# Patient Record
Sex: Male | Born: 1955 | Race: White | Hispanic: No | Smoking: Current every day smoker
Health system: Southern US, Community
[De-identification: ages and names within clinical notes are randomized; demographics above are authoritative.]

## PROBLEM LIST (undated history)

## (undated) DIAGNOSIS — B1920 Unspecified viral hepatitis C without hepatic coma: Secondary | ICD-10-CM

## (undated) DIAGNOSIS — F1020 Alcohol dependence, uncomplicated: Secondary | ICD-10-CM

## (undated) DIAGNOSIS — F32A Depression, unspecified: Secondary | ICD-10-CM

## (undated) DIAGNOSIS — B182 Chronic viral hepatitis C: Secondary | ICD-10-CM

## (undated) DIAGNOSIS — J449 Chronic obstructive pulmonary disease, unspecified: Secondary | ICD-10-CM

## (undated) DIAGNOSIS — F329 Major depressive disorder, single episode, unspecified: Secondary | ICD-10-CM

---

## 2003-03-09 ENCOUNTER — Emergency Department (HOSPITAL_COMMUNITY): Admission: EM | Admit: 2003-03-09 | Discharge: 2003-03-09 | Payer: Self-pay | Admitting: Emergency Medicine

## 2008-06-09 ENCOUNTER — Emergency Department (HOSPITAL_COMMUNITY): Admission: EM | Admit: 2008-06-09 | Discharge: 2008-06-09 | Payer: Self-pay | Admitting: Emergency Medicine

## 2011-06-09 LAB — RAPID URINE DRUG SCREEN, HOSP PERFORMED
Amphetamines: NOT DETECTED
Barbiturates: NOT DETECTED
Benzodiazepines: NOT DETECTED
Cocaine: POSITIVE — AB

## 2011-06-09 LAB — ETHANOL: Alcohol, Ethyl (B): <5

## 2011-06-09 LAB — CBC
MCHC: 33.8
MCV: 94.6
Platelets: 195

## 2011-06-09 LAB — BASIC METABOLIC PANEL
BUN: 6
CO2: 27
Calcium: 9.5
Chloride: 100
Creatinine, Ser: 0.79

## 2012-10-07 ENCOUNTER — Emergency Department (HOSPITAL_COMMUNITY)
Admission: EM | Admit: 2012-10-07 | Discharge: 2012-10-07 | Disposition: A | Discharge status: Home / Self Care | Payer: Self-pay | Attending: Emergency Medicine | Admitting: Emergency Medicine

## 2012-10-07 ENCOUNTER — Encounter (HOSPITAL_COMMUNITY): Payer: Self-pay | Admitting: Emergency Medicine

## 2012-10-07 DIAGNOSIS — Z5900 Homelessness unspecified: Secondary | ICD-10-CM | POA: Insufficient documentation

## 2012-10-07 DIAGNOSIS — M549 Dorsalgia, unspecified: Secondary | ICD-10-CM

## 2012-10-07 DIAGNOSIS — M62838 Other muscle spasm: Secondary | ICD-10-CM

## 2012-10-07 DIAGNOSIS — F17200 Nicotine dependence, unspecified, uncomplicated: Secondary | ICD-10-CM | POA: Insufficient documentation

## 2012-10-07 DIAGNOSIS — M5380 Other specified dorsopathies, site unspecified: Secondary | ICD-10-CM | POA: Insufficient documentation

## 2012-10-07 MED ORDER — OXYCODONE-ACETAMINOPHEN 5-325 MG PO TABS
2.0000 | ORAL_TABLET | Freq: Once | ORAL | Status: AC
  Administered 2012-10-07: 2 via ORAL
  Filled 2012-10-07: qty 2

## 2012-10-07 MED ORDER — METHOCARBAMOL 500 MG PO TABS: 500.0000 mg | ORAL_TABLET | Freq: Two times a day (BID) | ORAL | Status: DC

## 2012-10-07 MED ORDER — HYDROCODONE-ACETAMINOPHEN 5-325 MG PO TABS
2.0000 | ORAL_TABLET | Freq: Four times a day (QID) | ORAL | Status: DC | PRN
Start: 2012-10-07 — End: 2012-10-15

## 2012-10-07 NOTE — ED Notes (Signed)
Per EMS, was moving logs last night and now has lower back pain, radiating to right leg

## 2012-10-07 NOTE — ED Provider Notes (Signed)
History     CSN: [id number]  Arrival date & time 10/07/12  1032   First MD Initiated Contact with Patient 10/07/12 1057      Chief Complaint  Patient presents with  . Back Pain    (Consider location/radiation/quality/duration/timing/severity/associated sxs/prior treatment) HPI Comments: Is a 57 year old male, no known past medical history, who presents to the emergency department with chief complaint of back pain. Patient is homeless, and states that he was lifting logs last night to maintain a camp fire. States that he was intoxicated at that time and that he hurt his back while lifting the logs. States that he awoke this morning with tight lumbar muscles, which are not relieved with anything. States that his pain is moderate to severe. His symptoms are aggravated with movement.  The history is provided by the patient. No language interpreter was used.    History reviewed. No pertinent past medical history.  History reviewed. No pertinent past surgical history.  No family history on file.  History  Substance Use Topics  . Smoking status: Current Every Day Smoker  . Smokeless tobacco: Not on file  . Alcohol Use: Yes      Review of Systems  All other systems reviewed and are negative.    Allergies  Review of patient's allergies indicates no known allergies.  Home Medications   Current Outpatient Rx  Name  Route  Sig  Dispense  Refill  . HYDROCODONE-ACETAMINOPHEN 5-325 MG PO TABS   Oral   Take 2 tablets by mouth every 6 (six) hours as needed for pain.   10 tablet   0   . METHOCARBAMOL 500 MG PO TABS   Oral   Take 1 tablet (500 mg total) by mouth 2 (two) times daily.   20 tablet   0     BP 130/78  Pulse 95  Temp 98.3 F (36.8 C) (Oral)  Resp 18  SpO2 100%  Physical Exam  Nursing note and vitals reviewed. Constitutional: He is oriented to person, place, and time. He appears well-developed and well-nourished.  HENT:  Head: Normocephalic and  atraumatic.  Eyes: Conjunctivae normal and EOM are normal.  Neck: Normal range of motion.  Cardiovascular: Normal rate.   Pulmonary/Chest: Effort normal.  Abdominal: He exhibits no distension.  Musculoskeletal: Normal range of motion.       No spine tenderness, lumbar paraspinal muscles are tender to palpation, no acute deformity, rash, or signs of infection, range of motion is full, gait is appropriate  Neurological: He is alert and oriented to person, place, and time.  Skin: Skin is dry.  Psychiatric: He has a normal mood and affect. His behavior is normal. Judgment and thought content normal.    ED Course  Procedures (including critical care time)  Labs Reviewed - No data to display No results found.   1. Back pain   2. Muscle spasm       MDM  57 year old male with lumbar back strain/muscle spasm. Will treat the patient with some pain pills and Robaxin. Encouraged the patient to ice the affected area. Have given the patient exercises that he can stretch his back with. He does not have any signs of cauda equina. He is ambulatory. I'm going to discharge the patient back to his camp site. Patient understands and agrees with the plan. He is stable and ready for discharge.       Roxy Horseman, PA-C 10/07/12 1226

## 2012-10-07 NOTE — ED Provider Notes (Signed)
Medical screening examination/treatment/procedure(s) were performed by non-physician practitioner and as supervising physician I was immediately available for consultation/collaboration.   Flint Melter, MD 10/07/12 [phone]

## 2012-10-15 ENCOUNTER — Emergency Department (HOSPITAL_COMMUNITY): Payer: Self-pay

## 2012-10-15 ENCOUNTER — Encounter (HOSPITAL_COMMUNITY): Payer: Self-pay | Admitting: Emergency Medicine

## 2012-10-15 ENCOUNTER — Inpatient Hospital Stay (HOSPITAL_COMMUNITY)
Admission: EM | Admit: 2012-10-15 | Discharge: 2012-10-17 | DRG: 195 | Disposition: A | Discharge status: Home / Self Care | Payer: MEDICAID | Attending: Family Medicine | Admitting: Family Medicine

## 2012-10-15 DIAGNOSIS — J189 Pneumonia, unspecified organism: Secondary | ICD-10-CM

## 2012-10-15 DIAGNOSIS — R52 Pain, unspecified: Secondary | ICD-10-CM

## 2012-10-15 DIAGNOSIS — F1020 Alcohol dependence, uncomplicated: Secondary | ICD-10-CM

## 2012-10-15 DIAGNOSIS — Z791 Long term (current) use of non-steroidal anti-inflammatories (NSAID): Secondary | ICD-10-CM

## 2012-10-15 DIAGNOSIS — R0902 Hypoxemia: Secondary | ICD-10-CM

## 2012-10-15 DIAGNOSIS — F17200 Nicotine dependence, unspecified, uncomplicated: Secondary | ICD-10-CM | POA: Diagnosis present

## 2012-10-15 DIAGNOSIS — Z79899 Other long term (current) drug therapy: Secondary | ICD-10-CM

## 2012-10-15 DIAGNOSIS — R222 Localized swelling, mass and lump, trunk: Secondary | ICD-10-CM | POA: Diagnosis present

## 2012-10-15 DIAGNOSIS — Z5900 Homelessness unspecified: Secondary | ICD-10-CM

## 2012-10-15 DIAGNOSIS — F1210 Cannabis abuse, uncomplicated: Secondary | ICD-10-CM | POA: Diagnosis present

## 2012-10-15 DIAGNOSIS — R0789 Other chest pain: Secondary | ICD-10-CM | POA: Diagnosis present

## 2012-10-15 DIAGNOSIS — K5900 Constipation, unspecified: Secondary | ICD-10-CM

## 2012-10-15 DIAGNOSIS — Z720 Tobacco use: Secondary | ICD-10-CM

## 2012-10-15 DIAGNOSIS — F1010 Alcohol abuse, uncomplicated: Secondary | ICD-10-CM

## 2012-10-15 DIAGNOSIS — F1410 Cocaine abuse, uncomplicated: Secondary | ICD-10-CM | POA: Diagnosis present

## 2012-10-15 HISTORY — DX: Alcohol dependence, uncomplicated: F10.20

## 2012-10-15 HISTORY — DX: Pneumonia, unspecified organism: J18.9

## 2012-10-15 LAB — POCT I-STAT, CHEM 8
Calcium, Ion: 1.21 mmol/L (ref 1.12–1.23)
Creatinine, Ser: 0.7 mg/dL (ref 0.50–1.35)
Glucose, Bld: 109 mg/dL — ABNORMAL HIGH (ref 70–99)
Hemoglobin: 17 g/dL (ref 13.0–17.0)
Potassium: 4.6 mEq/L (ref 3.5–5.1)
TCO2: 26 mmol/L (ref 0–100)

## 2012-10-15 LAB — COMPREHENSIVE METABOLIC PANEL
ALT: 101 U/L — ABNORMAL HIGH (ref 0–53)
AST: 73 U/L — ABNORMAL HIGH (ref 0–37)
Albumin: 3.7 g/dL (ref 3.5–5.2)
Alkaline Phosphatase: 84 U/L (ref 39–117)
Chloride: 100 mEq/L (ref 96–112)
Potassium: 4.6 mEq/L (ref 3.5–5.1)
Sodium: 137 mEq/L (ref 135–145)
Total Bilirubin: 0.4 mg/dL (ref 0.3–1.2)

## 2012-10-15 LAB — URINALYSIS, MICROSCOPIC ONLY
Bilirubin Urine: NEGATIVE
Hgb urine dipstick: NEGATIVE
Ketones, ur: NEGATIVE mg/dL
Protein, ur: NEGATIVE mg/dL
Specific Gravity, Urine: 1.034 — ABNORMAL HIGH (ref 1.005–1.030)
Urobilinogen, UA: 1 mg/dL (ref 0.0–1.0)

## 2012-10-15 LAB — CBC
Platelets: 229 10*3/uL (ref 150–400)
RBC: 4.86 MIL/uL (ref 4.22–5.81)
RDW: 13.3 % (ref 11.5–15.5)
WBC: 8.1 10*3/uL (ref 4.0–10.5)

## 2012-10-15 LAB — RAPID URINE DRUG SCREEN, HOSP PERFORMED
Amphetamines: NOT DETECTED
Benzodiazepines: NOT DETECTED
Cocaine: NOT DETECTED
Opiates: NOT DETECTED
Tetrahydrocannabinol: NOT DETECTED

## 2012-10-15 MED ORDER — DOCUSATE SODIUM 100 MG PO CAPS
100.0000 mg | ORAL_CAPSULE | Freq: Two times a day (BID) | ORAL | Status: DC
  Administered 2012-10-15 – 2012-10-17 (×4): 100 mg via ORAL
  Filled 2012-10-15 (×6): qty 1

## 2012-10-15 MED ORDER — IPRATROPIUM BROMIDE 0.02 % IN SOLN: 0.5000 mg | RESPIRATORY_TRACT | Status: DC | PRN

## 2012-10-15 MED ORDER — LEVOFLOXACIN IN D5W 750 MG/150ML IV SOLN
750.0000 mg | Freq: Once | INTRAVENOUS | Status: AC
Start: 2012-10-15 — End: 2012-10-15
  Administered 2012-10-15: 750 mg via INTRAVENOUS
  Filled 2012-10-15: qty 150

## 2012-10-15 MED ORDER — SODIUM CHLORIDE 0.9 % IV SOLN
INTRAVENOUS | Status: DC
  Administered 2012-10-15 – 2012-10-16 (×2): via INTRAVENOUS

## 2012-10-15 MED ORDER — ACETAMINOPHEN 650 MG RE SUPP: 650.0000 mg | Freq: Four times a day (QID) | RECTAL | Status: DC | PRN

## 2012-10-15 MED ORDER — OXYCODONE HCL 5 MG PO TABS
5.0000 mg | ORAL_TABLET | ORAL | Status: DC | PRN
  Administered 2012-10-16 – 2012-10-17 (×2): 5 mg via ORAL
  Filled 2012-10-15 (×2): qty 1

## 2012-10-15 MED ORDER — HYDROMORPHONE HCL PF 1 MG/ML IJ SOLN
0.5000 mg | INTRAMUSCULAR | Status: DC | PRN
  Administered 2012-10-16: 0.5 mg via INTRAVENOUS
  Filled 2012-10-15: qty 1

## 2012-10-15 MED ORDER — LEVOFLOXACIN 750 MG PO TABS
750.0000 mg | ORAL_TABLET | Freq: Every day | ORAL | Status: DC
  Filled 2012-10-15: qty 1

## 2012-10-15 MED ORDER — ALBUTEROL SULFATE (5 MG/ML) 0.5% IN NEBU
5.0000 mg | INHALATION_SOLUTION | Freq: Once | RESPIRATORY_TRACT | Status: AC
  Administered 2012-10-15: 5 mg via RESPIRATORY_TRACT
  Filled 2012-10-15: qty 1

## 2012-10-15 MED ORDER — LORAZEPAM 2 MG/ML IJ SOLN: 1.0000 mg | Freq: Four times a day (QID) | INTRAMUSCULAR | Status: DC | PRN

## 2012-10-15 MED ORDER — THIAMINE HCL 100 MG/ML IJ SOLN
100.0000 mg | Freq: Every day | INTRAMUSCULAR | Status: DC
  Filled 2012-10-15 (×3): qty 1

## 2012-10-15 MED ORDER — ACETAMINOPHEN 325 MG PO TABS
650.0000 mg | ORAL_TABLET | Freq: Four times a day (QID) | ORAL | Status: DC | PRN
  Administered 2012-10-16 – 2012-10-17 (×2): 650 mg via ORAL
  Filled 2012-10-15 (×2): qty 2

## 2012-10-15 MED ORDER — FOLIC ACID 1 MG PO TABS
1.0000 mg | ORAL_TABLET | Freq: Every day | ORAL | Status: DC
  Administered 2012-10-15 – 2012-10-17 (×3): 1 mg via ORAL
  Filled 2012-10-15 (×3): qty 1

## 2012-10-15 MED ORDER — VITAMIN B-1 100 MG PO TABS
100.0000 mg | ORAL_TABLET | Freq: Every day | ORAL | Status: DC
  Administered 2012-10-15 – 2012-10-17 (×3): 100 mg via ORAL
  Filled 2012-10-15 (×3): qty 1

## 2012-10-15 MED ORDER — ADULT MULTIVITAMIN W/MINERALS CH
1.0000 | ORAL_TABLET | Freq: Every day | ORAL | Status: DC
  Administered 2012-10-15 – 2012-10-17 (×3): 1 via ORAL
  Filled 2012-10-15 (×3): qty 1

## 2012-10-15 MED ORDER — LORAZEPAM 1 MG PO TABS
1.0000 mg | ORAL_TABLET | Freq: Four times a day (QID) | ORAL | Status: DC | PRN
  Administered 2012-10-15 – 2012-10-16 (×2): 1 mg via ORAL
  Filled 2012-10-15 (×2): qty 1

## 2012-10-15 MED ORDER — SENNA 8.6 MG PO TABS
1.0000 | ORAL_TABLET | Freq: Two times a day (BID) | ORAL | Status: DC
  Administered 2012-10-15 – 2012-10-16 (×2): 8.6 mg via ORAL
  Filled 2012-10-15 (×3): qty 1

## 2012-10-15 MED ORDER — IOHEXOL 350 MG/ML SOLN
100.0000 mL | Freq: Once | INTRAVENOUS | Status: AC | PRN
  Administered 2012-10-15: 100 mL via INTRAVENOUS

## 2012-10-15 MED ORDER — ENOXAPARIN SODIUM 40 MG/0.4ML ~~LOC~~ SOLN
40.0000 mg | SUBCUTANEOUS | Status: DC
  Administered 2012-10-15 – 2012-10-16 (×2): 40 mg via SUBCUTANEOUS
  Filled 2012-10-15 (×3): qty 0.4

## 2012-10-15 MED ORDER — METHYLPREDNISOLONE SODIUM SUCC 125 MG IJ SOLR
125.0000 mg | Freq: Once | INTRAMUSCULAR | Status: AC
  Administered 2012-10-15: 125 mg via INTRAVENOUS
  Filled 2012-10-15: qty 2

## 2012-10-15 MED ORDER — HYDROMORPHONE HCL PF 1 MG/ML IJ SOLN
1.0000 mg | INTRAMUSCULAR | Status: DC | PRN
  Administered 2012-10-15: 1 mg via INTRAVENOUS
  Filled 2012-10-15: qty 1

## 2012-10-15 MED ORDER — HYDROMORPHONE HCL PF 1 MG/ML IJ SOLN: 1.0000 mg | INTRAMUSCULAR | Status: DC | PRN

## 2012-10-15 MED ORDER — ALBUTEROL SULFATE (5 MG/ML) 0.5% IN NEBU: 2.5000 mg | INHALATION_SOLUTION | RESPIRATORY_TRACT | Status: DC | PRN

## 2012-10-15 MED ORDER — CYCLOBENZAPRINE HCL 10 MG PO TABS
10.0000 mg | ORAL_TABLET | Freq: Three times a day (TID) | ORAL | Status: DC | PRN
  Filled 2012-10-15: qty 1

## 2012-10-15 MED ORDER — LEVOFLOXACIN 750 MG PO TABS
750.0000 mg | ORAL_TABLET | ORAL | Status: DC
  Administered 2012-10-16 – 2012-10-17 (×2): 750 mg via ORAL
  Filled 2012-10-15 (×3): qty 1

## 2012-10-15 NOTE — ED Notes (Signed)
Bed:WA04<BR> Expected date:<BR> Expected time:<BR> Means of arrival:<BR> Comments:<BR>

## 2012-10-15 NOTE — ED Notes (Signed)
Attempted to call report to floor.  RN unable to take report.  

## 2012-10-15 NOTE — ED Notes (Signed)
Pt moved to room 4.

## 2012-10-15 NOTE — ED Provider Notes (Signed)
History     CSN: [id number]  Arrival date & time 10/15/12  0156   First MD Initiated Contact with Patient 10/15/12 0559      No chief complaint on file.   (Consider location/radiation/quality/duration/timing/severity/associated sxs/prior treatment) HPI HX per PT, R sided flank/ back pain with SOB, cough and wheezing. Onset a week ago and persistent despite flexeril.  No CP. No F/C, no rash, mod to severe non radiating pain. Somewhat better with rest.  No past medical history on file.  No past surgical history on file.  No family history on file.  History  Substance Use Topics  . Smoking status: Current Every Day Smoker  . Smokeless tobacco: Not on file  . Alcohol Use: Yes      Review of Systems  Constitutional: Negative for fever and chills.  HENT: Negative for neck pain and neck stiffness.   Eyes: Negative for pain.  Respiratory: Positive for shortness of breath.   Cardiovascular: Negative for chest pain and leg swelling.  Gastrointestinal: Negative for vomiting and abdominal pain.  Genitourinary: Negative for dysuria.  Musculoskeletal: Positive for back pain.  Skin: Negative for rash.  Neurological: Negative for headaches.  All other systems reviewed and are negative.    Allergies  Review of patient's allergies indicates no known allergies.  Home Medications   Current Outpatient Rx  Name  Route  Sig  Dispense  Refill  . HYDROcodone-acetaminophen (NORCO/VICODIN) 5-325 MG per tablet   Oral   Take 2 tablets by mouth every 6 (six) hours as needed for pain.   10 tablet   0   . methocarbamol (ROBAXIN) 500 MG tablet   Oral   Take 1 tablet (500 mg total) by mouth 2 (two) times daily.   20 tablet   0     There were no vitals taken for this visit.  Physical Exam  Constitutional: He is oriented to person, place, and time. He appears well-developed and well-nourished.  HENT:  Head: Normocephalic and atraumatic.  Eyes: EOM are normal. Pupils are equal,  round, and reactive to light.  Neck: Neck supple.  Cardiovascular: Regular rhythm and intact distal pulses.   tachycardic  Pulmonary/Chest: Effort normal. No respiratory distress.  bilat exp wheezes and mild tachypnea  Abdominal: Soft. Bowel sounds are normal. He exhibits no distension. There is no tenderness.  Musculoskeletal: Normal range of motion. He exhibits no edema.  Neurological: He is alert and oriented to person, place, and time.  Skin: Skin is warm and dry.    ED Course  Procedures (including critical care time)  Results for orders placed during the hospital encounter of 10/15/12  CBC      Result Value Range   WBC 8.1  4.0 - 10.5 K/uL   RBC 4.86  4.22 - 5.81 MIL/uL   Hemoglobin 16.2  13.0 - 17.0 g/dL   HCT 78.2  95.6 - 21.3 %   MCV 94.2  78.0 - 100.0 fL   MCH 33.3  26.0 - 34.0 pg   MCHC 35.4  30.0 - 36.0 g/dL   RDW 08.6  57.8 - 46.9 %   Platelets 229  150 - 400 K/uL  POCT I-STAT, CHEM 8      Result Value Range   Sodium 141  135 - 145 mEq/L   Potassium 4.6  3.5 - 5.1 mEq/L   Chloride 109  96 - 112 mEq/L   BUN 13  6 - 23 mg/dL   Creatinine, Ser 6.29  0.50 -  1.35 mg/dL   Glucose, Bld 161 (*) 70 - 99 mg/dL   Calcium, Ion 0.96  0.45 - 1.23 mmol/L   TCO2 26  0 - 100 mmol/L   Hemoglobin 17.0  13.0 - 17.0 g/dL   HCT 40.9  81.1 - 91.4 %   RLL infilt versus mass on Ct A/P - sent for CT chest with contrast to further evaluate.     1. CAP (community acquired pneumonia)   2. Hypoxia    Pulse improved with supp O2  IV ABx, steroids and albuterol. Iv Dilaudid pain control  8:15 AM MED c/s DR Tat to evaluate for admit  MDM  Hypoxia, pain and wheezing with infiltrate on imaging, treated for CAP, plan MED admit        Sunnie Nielsen, MD 10/15/12 [phone]

## 2012-10-15 NOTE — H&P (Addendum)
[facility] History and Physical  JOAQUIM TOLEN VHQ:[id number] DOB: 1956-07-31 DOA: 10/15/2012   PCP: No primary provider on file.   Chief Complaint: right flank pain, cough  HPI:  57 year old male with a history of homelessness, polysubstance abuse including tobacco, alcohol, cocaine, and cannabis presents to the hospital with a two-day history of worsening right flank, posterior rib pain. The patient came to the emergency department on 10/07/2012 with right lower back pain in the sacroiliac region. He was discharged with Flexeril and some type of opioid. He was only able to get the Flexeril. Since that time, the patient stated that his right sacroiliac pain has improved , but has developed a new right posterior rib/flank pain. He denies any recent injuries or trauma. Patient also complains of a one-week history of coughing with occasional white sputum. He denies any hemoptysis. He has had some intermittent shortness of breath, but denies any chest discomfort, dizziness, syncope. He denies any fevers, chills, headache, visual loss, nausea, vomiting, abdominal pain, diarrhea, dysuria, hematuria. In the emergency department, CT of the abdomen and pelvis was obtained and showed right lower lobe consolidation with air bronchograms with consideration of possible mass. It also showed prominent stool and gas. A followup CT angiogram of the chest was obtained which was negative for pulmonary embolus, but showed right lower lobe opacity/consolidation with occlusion of the segmental bronchus, favored by secretions. There was no mediastinal lymphadenopathy. The patient was given levofloxacin, Solu-Medrol, and albuterol. Assessment/Plan: Community acquired pneumonia -CURB65 score is zero -continue levofloxacin -Urine Legionella antigen, urine Streptococcus pneumoniae antigen -IV fluids -Blood cultures were not performed prior to antibiotics being given -Supplemental oxygen--oxygen saturation 91-92% on  room air Uncontrolled pain -Question if pain is related to patient's pneumonitis versus musculoskeletal -No rib injuries or lytic lesions noted on imaging -IV hydromorphone when necessary severe pain -Oxycodone when necessary moderate pain -Check UA and urine culture -Check EKG Polysubstance abuse -Placed the patient on alcohol withdrawal protocol -Drinks 3-8 beers per day x40 years -Urine drug screen Tobacco use -One pack per day x40 years -Cessation discussed -Albuterol and Atrovent when necessary shortness of breath Constipation -Colace and Senokot for now Disposition -Anticipate discharge in 24 hours if medically stable -Will likely need assistance with medications   Past medical history: Denies history of hypertension, diabetes, cancer. No other major chronic medical problems Past surgical history: Left knee arthroscopy   Social History:  reports that he has been smoking.  He does not have any smokeless tobacco history on file. He reports that  drinks alcohol. His drug history is not on file. History of cocaine and cannabis use  Family history: Mother is alive and healthy; father died at age 34 from carbon monoxide poisoning  No Known Allergies    Prior to Admission medications   Medication Sig Start Date End Date Taking? Authorizing Provider  cyclobenzaprine (FLEXERIL) 10 MG tablet Take 10 mg by mouth 3 (three) times daily as needed for muscle spasms.   Yes Historical Provider, MD  ibuprofen (ADVIL,MOTRIN) 200 MG tablet Take 600-800 mg by mouth every 6 (six) hours as needed for pain.   Yes Historical Provider, MD    Review of Systems:  Constitutional:  No weight loss, night sweats, Fevers, chills, fatigue.  Head&Eyes: No headache.  No vision loss.  No eye pain or scotoma ENT:  No Difficulty swallowing,Tooth/dental problems,Sore throat,  No ear ache, post nasal drip,  Cardio-vascular:  No chest pain, Orthopnea, PND, swelling in lower extremities,  dizziness,  palpitations  GI:  No  abdominal pain, nausea, vomiting, diarrhea, loss of appetite, hematochezia, melena, heartburn, indigestion, Resp:  No shortness of breath with exertion or at rest. No coughing up of blood . Skin:  no rash or lesions.  GU:  no dysuria, change in color of urine, no urgency or frequency. No flank pain.  Musculoskeletal:  Complains of low back pain and flank pain on the right Psych:  No change in mood or affect. No depression or anxiety. Neurologic: No headache, no dysesthesia, no focal weakness, no vision loss. No syncope  Physical Exam: Filed Vitals:   10/15/12 0632 10/15/12 0652  BP:  124/75  Pulse:  86  Resp:  21  SpO2: 92% 92%   General:  A&O x 3, NAD, nontoxic, pleasant/cooperative Head/Eye: No conjunctival hemorrhage, no icterus, [place]/AT, No nystagmus ENT:  No icterus,  No thrush, poor dentition, no pharyngeal exudate Neck:  No masses, no lymphadenpathy, no bruits CV:  RRR, no rub, no gallop, no S3 Lung:  Diminished breath sounds in the right base. No wheezes or rhonchi. Good air movement. Abdomen: soft/NT, +BS, nondistended, no peritoneal signs Ext: No cyanosis, No rashes, No petechiae, No lymphangitis, No edema   Labs on Admission:  Basic Metabolic Panel:  Recent Labs Lab 10/15/12 0655 10/15/12 0707  NA 137 141  K 4.6 4.6  CL 100 109  CO2 24  --   GLUCOSE 105* 109*  BUN 12 13  CREATININE 0.57 0.70  CALCIUM 9.3  --    Liver Function Tests:  Recent Labs Lab 10/15/12 0655  AST 73*  ALT 101*  ALKPHOS 84  BILITOT 0.4  PROT 8.3  ALBUMIN 3.7   No results found for this basename: LIPASE, AMYLASE,  in the last 168 hours No results found for this basename: AMMONIA,  in the last 168 hours CBC:  Recent Labs Lab 10/15/12 0655 10/15/12 0707  WBC 8.1  --   HGB 16.2 17.0  HCT 45.8 50.0  MCV 94.2  --   PLT 229  --    Cardiac Enzymes: No results found for this basename: CKTOTAL, CKMB, CKMBINDEX, TROPONINI,  in the last 168  hours BNP: No components found with this basename: POCBNP,  CBG: No results found for this basename: GLUCAP,  in the last 168 hours  Radiological Exams on Admission: Ct Abdomen Pelvis Wo Contrast  10/15/2012  *RADIOLOGY REPORT*  Clinical Data:  Back pain.  CT ABDOMEN AND PELVIS WITHOUT CONTRAST  Technique:  Multidetector CT imaging of the abdomen and pelvis was performed following the standard protocol without intravenous contrast.  Comparison:   None.  Findings:  Focal consolidation or atelectasis in the right lung base with air bronchograms. This measures 3.5 x 1.8 cm. Mild associated pleural thickening.  This may represent focal acute pneumonia or mass lesion and follow up after resolution of acute symptoms is recommended.  The kidneys appear symmetrical in size and shape.  No pyelocaliectasis or ureterectasis.  No renal, ureteral, or bladder stones.  The bladder wall is not thickened.  The unenhanced appearance of the liver, spleen, gallbladder, pancreas, adrenal glands, abdominal aorta, and retroperitoneal lymph nodes are unremarkable except for vascular calcifications. The stomach and small bowel are decompressed.  Mild prominence of gas and stool filled colon suggesting constipation or ileus.  No free air or free fluid in the abdomen.  Pelvis:  Prostate gland is not enlarged but contains calcifications.  No free or loculated pelvic fluid collections.  No evidence of diverticulitis.  The appendix is normal.  Degenerative changes in the lumbar spine.  IMPRESSION: Focal consolidation or mass in the right lung base posteriorly. Follow up after resolution of acute symptoms is recommended.  No renal or ureteral stone or obstruction.  Mild prominence of gas and stool filled colon suggesting constipation or ileus.   Original Report Authenticated By: Burman Nieves, M.D.    Ct Angio Chest Pe W/cm &/or Wo Cm  10/15/2012  *RADIOLOGY REPORT*  Clinical Data: 57 year old male shortness of breath and chest pain.   CT ANGIOGRAPHY CHEST  Technique:  Multidetector CT imaging of the chest using the standard protocol during bolus administration of intravenous contrast. Multiplanar reconstructed images including MIPs were obtained and reviewed to evaluate the vascular anatomy.  Contrast:  100 ml Omnipaque 350.  Comparison: CT abdomen and pelvis without contrast earlier the same day.  Findings: Adequate contrast bolus timing in the pulmonary arterial tree.  Mild respiratory motion artifact.  No central pulmonary artery filling defect. No focal filling defect identified in the pulmonary arterial tree to suggest the presence of acute pulmonary embolism.  Small volume retained secretions in the trachea.  Central airway is otherwise are patent.  Right lower lobe bronchi are occluded and there is a stable right lower lobe medial basal segment peribronchovascular opacity / consolidation.  Superimposed mild dependent atelectasis.  No pericardial or pleural effusion.  No mediastinal lymphadenopathy.  Negative thoracic inlet.  Calcified atherosclerosis of the aorta and its branches.  Stable visible upper abdominal viscera.  No acute osseous abnormality identified.  IMPRESSION: 1. No evidence of acute pulmonary embolus. 2.  Right lower lobe medial basal segment bronchopneumonia. Segmental bronchus occluded, favor by secretions.  Small volume retained secretions also in the trachea.  No mediastinal lymphadenopathy or pleural effusion.   Original Report Authenticated By: Erskine Speed, M.D.     EKG: Independently reviewed. pending    Time spent:70 minutes Code Status:   full Family Communication:   Family at bedside   Zubin Pontillo, DO  [facility] Pager [phone]  If 7PM-7AM, please contact night-coverage [email or web address] Password [facility] 10/15/2012, 8:54 AM

## 2012-10-16 DIAGNOSIS — R52 Pain, unspecified: Secondary | ICD-10-CM

## 2012-10-16 DIAGNOSIS — K5900 Constipation, unspecified: Secondary | ICD-10-CM

## 2012-10-16 LAB — CBC
Hemoglobin: 14.6 g/dL (ref 13.0–17.0)
MCHC: 35.2 g/dL (ref 30.0–36.0)
RDW: 13.3 % (ref 11.5–15.5)

## 2012-10-16 LAB — BASIC METABOLIC PANEL
BUN: 14 mg/dL (ref 6–23)
GFR calc Af Amer: >90 mL/min (ref >90)
GFR calc non Af Amer: >90 mL/min (ref >90)
Potassium: 4.3 mEq/L (ref 3.5–5.1)

## 2012-10-16 NOTE — Progress Notes (Signed)
[facility] PROGRESS NOTE  Michael Patton WJX:[id number] DOB: 02-17-1956 DOA: 10/15/2012 PCP: No primary provider on file.  Assessment/Plan: Community acquired pneumonia  -CURB65 score is zero  -continue levofloxacin  -Urine Legionella antigen pending, urine Streptococcus pneumoniae antigen negative -Blood cultures were not performed prior to antibiotics being given  -Supplemental oxygen--oxygen saturation 91-92% on room air.  Will try to wean to room air as tolerated.  Uncontrolled pain  -Question if pain is related to patient's pneumonitis versus musculoskeletal  -No rib injuries or lytic lesions noted on imaging  -IV hydromorphone when necessary severe pain  -Oxycodone when necessary moderate pain  -Check UA and urine culture  -Check EKG   Polysubstance abuse  -Placed the patient on alcohol withdrawal protocol  -Drinks 3-8 beers per day x40 years  -Urine drug screen   Tobacco use  -One pack per day x40 years  -readdressed cessation -Albuterol and Atrovent when necessary shortness of breath   Constipation  -continue with Colace and Senokot   Disposition  -Once WBC trending down and patient weaned back to room air. Likely in 1-2 days.   Code Status: full Family Communication: No family at bedside.  Disposition Plan:  Please see above   Consultants:  none  Procedures:  none  Antibiotics:  Levaquin  HPI/Subjective: Patient  Objective: Filed Vitals:   10/15/12 1100 10/15/12 1312 10/15/12 2200 10/16/12 0600  BP: 129/84 125/80 151/87 93/58  Pulse: 77 84 99 85  Temp:  98.4 F (36.9 C) 98.6 F (37 C) 98.1 F (36.7 C)  TempSrc:  Oral Oral Oral  Resp: 20 20 20 20   Height:  5\' 8"  (1.727 m)    Weight:  64.411 kg (142 lb)    SpO2: 97% 96% 97% 94%    Intake/Output Summary (Last 24 hours) at 10/16/12 1358 Last data filed at 10/16/12 0900  Gross per 24 hour  Intake   1765 ml  Output    400 ml  Net   1365 ml   Filed Weights   10/15/12 1312   Weight: 64.411 kg (142 lb)    Exam:   General:  Pt in NAD, Alert and Awake  Cardiovascular: RRR, No MRG  Respiratory: course breath sounds diffusely, no wheezes, no increased work of breathing, [place] in place  Abdomen: soft, NT, ND  Data Reviewed: Basic Metabolic Panel:  Recent Labs Lab 10/15/12 0655 10/15/12 0707 10/16/12 0457  NA 137 141 128*  K 4.6 4.6 4.3  CL 100 109 96  CO2 24  --  25  GLUCOSE 105* 109* 113*  BUN 12 13 14   CREATININE 0.57 0.70 0.67  CALCIUM 9.3  --  8.5   Liver Function Tests:  Recent Labs Lab 10/15/12 0655  AST 73*  ALT 101*  ALKPHOS 84  BILITOT 0.4  PROT 8.3  ALBUMIN 3.7   No results found for this basename: LIPASE, AMYLASE,  in the last 168 hours No results found for this basename: AMMONIA,  in the last 168 hours CBC:  Recent Labs Lab 10/15/12 0655 10/15/12 0707 10/16/12 0457  WBC 8.1  --  21.2*  HGB 16.2 17.0 14.6  HCT 45.8 50.0 41.5  MCV 94.2  --  94.1  PLT 229  --  197   Cardiac Enzymes: No results found for this basename: CKTOTAL, CKMB, CKMBINDEX, TROPONINI,  in the last 168 hours BNP (last 3 results) No results found for this basename: PROBNP,  in the last 8760 hours CBG: No results found for this  basename: GLUCAP,  in the last 168 hours  No results found for this or any previous visit (from the past 240 hour(s)).   Studies: Ct Abdomen Pelvis Wo Contrast  10/15/2012  *RADIOLOGY REPORT*  Clinical Data:  Back pain.  CT ABDOMEN AND PELVIS WITHOUT CONTRAST  Technique:  Multidetector CT imaging of the abdomen and pelvis was performed following the standard protocol without intravenous contrast.  Comparison:   None.  Findings:  Focal consolidation or atelectasis in the right lung base with air bronchograms. This measures 3.5 x 1.8 cm. Mild associated pleural thickening.  This may represent focal acute pneumonia or mass lesion and follow up after resolution of acute symptoms is recommended.  The kidneys appear symmetrical in  size and shape.  No pyelocaliectasis or ureterectasis.  No renal, ureteral, or bladder stones.  The bladder wall is not thickened.  The unenhanced appearance of the liver, spleen, gallbladder, pancreas, adrenal glands, abdominal aorta, and retroperitoneal lymph nodes are unremarkable except for vascular calcifications. The stomach and small bowel are decompressed.  Mild prominence of gas and stool filled colon suggesting constipation or ileus.  No free air or free fluid in the abdomen.  Pelvis:  Prostate gland is not enlarged but contains calcifications.  No free or loculated pelvic fluid collections.  No evidence of diverticulitis.  The appendix is normal.  Degenerative changes in the lumbar spine.  IMPRESSION: Focal consolidation or mass in the right lung base posteriorly. Follow up after resolution of acute symptoms is recommended.  No renal or ureteral stone or obstruction.  Mild prominence of gas and stool filled colon suggesting constipation or ileus.   Original Report Authenticated By: Burman Nieves, M.D.    Ct Angio Chest Pe W/cm &/or Wo Cm  10/15/2012  *RADIOLOGY REPORT*  Clinical Data: 57 year old male shortness of breath and chest pain.  CT ANGIOGRAPHY CHEST  Technique:  Multidetector CT imaging of the chest using the standard protocol during bolus administration of intravenous contrast. Multiplanar reconstructed images including MIPs were obtained and reviewed to evaluate the vascular anatomy.  Contrast:  100 ml Omnipaque 350.  Comparison: CT abdomen and pelvis without contrast earlier the same day.  Findings: Adequate contrast bolus timing in the pulmonary arterial tree.  Mild respiratory motion artifact.  No central pulmonary artery filling defect. No focal filling defect identified in the pulmonary arterial tree to suggest the presence of acute pulmonary embolism.  Small volume retained secretions in the trachea.  Central airway is otherwise are patent.  Right lower lobe bronchi are occluded and  there is a stable right lower lobe medial basal segment peribronchovascular opacity / consolidation.  Superimposed mild dependent atelectasis.  No pericardial or pleural effusion.  No mediastinal lymphadenopathy.  Negative thoracic inlet.  Calcified atherosclerosis of the aorta and its branches.  Stable visible upper abdominal viscera.  No acute osseous abnormality identified.  IMPRESSION: 1. No evidence of acute pulmonary embolus. 2.  Right lower lobe medial basal segment bronchopneumonia. Segmental bronchus occluded, favor by secretions.  Small volume retained secretions also in the trachea.  No mediastinal lymphadenopathy or pleural effusion.   Original Report Authenticated By: Erskine Speed, M.D.     Scheduled Meds: . docusate sodium  100 mg Oral BID  . enoxaparin (LOVENOX) injection  40 mg Subcutaneous Q24H  . folic acid  1 mg Oral Daily  . levofloxacin  750 mg Oral Q24H  . multivitamin with minerals  1 tablet Oral Daily  . senna  1 tablet Oral BID  .  thiamine  100 mg Oral Daily   Or  . thiamine  100 mg Intravenous Daily   Continuous Infusions:   Active Problems:   Uncontrolled pain   Tobacco abuse   Constipation   Alcohol abuse   CAP (community acquired pneumonia)    Time spent: > 35 minutes    Penny Pia  [facility] Pager [phone] If 8PM-8AM, please contact night-coverage at [email or web address], password [facility] 10/16/2012, 1:58 PM  LOS: 1 day

## 2012-10-17 LAB — CBC
MCV: 94.9 fL (ref 78.0–100.0)
Platelets: 195 10*3/uL (ref 150–400)
RBC: 4.7 MIL/uL (ref 4.22–5.81)
WBC: 11.3 10*3/uL — ABNORMAL HIGH (ref 4.0–10.5)

## 2012-10-17 LAB — URINE CULTURE
Colony Count: NO GROWTH
Culture: NO GROWTH

## 2012-10-17 MED ORDER — TRAMADOL HCL 50 MG PO TABS: 50.0000 mg | ORAL_TABLET | Freq: Four times a day (QID) | ORAL | Status: DC | PRN

## 2012-10-17 MED ORDER — LEVOFLOXACIN 750 MG PO TABS: 750.0000 mg | ORAL_TABLET | ORAL | Status: DC

## 2012-10-17 NOTE — Discharge Summary (Signed)
Physician Discharge Summary  Michael Patton JXB:[id number] DOB: 07-22-1956 DOA: 10/15/2012  PCP: No primary provider on file.  Admit date: 10/15/2012 Discharge date: 10/17/2012  Time spent: > 35 minutes  Recommendations for Outpatient Follow-up:  Please be sure to follow up with respiratory status and right back thoracic discomfort  Discharge Diagnoses:  Active Problems:   Uncontrolled pain   Tobacco abuse   Constipation   Alcohol abuse   CAP (community acquired pneumonia)   Discharge Condition: stable  Diet recommendation: regular diet  Filed Weights   10/15/12 1312  Weight: 64.411 kg (142 lb)    History of present illness:  From original HPI:  57 year old male with a history of homelessness, polysubstance abuse including tobacco, alcohol, cocaine, and cannabis presents to the hospital with a two-day history of worsening right flank, posterior rib pain. The patient came to the emergency department on 10/07/2012 with right lower back pain in the sacroiliac region. He was discharged with Flexeril and some type of opioid. He was only able to get the Flexeril. Since that time, the patient stated that his right sacroiliac pain has improved , but has developed a new right posterior rib/flank pain. He denies any recent injuries or trauma. Patient also complains of a one-week history of coughing with occasional white sputum. He denies any hemoptysis. He has had some intermittent shortness of breath, but denies any chest discomfort, dizziness, syncope. He denies any fevers, chills, headache, visual loss, nausea, vomiting, abdominal pain, diarrhea, dysuria, hematuria.  In the emergency department, CT of the abdomen and pelvis was obtained and showed right lower lobe consolidation with air bronchograms with consideration of possible mass. It also showed prominent stool and gas. A followup CT angiogram of the chest was obtained which was negative for pulmonary embolus, but showed right lower lobe  opacity/consolidation with occlusion of the segmental bronchus, favored by secretions. There was no mediastinal lymphadenopathy. The patient was given levofloxacin, Solu-Medrol, and albuterol.   Hospital Course:  Community acquired pneumonia  -CURB65 score is zero  -continue levofloxacin for the next 3 days to complete a 6 day antibiotic course -Urine Legionella antigen negative, urine Streptococcus pneumoniae antigen negative  -Blood cultures were not performed prior to antibiotics being given  - Patient weaned to room air and breathing comfortably on day of discharge.  Uncontrolled pain  -Question if pain is related to patient's pneumonitis versus musculoskeletal  -No rib injuries or lytic lesions noted on imaging  -Will provide prescription for tramadol as outpatient  Polysubstance abuse  -Placed the patient on alcohol withdrawal protocol  -Drinks 3-8 beers per day x40 years  -Urine drug screen  - no anxiety on exam or day of discharge.  Tobacco use  -One pack per day x40 years  -readdressed cessation   Constipation  - no complaints on day of discharge was place on stool softeners while in house.  Procedures:  none  Consultations:  none  Discharge Exam: Filed Vitals:   10/16/12 1512 10/16/12 1633 10/16/12 2216 10/17/12 0510  BP: 109/67  106/70 124/80  Pulse: 76 74 76 73  Temp: 99.5 F (37.5 C)  97.9 F (36.6 C) 97.6 F (36.4 C)  TempSrc: Oral  Oral Oral  Resp: 20  20 20   Height:      Weight:      SpO2: 94% 95% 93% 93%    General: Pt in NAD, Alert and Awake Cardiovascular: RRR, No MRG Respiratory: CTA BL, no wheezes  Discharge Instructions  Discharge Orders  Future Orders Complete By Expires     Call MD for:  redness, tenderness, or signs of infection (pain, swelling, redness, odor or green/yellow discharge around incision site)  As directed     Call MD for:  severe uncontrolled pain  As directed     Call MD for:  temperature >100.4  As directed      Diet - low sodium heart healthy  As directed     Discharge instructions  As directed     Comments:      Please be sure to take your antibiotic medication as indicated.  F/u with your primary care physician in 1-2 weeks or sooner.    Increase activity slowly  As directed         Medication List    TAKE these medications       cyclobenzaprine 10 MG tablet  Commonly known as:  FLEXERIL  Take 10 mg by mouth 3 (three) times daily as needed for muscle spasms.     ibuprofen 200 MG tablet  Commonly known as:  ADVIL,MOTRIN  Take 600-800 mg by mouth every 6 (six) hours as needed for pain.     levofloxacin 750 MG tablet  Commonly known as:  LEVAQUIN  Take 1 tablet (750 mg total) by mouth daily.          The results of significant diagnostics from this hospitalization (including imaging, microbiology, ancillary and laboratory) are listed below for reference.    Significant Diagnostic Studies: Ct Abdomen Pelvis Wo Contrast  10/15/2012  *RADIOLOGY REPORT*  Clinical Data:  Back pain.  CT ABDOMEN AND PELVIS WITHOUT CONTRAST  Technique:  Multidetector CT imaging of the abdomen and pelvis was performed following the standard protocol without intravenous contrast.  Comparison:   None.  Findings:  Focal consolidation or atelectasis in the right lung base with air bronchograms. This measures 3.5 x 1.8 cm. Mild associated pleural thickening.  This may represent focal acute pneumonia or mass lesion and follow up after resolution of acute symptoms is recommended.  The kidneys appear symmetrical in size and shape.  No pyelocaliectasis or ureterectasis.  No renal, ureteral, or bladder stones.  The bladder wall is not thickened.  The unenhanced appearance of the liver, spleen, gallbladder, pancreas, adrenal glands, abdominal aorta, and retroperitoneal lymph nodes are unremarkable except for vascular calcifications. The stomach and small bowel are decompressed.  Mild prominence of gas and stool filled colon  suggesting constipation or ileus.  No free air or free fluid in the abdomen.  Pelvis:  Prostate gland is not enlarged but contains calcifications.  No free or loculated pelvic fluid collections.  No evidence of diverticulitis.  The appendix is normal.  Degenerative changes in the lumbar spine.  IMPRESSION: Focal consolidation or mass in the right lung base posteriorly. Follow up after resolution of acute symptoms is recommended.  No renal or ureteral stone or obstruction.  Mild prominence of gas and stool filled colon suggesting constipation or ileus.   Original Report Authenticated By: Burman Nieves, M.D.    Ct Angio Chest Pe W/cm &/or Wo Cm  10/15/2012  *RADIOLOGY REPORT*  Clinical Data: 57 year old male shortness of breath and chest pain.  CT ANGIOGRAPHY CHEST  Technique:  Multidetector CT imaging of the chest using the standard protocol during bolus administration of intravenous contrast. Multiplanar reconstructed images including MIPs were obtained and reviewed to evaluate the vascular anatomy.  Contrast:  100 ml Omnipaque 350.  Comparison: CT abdomen and pelvis without contrast earlier the  same day.  Findings: Adequate contrast bolus timing in the pulmonary arterial tree.  Mild respiratory motion artifact.  No central pulmonary artery filling defect. No focal filling defect identified in the pulmonary arterial tree to suggest the presence of acute pulmonary embolism.  Small volume retained secretions in the trachea.  Central airway is otherwise are patent.  Right lower lobe bronchi are occluded and there is a stable right lower lobe medial basal segment peribronchovascular opacity / consolidation.  Superimposed mild dependent atelectasis.  No pericardial or pleural effusion.  No mediastinal lymphadenopathy.  Negative thoracic inlet.  Calcified atherosclerosis of the aorta and its branches.  Stable visible upper abdominal viscera.  No acute osseous abnormality identified.  IMPRESSION: 1. No evidence of  acute pulmonary embolus. 2.  Right lower lobe medial basal segment bronchopneumonia. Segmental bronchus occluded, favor by secretions.  Small volume retained secretions also in the trachea.  No mediastinal lymphadenopathy or pleural effusion.   Original Report Authenticated By: Erskine Speed, M.D.     Microbiology: Recent Results (from the past 240 hour(s))  URINE CULTURE     Status: None   Collection Time    10/15/12  4:10 PM      Result Value Range Status   Specimen Description URINE, CLEAN CATCH   Final   Special Requests NONE   Final   Culture  Setup Time 10/15/2012 23:33   Final   Colony Count NO GROWTH   Final   Culture NO GROWTH   Final   Report Status 10/17/2012 FINAL   Final     Labs: Basic Metabolic Panel:  Recent Labs Lab 10/15/12 0655 10/15/12 0707 10/16/12 0457  NA 137 141 128*  K 4.6 4.6 4.3  CL 100 109 96  CO2 24  --  25  GLUCOSE 105* 109* 113*  BUN 12 13 14   CREATININE 0.57 0.70 0.67  CALCIUM 9.3  --  8.5   Liver Function Tests:  Recent Labs Lab 10/15/12 0655  AST 73*  ALT 101*  ALKPHOS 84  BILITOT 0.4  PROT 8.3  ALBUMIN 3.7   No results found for this basename: LIPASE, AMYLASE,  in the last 168 hours No results found for this basename: AMMONIA,  in the last 168 hours CBC:  Recent Labs Lab 10/15/12 0655 10/15/12 0707 10/16/12 0457 10/17/12 0505  WBC 8.1  --  21.2* 11.3*  HGB 16.2 17.0 14.6 15.6  HCT 45.8 50.0 41.5 44.6  MCV 94.2  --  94.1 94.9  PLT 229  --  197 195   Cardiac Enzymes: No results found for this basename: CKTOTAL, CKMB, CKMBINDEX, TROPONINI,  in the last 168 hours BNP: BNP (last 3 results) No results found for this basename: PROBNP,  in the last 8760 hours CBG: No results found for this basename: GLUCAP,  in the last 168 hours     Signed:  Penny Pia  [facility] 10/17/2012, 11:45 AM

## 2012-10-17 NOTE — Progress Notes (Signed)
Patient discharged to home.  Reviewed discharge instructions and precsriptions with patient.  No further questions at this time.  Patient seen by case mangement to help with obtaining medications.  Patient given bus pass.  IV removed from left hand.  Patient escorted to lobby by nurse tech via wheelchair.  Patient discharged.

## 2012-10-17 NOTE — Progress Notes (Signed)
Notified Becky Sax, CSW that patient needs a bus pass

## 2013-03-15 ENCOUNTER — Encounter (HOSPITAL_COMMUNITY): Payer: Self-pay | Admitting: *Deleted

## 2013-03-15 ENCOUNTER — Emergency Department (HOSPITAL_COMMUNITY)
Admission: EM | Admit: 2013-03-15 | Discharge: 2013-03-16 | Disposition: A | Discharge status: Rehabilitation Facility | Payer: Self-pay | Attending: Emergency Medicine | Admitting: Emergency Medicine

## 2013-03-15 DIAGNOSIS — F1010 Alcohol abuse, uncomplicated: Secondary | ICD-10-CM

## 2013-03-15 DIAGNOSIS — F1210 Cannabis abuse, uncomplicated: Secondary | ICD-10-CM | POA: Insufficient documentation

## 2013-03-15 DIAGNOSIS — F1310 Sedative, hypnotic or anxiolytic abuse, uncomplicated: Secondary | ICD-10-CM | POA: Insufficient documentation

## 2013-03-15 DIAGNOSIS — F1110 Opioid abuse, uncomplicated: Secondary | ICD-10-CM | POA: Insufficient documentation

## 2013-03-15 DIAGNOSIS — F1020 Alcohol dependence, uncomplicated: Secondary | ICD-10-CM | POA: Insufficient documentation

## 2013-03-15 DIAGNOSIS — F17200 Nicotine dependence, unspecified, uncomplicated: Secondary | ICD-10-CM | POA: Insufficient documentation

## 2013-03-15 DIAGNOSIS — R000 Tachycardia, unspecified: Secondary | ICD-10-CM | POA: Insufficient documentation

## 2013-03-15 HISTORY — DX: Alcohol dependence, uncomplicated: F10.20

## 2013-03-15 HISTORY — DX: Depression, unspecified: F32.A

## 2013-03-15 HISTORY — DX: Major depressive disorder, single episode, unspecified: F32.9

## 2013-03-15 LAB — RAPID URINE DRUG SCREEN, HOSP PERFORMED
Benzodiazepines: NOT DETECTED
Cocaine: NOT DETECTED
Opiates: NOT DETECTED

## 2013-03-15 LAB — COMPREHENSIVE METABOLIC PANEL
ALT: 82 U/L — ABNORMAL HIGH (ref 0–53)
Calcium: 9.3 mg/dL (ref 8.4–10.5)
Creatinine, Ser: 0.69 mg/dL (ref 0.50–1.35)
GFR calc Af Amer: >90 mL/min (ref >90)
Glucose, Bld: 81 mg/dL (ref 70–99)
Sodium: 139 mEq/L (ref 135–145)
Total Protein: 8.7 g/dL — ABNORMAL HIGH (ref 6.0–8.3)

## 2013-03-15 LAB — CBC
Hemoglobin: 15.9 g/dL (ref 13.0–17.0)
MCH: 33.2 pg (ref 26.0–34.0)
MCHC: 35.3 g/dL (ref 30.0–36.0)
MCV: 94.2 fL (ref 78.0–100.0)

## 2013-03-15 LAB — ETHANOL: Alcohol, Ethyl (B): 222 mg/dL — ABNORMAL HIGH (ref 0–11)

## 2013-03-15 MED ORDER — ONDANSETRON HCL 4 MG PO TABS: 4.0000 mg | ORAL_TABLET | Freq: Three times a day (TID) | ORAL | Status: DC | PRN

## 2013-03-15 MED ORDER — LORAZEPAM 2 MG/ML IJ SOLN: 1.0000 mg | Freq: Four times a day (QID) | INTRAMUSCULAR | Status: DC | PRN

## 2013-03-15 MED ORDER — LORAZEPAM 1 MG PO TABS
0.0000 mg | ORAL_TABLET | Freq: Four times a day (QID) | ORAL | Status: DC
  Administered 2013-03-15: 1 mg via ORAL
  Filled 2013-03-15: qty 1

## 2013-03-15 MED ORDER — NICOTINE 21 MG/24HR TD PT24
21.0000 mg | MEDICATED_PATCH | Freq: Every day | TRANSDERMAL | Status: DC
  Administered 2013-03-15: 21 mg via TRANSDERMAL
  Filled 2013-03-15: qty 1

## 2013-03-15 MED ORDER — LORAZEPAM 1 MG PO TABS: 1.0000 mg | ORAL_TABLET | Freq: Four times a day (QID) | ORAL | Status: DC | PRN

## 2013-03-15 MED ORDER — LORAZEPAM 1 MG PO TABS: 0.0000 mg | ORAL_TABLET | Freq: Two times a day (BID) | ORAL | Status: DC

## 2013-03-15 MED ORDER — ALUM & MAG HYDROXIDE-SIMETH 200-200-20 MG/5ML PO SUSP: 30.0000 mL | ORAL | Status: DC | PRN

## 2013-03-15 MED ORDER — ADULT MULTIVITAMIN W/MINERALS CH
1.0000 | ORAL_TABLET | Freq: Every day | ORAL | Status: DC
  Administered 2013-03-15: 1 via ORAL
  Filled 2013-03-15: qty 1

## 2013-03-15 MED ORDER — IBUPROFEN 200 MG PO TABS: 600.0000 mg | ORAL_TABLET | Freq: Three times a day (TID) | ORAL | Status: DC | PRN

## 2013-03-15 MED ORDER — VITAMIN B-1 100 MG PO TABS
100.0000 mg | ORAL_TABLET | Freq: Every day | ORAL | Status: DC
  Administered 2013-03-15: 100 mg via ORAL
  Filled 2013-03-15: qty 1

## 2013-03-15 MED ORDER — FOLIC ACID 1 MG PO TABS
1.0000 mg | ORAL_TABLET | Freq: Every day | ORAL | Status: DC
  Administered 2013-03-15: 1 mg via ORAL
  Filled 2013-03-15: qty 1

## 2013-03-15 MED ORDER — THIAMINE HCL 100 MG/ML IJ SOLN: 100.0000 mg | Freq: Every day | INTRAMUSCULAR | Status: DC

## 2013-03-15 NOTE — ED Notes (Signed)
Patient resting quietly with eyes closed. Respirations even and unlabored. No distress noted . Q 15 minute check continues as ordered to maintain safety. 

## 2013-03-15 NOTE — ED Notes (Signed)
Patient sat in bed during this assessment. He reported feeling better and waiting to be transfered somewhere from here. He appeared calm although looked flat and depressed. Writer encouraged and supported patient. Q 15 minute check continues as ordered to maintain safety.

## 2013-03-15 NOTE — ED Provider Notes (Signed)
History    This chart was scribed for non-physician practitioner, Renne Crigler, PA-C, working with Sunnie Nielsen, MD by Donne Anon, ED Scribe. This patient was seen in room [facility]2/[facility] and the patient's care was started at 1633.  CSN: [id number] Arrival date & time 03/15/13  1532  First MD Initiated Contact with Patient 03/15/13 1633     Chief Complaint  Patient presents with  . Medical Clearance    The history is provided by the patient. No language interpreter was used.   HPI Comments: Michael Patton is a 57 y.o. male who presents to the Emergency Department needing medical clearance. He has a plan to enter [facility] in [place] for alcohol abuse but needs medical clearance in order to be accepted. His last drink was today. He normally drinks 5-6 40's of beer every day. He drinks liquor and uses marijuana, Valium, and Percocet "whenever he can get it." He took 2 5 mg Valium's today. He recently had a close friend pass away and he was the one who found her body. He denies fever, vomiting, abdominal pain or any other pain. He denies any other health issues. He reports tremors, but denies seizures from previous alcohol detox. He denies SI or hallucinations.   Past Medical History  Diagnosis Date  . Alcoholism    History reviewed. No pertinent past surgical history. History reviewed. No pertinent family history. History  Substance Use Topics  . Smoking status: Current Every Day Smoker -- 1.00 packs/day    Types: Cigarettes  . Smokeless tobacco: Not on file  . Alcohol Use: 30.0 oz/week    50 Cans of beer per week    Review of Systems  Constitutional: Negative for fever.  HENT: Negative for sore throat and rhinorrhea.   Eyes: Negative for redness.  Respiratory: Negative for cough.   Cardiovascular: Negative for chest pain.  Gastrointestinal: Negative for nausea, vomiting, abdominal pain and diarrhea.  Genitourinary: Negative for dysuria.  Musculoskeletal: Negative for  myalgias.  Skin: Negative for rash.  Neurological: Negative for headaches.  Psychiatric/Behavioral: Negative for suicidal ideas and hallucinations.    Allergies  Review of patient's allergies indicates no known allergies.  Home Medications  No current outpatient prescriptions on file.  BP 104/62  Pulse 89  Temp(Src) 98.4 F (36.9 C) (Oral)  Resp 18  SpO2 97%  Physical Exam  Nursing note and vitals reviewed. Constitutional: He appears well-developed and well-nourished. No distress.  HENT:  Head: Normocephalic and atraumatic.  Eyes: Conjunctivae are normal. Right eye exhibits no discharge. Left eye exhibits no discharge.  Neck: Normal range of motion. Neck supple. No tracheal deviation present.  Cardiovascular: Regular rhythm and normal heart sounds.  Tachycardia present.   Pulmonary/Chest: Effort normal and breath sounds normal. No respiratory distress. He has no wheezes. He has no rales.  Abdominal: Soft. Bowel sounds are normal. He exhibits no distension. There is no tenderness.  Musculoskeletal: Normal range of motion.  Neurological: He is alert.  Skin: Skin is warm and dry.  Psychiatric: He has a normal mood and affect. His behavior is normal.    ED Course  Procedures (including critical care time) DIAGNOSTIC STUDIES: Oxygen Saturation is 97% on RA, adequate by my interpretation.    COORDINATION OF CARE: 4:41 PM Discussed treatment plan which includes labs with pt at bedside and pt agreed to plan.    Labs Reviewed  COMPREHENSIVE METABOLIC PANEL - Abnormal; Notable for the following:    Total Protein 8.7 (*)  AST 107 (*)    ALT 82 (*)    All other components within normal limits  ETHANOL - Abnormal; Notable for the following:    Alcohol, Ethyl (B) 222 (*)    All other components within normal limits  CBC  URINE RAPID DRUG SCREEN (HOSP PERFORMED)  ETHANOL   No results found. 1. Alcohol abuse    Patient seen and examined. Work-up initiated. Medications  ordered. Holding orders complete. CIWA initiated.   Vital signs reviewed and are as follows: BP 117/81  Pulse 61  Temp(Src) 97.8 F (36.6 C) (Oral)  Resp 15  SpO2 96%    Spoke with ACT who will see and evaluate.   MDM  Patient pending eval for [facility] detox. No SI. Recent traumatic event. Medically cleared.   I personally performed the services described in this documentation, which was scribed in my presence. The recorded information has been reviewed and is accurate.   Renne Crigler, PA-C 03/16/13 [phone]

## 2013-03-15 NOTE — ED Notes (Signed)
Patient continues resting quietly with eyes closed. Respirations even and unlabored. No distress noted. Q 15 minute check continues as order to maintain safety.

## 2013-03-15 NOTE — BH Assessment (Signed)
Assessment Note   Michael Patton is a 57 y.o. male presenting to [facility] emerg dept for detox from alcohol, crack/cocaine, opiates and THC .  Pt denies SI/HI/AVH.  Pt reports the following: pt consumes 5-6 40's and 1 pint of alcohol, daily, last use was 03/15/13, pt states he drank 4 1/2 40's.  Pt uses approx $20 worth of crack/cocaine, 2-3x's wkly, last use was approx 1 wk ago; uses opiates, 1 pill, 2-3x's.  Pt uses "1 hit" of marijuana wkly, last use was 1 wk ago.  Pt is currently homeless and lives [place] city" in [place].  Pt denies any current legal charges, no issues with seizures/blackouts.  Pt told this Clinical research associate he wants to stop drinking because--"I had a traumatic experience recently, my friend died from drug/alcohol abuse".     Axis I: Depressive D/O; Polysub Dep  Axis II: Deferred Axis III:  Past Medical History  Diagnosis Date  . Alcoholism   . Depression    Axis IV: economic problems, housing problems, occupational problems, other psychosocial or environmental problems, problems related to social environment and problems with primary support group Axis V: 51-60 moderate symptoms  Past Medical History:  Past Medical History  Diagnosis Date  . Alcoholism   . Depression     History reviewed. No pertinent past surgical history.  Family History: History reviewed. No pertinent family history.  Social History:  reports that he has been smoking Cigarettes.  He has been smoking about 1.00 pack per day. He does not have any smokeless tobacco history on file. He reports that he drinks about 30.0 ounces of alcohol per week. He reports that he uses illicit drugs ("Crack" cocaine, Opium, and Marijuana).  Additional Social History:  Alcohol / Drug Use Pain Medications: None  Prescriptions: None  Over the Counter: None  History of alcohol / drug use?: Yes Longest period of sobriety (when/how long): None  Negative Consequences of Use: Work / School;Personal  relationships;Financial Withdrawal Symptoms: Other (Comment) (No current w/d sxs ) Substance #1 Name of Substance 1: Alcohol  1 - Age of First Use: 15 YOM 1 - Amount (size/oz): 5-6 40's; 1 Pint  1 - Frequency: Daily  1 - Duration: On-going  1 - Last Use / Amount: 03/15/13 Substance #2 Name of Substance 2: Crack/Cocaine  2 - Age of First Use: 30's  2 - Amount (size/oz): $20 2 - Frequency: 2-3x's Wkly  2 - Duration: On-going  2 - Last Use / Amount: Last week  Substance #3 Name of Substance 3: Opiates 3 - Age of First Use: 30'S  3 - Amount (size/oz): 1 Pill  3 - Frequency: 2-3x's Wkly  3 - Duration: On-going  3 - Last Use / Amount: Unk  Substance #4 Name of Substance 4: THC  4 - Age of First Use: 15 YOM  4 - Amount (size/oz): "1hit"  4 - Frequency: Wkly  4 - Duration: On-going  4 - Last Use / Amount: Unk   CIWA: CIWA-Ar BP: 117/81 mmHg Pulse Rate: 61 Nausea and Vomiting: no nausea and no vomiting Tactile Disturbances: none Tremor: no tremor Auditory Disturbances: not present Paroxysmal Sweats: no sweat visible Visual Disturbances: not present Anxiety: no anxiety, at ease Headache, Fullness in Head: none present Agitation: normal activity Orientation and Clouding of Sensorium: oriented and can do serial additions CIWA-Ar Total: 0 COWS: Clinical Opiate Withdrawal Scale (COWS) Sweating: No report of chills or flushing Restlessness: Able to sit still Pupil Size: Pupils pinned or normal size  for room light Bone or Joint Aches: Not present Runny Nose or Tearing: Not present GI Upset: No GI symptoms Tremor: No tremor Yawning: No yawning Anxiety or Irritability: None Gooseflesh Skin: Skin is smooth  Allergies: No Known Allergies  Home Medications:  (Not in a hospital admission)  OB/GYN Status:  No LMP for male patient.  General Assessment Data Location of Assessment: [facility] ED Living Arrangements: Other (Comment) (Honmeless--[place] ) Can pt return to  current living arrangement?: Yes Admission Status: Voluntary Is patient capable of signing voluntary admission?: Yes Transfer from: Acute Hospital Referral Source: MD  Education Status Is patient currently in school?: No Current Grade: None  Highest grade of school patient has completed: None  Name of school: eNone  Contact person: None   Risk to self Suicidal Ideation: No Suicidal Intent: No Is patient at risk for suicide?: No Suicidal Plan?: No Access to Means: No What has been your use of drugs/alcohol within the last 12 months?: Abusing: Crack, Alcohol, Opiates, THC  Previous Attempts/Gestures: No How many times?: 0 Other Self Harm Risks: None  Triggers for Past Attempts: None known Intentional Self Injurious Behavior: None Family Suicide History: No Recent stressful life event(s): Other (Comment);Job Loss;Financial Problems (Homeless, Chronic SA ) Persecutory voices/beliefs?: No Depression: Yes Depression Symptoms: Loss of interest in usual pleasures;Feeling worthless/self pity Substance abuse history and/or treatment for substance abuse?: Yes Suicide prevention information given to non-admitted patients: Not applicable  Risk to Others Homicidal Ideation: No Thoughts of Harm to Others: No Current Homicidal Intent: No Current Homicidal Plan: No Access to Homicidal Means: No Identified Victim: None  History of harm to others?: No Assessment of Violence: None Noted Does patient have access to weapons?: No Criminal Charges Pending?: No Does patient have a court date: No  Psychosis Hallucinations: None noted Delusions: None noted  Mental Status Report Appear/Hygiene: Disheveled;Poor hygiene Eye Contact: Good Motor Activity: Unremarkable Speech: Logical/coherent Level of Consciousness: Alert Mood: Sad Affect: Sad Anxiety Level: None Thought Processes: Coherent;Relevant Judgement: Unimpaired Orientation: Person;Place;Time;Situation Obsessive Compulsive  Thoughts/Behaviors: None  Cognitive Functioning Concentration: Normal Memory: Recent Intact;Remote Intact IQ: Average Insight: Fair Impulse Control: Fair Appetite: Good Weight Loss: 0 Weight Gain: 0 Sleep: Decreased Total Hours of Sleep: 4 Vegetative Symptoms: None  ADLScreening [facility] Assessment Services) Patient's cognitive ability adequate to safely complete daily activities?: Yes Patient able to express need for assistance with ADLs?: Yes Independently performs ADLs?: Yes (appropriate for developmental age)  Abuse/Neglect [facility]) Physical Abuse: Denies Verbal Abuse: Denies Sexual Abuse: Denies  Prior Inpatient Therapy Prior Inpatient Therapy: Yes Prior Therapy Dates: "20 yrs ago" Prior Therapy Facilty/Provider(s): [facility]  Reason for Treatment: Detox/Rehab  Prior Outpatient Therapy Prior Outpatient Therapy: No Prior Therapy Dates: None  Prior Therapy Facilty/Provider(s): None  Reason for Treatment: None   ADL Screening (condition at time of admission) Patient's cognitive ability adequate to safely complete daily activities?: Yes Is the patient deaf or have difficulty hearing?: No Does the patient have difficulty seeing, even when wearing glasses/contacts?: No Does the patient have difficulty concentrating, remembering, or making decisions?: No Patient able to express need for assistance with ADLs?: Yes Does the patient have difficulty dressing or bathing?: No Independently performs ADLs?: Yes (appropriate for developmental age) Does the patient have difficulty walking or climbing stairs?: No Weakness of Legs: None Weakness of Arms/Hands: None  Home Assistive Devices/Equipment Home Assistive Devices/Equipment: Eyeglasses  Therapy Consults (therapy consults require a physician order) PT Evaluation Needed: No OT Evalulation Needed: No  SLP Evaluation Needed: No Abuse/Neglect Assessment (Assessment to be complete while patient is alone) Physical Abuse:  Denies Verbal Abuse: Denies Sexual Abuse: Denies Exploitation of patient/patient's resources: Denies Self-Neglect: Denies Values / Beliefs Cultural Requests During Hospitalization: None Spiritual Requests During Hospitalization: None Consults Spiritual Care Consult Needed: No Social Work Consult Needed: No Merchant navy officer (For Healthcare) Advance Directive: Patient does not have advance directive;Patient would not like information Pre-existing out of facility DNR order (yellow form or pink MOST form): No Nutrition Screen- [facility] Adult/[facility]/AP Patient's home diet: Regular  Additional Information 1:1 In Past 12 Months?: No CIRT Risk: No Elopement Risk: No Does patient have medical clearance?: Yes     Disposition:  Disposition Initial Assessment Completed for this Encounter: Yes Disposition of Patient: Inpatient treatment program;Referred to ([facility]) Type of inpatient treatment program: Adult Patient referred to: [facility]  On Site Evaluation by:   Reviewed with Physician:     Murrell Redden 03/15/2013 11:28 PM

## 2013-03-15 NOTE — ED Notes (Signed)
Patient received his HS medications. He had mild tremors. Seemed somewhat anxious about where to go from here. Patient tolerated his medication. Q 15 minute check continues as ordered to maintain safety.

## 2013-03-15 NOTE — ED Notes (Signed)
Pt reports he is here for medical clearance. Pt is trying to get into detox center in [place] and needs medical clearance.

## 2013-03-15 NOTE — ED Notes (Signed)
Patient resting quietly with eyes closed. Respirations even and unlabored, no distress noted. Q 15 minuet check continues as labored to maintain safety.

## 2013-03-16 NOTE — ED Provider Notes (Signed)
Medical screening examination/treatment/procedure(s) were performed by non-physician practitioner and as supervising physician I was immediately available for consultation/collaboration.  Correll Denbow, MD 03/16/13 1716 

## 2013-03-16 NOTE — ED Notes (Addendum)
Pt discharged to [facility].  He was transported by Alinda Money, B., staff from the facility.  Pt denied SI/HI/AVH.

## 2013-03-16 NOTE — ED Notes (Signed)
Patient resting quietly with eyes closed. Respirations even and unlabored. No distress noted . Q 15 minute check continues as ordered to maintain safety. 

## 2013-03-16 NOTE — ED Notes (Signed)
Resting quietly with eyes closed. Respirations even and unlabored. No distress noted.Q 15 minute check continues as ordered to maintain safety. 

## 2017-07-25 ENCOUNTER — Emergency Department (HOSPITAL_COMMUNITY)
Admission: EM | Admit: 2017-07-25 | Discharge: 2017-07-25 | Disposition: A | Discharge status: Home / Self Care | Payer: Medicare Other | Attending: Emergency Medicine | Admitting: Emergency Medicine

## 2017-07-25 ENCOUNTER — Encounter (HOSPITAL_COMMUNITY): Payer: Self-pay

## 2017-07-25 ENCOUNTER — Emergency Department (HOSPITAL_COMMUNITY): Payer: Medicare Other

## 2017-07-25 DIAGNOSIS — J449 Chronic obstructive pulmonary disease, unspecified: Secondary | ICD-10-CM | POA: Diagnosis not present

## 2017-07-25 DIAGNOSIS — Y929 Unspecified place or not applicable: Secondary | ICD-10-CM | POA: Insufficient documentation

## 2017-07-25 DIAGNOSIS — Y9301 Activity, walking, marching and hiking: Secondary | ICD-10-CM | POA: Diagnosis not present

## 2017-07-25 DIAGNOSIS — F17210 Nicotine dependence, cigarettes, uncomplicated: Secondary | ICD-10-CM | POA: Diagnosis not present

## 2017-07-25 DIAGNOSIS — Z23 Encounter for immunization: Secondary | ICD-10-CM | POA: Insufficient documentation

## 2017-07-25 DIAGNOSIS — Y999 Unspecified external cause status: Secondary | ICD-10-CM | POA: Insufficient documentation

## 2017-07-25 DIAGNOSIS — W101XXA Fall (on)(from) sidewalk curb, initial encounter: Secondary | ICD-10-CM | POA: Diagnosis not present

## 2017-07-25 DIAGNOSIS — S01111A Laceration without foreign body of right eyelid and periocular area, initial encounter: Secondary | ICD-10-CM | POA: Insufficient documentation

## 2017-07-25 DIAGNOSIS — F10929 Alcohol use, unspecified with intoxication, unspecified: Secondary | ICD-10-CM

## 2017-07-25 DIAGNOSIS — S0181XA Laceration without foreign body of other part of head, initial encounter: Secondary | ICD-10-CM

## 2017-07-25 DIAGNOSIS — S0990XA Unspecified injury of head, initial encounter: Secondary | ICD-10-CM

## 2017-07-25 HISTORY — DX: Chronic obstructive pulmonary disease, unspecified: J44.9

## 2017-07-25 HISTORY — DX: Unspecified viral hepatitis C without hepatic coma: B19.20

## 2017-07-25 MED ORDER — LIDOCAINE HCL (PF) 1 % IJ SOLN
INTRAMUSCULAR | Status: AC
  Filled 2017-07-25: qty 5

## 2017-07-25 MED ORDER — LIDOCAINE-EPINEPHRINE (PF) 2 %-1:200000 IJ SOLN
20.0000 mL | Freq: Once | INTRAMUSCULAR | Status: AC
  Administered 2017-07-25: 20 mL
  Filled 2017-07-25: qty 20

## 2017-07-25 MED ORDER — TETANUS-DIPHTH-ACELL PERTUSSIS 5-2.5-18.5 LF-MCG/0.5 IM SUSP
0.5000 mL | Freq: Once | INTRAMUSCULAR | Status: AC
  Administered 2017-07-25: 0.5 mL via INTRAMUSCULAR
  Filled 2017-07-25: qty 0.5
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
## 2017-07-25 NOTE — ED Notes (Signed)
Pt understood dc material and follow up info. NAD noted 

## 2017-07-25 NOTE — ED Notes (Signed)
Pt ambulatory with steady gait 

## 2017-08-03 ENCOUNTER — Other Ambulatory Visit: Payer: Self-pay

## 2017-08-03 ENCOUNTER — Emergency Department (HOSPITAL_COMMUNITY)
Admission: EM | Admit: 2017-08-03 | Discharge: 2017-08-03 | Disposition: A | Discharge status: Home / Self Care | Payer: Medicare Other | Attending: Emergency Medicine | Admitting: Emergency Medicine

## 2017-08-03 ENCOUNTER — Encounter (HOSPITAL_COMMUNITY): Payer: Self-pay | Admitting: Emergency Medicine

## 2017-08-03 DIAGNOSIS — R000 Tachycardia, unspecified: Secondary | ICD-10-CM | POA: Diagnosis not present

## 2017-08-03 DIAGNOSIS — Z4802 Encounter for removal of sutures: Secondary | ICD-10-CM | POA: Diagnosis not present

## 2017-08-03 DIAGNOSIS — F17210 Nicotine dependence, cigarettes, uncomplicated: Secondary | ICD-10-CM | POA: Diagnosis not present

## 2017-08-03 DIAGNOSIS — J449 Chronic obstructive pulmonary disease, unspecified: Secondary | ICD-10-CM | POA: Diagnosis not present

## 2017-08-03 LAB — I-STAT CHEM 8, ED
BUN: 12 mg/dL (ref 6–20)
CREATININE: 0.7 mg/dL (ref 0.61–1.24)
Calcium, Ion: 1.1 mmol/L — ABNORMAL LOW (ref 1.15–1.40)
Chloride: 102 mmol/L (ref 101–111)
GLUCOSE: 159 mg/dL — AB (ref 65–99)
HCT: 49 % (ref 39.0–52.0)
HEMOGLOBIN: 16.7 g/dL (ref 13.0–17.0)
Potassium: 3.8 mmol/L (ref 3.5–5.1)
Sodium: 138 mmol/L (ref 135–145)
TCO2: 24 mmol/L (ref 22–32)

## 2017-08-03 NOTE — ED Provider Notes (Signed)
[facility]   CSN: [id number] Arrival date & time: 08/03/17  1128     History   Chief Complaint Chief Complaint  Patient presents with  . Suture / Staple Removal    HPI Olegario ShearerGeorge E Newhall is a 61 y.o. male.  HPI Olegario ShearerGeorge E Escoe is a 61 y.o. male with hx of COPD, alcohol abuse, presents today for suture removal.  Patient states he fell 10 days ago, sustained a laceration to the right face.  It was repaired with sutures.  Patient is here for suture removal.  He states it is healing well, with no drainage, swelling, redness, dehiscence of the wound.  He denies any headaches.  He continues to drink alcohol, denies any alcohol related complaints however.  Past Medical History:  Diagnosis Date  . Alcoholism (HCC)   . COPD (chronic obstructive pulmonary disease) (HCC)   . Depression   . Hepatitis C     Patient Active Problem List   Diagnosis Date Noted  . Uncontrolled pain 10/15/2012  . Tobacco abuse 10/15/2012  . Constipation 10/15/2012  . Alcohol abuse 10/15/2012  . CAP (community acquired pneumonia) 10/15/2012    History reviewed. No pertinent surgical history.     Home Medications    Prior to Admission medications   Not on File    Family History History reviewed. No pertinent family history.  Social History Social History   Tobacco Use  . Smoking status: Current Every Day Smoker    Packs/day: 2.00    Types: Cigarettes  . Smokeless tobacco: Never Used  Substance Use Topics  . Alcohol use: Yes    Alcohol/week: 30.0 oz    Types: 50 Cans of beer per week    Comment: Wkly   . Drug use: No    Comment: Daily      Allergies   Patient has no known allergies.   Review of Systems Review of Systems  Constitutional: Negative for chills and fever.  Respiratory: Negative for chest tightness.   Cardiovascular: Negative for chest pain.  Skin: Positive for wound.  Neurological: Negative for headaches.      Physical Exam Updated Vital Signs BP (!) 140/105   Pulse 78   Temp 97.9 F (36.6 C) (Oral)   Resp 18   SpO2 100%   Physical Exam  Constitutional: He appears well-developed and well-nourished. No distress.  HENT:  Head: Normocephalic and atraumatic.  Healing laceration to the right temple, with extensive scabbing.  Sutures intact.  No wound dehiscence.  No erythema or drainage.  Nontender.  Eyes: Conjunctivae are normal.  Neck: Neck supple.  Cardiovascular: Normal rate, regular rhythm and normal heart sounds.  Pulmonary/Chest: Effort normal. No respiratory distress. He has no wheezes. He has no rales.  Musculoskeletal: He exhibits no edema.  Neurological: He is alert.  Skin: Skin is warm and dry.  Nursing note and vitals reviewed.    ED Treatments / Results  Labs (all labs ordered are listed, but only abnormal results are displayed) Labs Reviewed  I-STAT CHEM 8, ED - Abnormal; Notable for the following components:      Result Value   Glucose, Bld 159 (*)    Calcium, Ion 1.10 (*)    All other components within normal limits    EKG  EKG Interpretation None       Radiology No results found.  Procedures Procedures (including critical care time)  SUTURE REMOVAL Performed by: Jaynie CrumbleKIRICHENKO, Timberly Yott A Consent: Verbal consent obtained.  Patient identity confirmed: provided demographic data Time out: Immediately prior to procedure a "time out" was called to verify the correct patient, procedure, equipment, support staff and site/side marked as required. Location: right temple Wound Appearance: clean Sutures/Staples Removed: 14 Patient tolerance: Patient tolerated the procedure well with no immediate complications.    Medications Ordered in ED Medications - No data to display   Initial Impression / Assessment and Plan / ED Course  I have reviewed the triage vital signs and the nursing notes.  Pertinent labs & imaging results that were available during my  care of the patient were reviewed by me and considered in my medical decision making (see chart for details).     Patient here for suture removal.  He was found to be tachycardic in triage.  EKG and Chem-8 was obtained, unremarkable.  His heart rate is down to 70 just sitting in a stretcher.  He admits to drinking alcohol.  He is in no acute distress however, clinically sober.  Sutures removed.  He is stable for discharge home with wound care and follow-up as needed.  Vitals:   08/03/17 1134 08/03/17 1335 08/03/17 1336  BP: 129/90 (!) 140/105   Pulse: (!) 103  78  Resp: 18    Temp: 97.9 F (36.6 C)    TempSrc: Oral    SpO2: 100%  100%     Final Clinical Impressions(s) / ED Diagnoses   Final diagnoses:  Visit for suture removal    ED Discharge Orders    None       Jaynie CrumbleKirichenko, Brookes Craine, PA-C 08/03/17 1420    Mabe, Latanya MaudlinMartha L, MD 08/03/17 1446

## 2017-08-03 NOTE — ED Triage Notes (Signed)
Pt here for suture removal. Noted to have HR in 130s. Denies chest pain or SOB. EKG done in triage.

## 2017-08-03 NOTE — Discharge Instructions (Signed)
Bacitracin twice a day.  Follow-up as needed.

## 2019-09-08 DIAGNOSIS — R569 Unspecified convulsions: Secondary | ICD-10-CM

## 2019-09-08 HISTORY — DX: Unspecified convulsions: R56.9

## 2020-11-12 ENCOUNTER — Inpatient Hospital Stay (HOSPITAL_COMMUNITY)
Admission: EM | Admit: 2020-11-12 | Discharge: 2020-11-14 | DRG: 641 | Disposition: A | Discharge status: Home / Self Care | Payer: Medicare Other | Attending: Internal Medicine | Admitting: Internal Medicine

## 2020-11-12 ENCOUNTER — Emergency Department (HOSPITAL_COMMUNITY): Payer: Medicare Other

## 2020-11-12 ENCOUNTER — Other Ambulatory Visit: Payer: Self-pay

## 2020-11-12 ENCOUNTER — Inpatient Hospital Stay (HOSPITAL_COMMUNITY): Payer: Medicare Other

## 2020-11-12 ENCOUNTER — Encounter (HOSPITAL_COMMUNITY): Payer: Self-pay

## 2020-11-12 DIAGNOSIS — Z20822 Contact with and (suspected) exposure to covid-19: Secondary | ICD-10-CM | POA: Diagnosis present

## 2020-11-12 DIAGNOSIS — K7031 Alcoholic cirrhosis of liver with ascites: Secondary | ICD-10-CM | POA: Diagnosis present

## 2020-11-12 DIAGNOSIS — E8770 Fluid overload, unspecified: Secondary | ICD-10-CM | POA: Diagnosis present

## 2020-11-12 DIAGNOSIS — Z9114 Patient's other noncompliance with medication regimen: Secondary | ICD-10-CM | POA: Diagnosis not present

## 2020-11-12 DIAGNOSIS — Y903 Blood alcohol level of 60-79 mg/100 ml: Secondary | ICD-10-CM | POA: Diagnosis present

## 2020-11-12 DIAGNOSIS — K7040 Alcoholic hepatic failure without coma: Secondary | ICD-10-CM | POA: Diagnosis present

## 2020-11-12 DIAGNOSIS — D696 Thrombocytopenia, unspecified: Secondary | ICD-10-CM | POA: Diagnosis present

## 2020-11-12 DIAGNOSIS — E871 Hypo-osmolality and hyponatremia: Principal | ICD-10-CM | POA: Diagnosis present

## 2020-11-12 DIAGNOSIS — Z888 Allergy status to other drugs, medicaments and biological substances status: Secondary | ICD-10-CM | POA: Diagnosis not present

## 2020-11-12 DIAGNOSIS — E7220 Disorder of urea cycle metabolism, unspecified: Secondary | ICD-10-CM | POA: Diagnosis not present

## 2020-11-12 DIAGNOSIS — D684 Acquired coagulation factor deficiency: Secondary | ICD-10-CM | POA: Diagnosis present

## 2020-11-12 DIAGNOSIS — J441 Chronic obstructive pulmonary disease with (acute) exacerbation: Secondary | ICD-10-CM | POA: Diagnosis present

## 2020-11-12 DIAGNOSIS — B182 Chronic viral hepatitis C: Secondary | ICD-10-CM | POA: Diagnosis present

## 2020-11-12 DIAGNOSIS — E8809 Other disorders of plasma-protein metabolism, not elsewhere classified: Secondary | ICD-10-CM | POA: Diagnosis present

## 2020-11-12 DIAGNOSIS — F1020 Alcohol dependence, uncomplicated: Secondary | ICD-10-CM | POA: Diagnosis present

## 2020-11-12 DIAGNOSIS — F17210 Nicotine dependence, cigarettes, uncomplicated: Secondary | ICD-10-CM | POA: Diagnosis present

## 2020-11-12 DIAGNOSIS — F1010 Alcohol abuse, uncomplicated: Secondary | ICD-10-CM | POA: Diagnosis present

## 2020-11-12 DIAGNOSIS — F32A Depression, unspecified: Secondary | ICD-10-CM | POA: Diagnosis present

## 2020-11-12 DIAGNOSIS — Z9119 Patient's noncompliance with other medical treatment and regimen: Secondary | ICD-10-CM

## 2020-11-12 DIAGNOSIS — D689 Coagulation defect, unspecified: Secondary | ICD-10-CM | POA: Diagnosis present

## 2020-11-12 HISTORY — PX: IR PARACENTESIS: IMG2679

## 2020-11-12 LAB — BASIC METABOLIC PANEL
Anion gap: 8 (ref 5–15)
BUN: <5 mg/dL — ABNORMAL LOW (ref 8–23)
CO2: 22 mmol/L (ref 22–32)
Calcium: 7.7 mg/dL — ABNORMAL LOW (ref 8.9–10.3)
Chloride: 94 mmol/L — ABNORMAL LOW (ref 98–111)
Creatinine, Ser: 0.57 mg/dL — ABNORMAL LOW (ref 0.61–1.24)
GFR, Estimated: >60 mL/min (ref >60)
Glucose, Bld: 93 mg/dL (ref 70–99)
Potassium: 3.6 mmol/L (ref 3.5–5.1)
Sodium: 124 mmol/L — ABNORMAL LOW (ref 135–145)

## 2020-11-12 LAB — COMPREHENSIVE METABOLIC PANEL
ALT: 30 U/L (ref 0–44)
AST: 94 U/L — ABNORMAL HIGH (ref 15–41)
Albumin: 2.1 g/dL — ABNORMAL LOW (ref 3.5–5.0)
Alkaline Phosphatase: 128 U/L — ABNORMAL HIGH (ref 38–126)
Anion gap: 11 (ref 5–15)
BUN: <5 mg/dL — ABNORMAL LOW (ref 8–23)
CO2: 20 mmol/L — ABNORMAL LOW (ref 22–32)
Calcium: 7.6 mg/dL — ABNORMAL LOW (ref 8.9–10.3)
Chloride: 91 mmol/L — ABNORMAL LOW (ref 98–111)
Creatinine, Ser: 0.63 mg/dL (ref 0.61–1.24)
GFR, Estimated: >60 mL/min (ref >60)
Glucose, Bld: 90 mg/dL (ref 70–99)
Potassium: 3.4 mmol/L — ABNORMAL LOW (ref 3.5–5.1)
Sodium: 122 mmol/L — ABNORMAL LOW (ref 135–145)
Total Bilirubin: 1.9 mg/dL — ABNORMAL HIGH (ref 0.3–1.2)
Total Protein: 9.2 g/dL — ABNORMAL HIGH (ref 6.5–8.1)

## 2020-11-12 LAB — PROTIME-INR
INR: 1.7 — ABNORMAL HIGH (ref 0.8–1.2)
Prothrombin Time: 19.1 seconds — ABNORMAL HIGH (ref 11.4–15.2)

## 2020-11-12 LAB — LACTATE DEHYDROGENASE, PLEURAL OR PERITONEAL FLUID: LD, Fluid: 44 U/L — ABNORMAL HIGH (ref 3–23)

## 2020-11-12 LAB — CBC
HCT: 34.7 % — ABNORMAL LOW (ref 39.0–52.0)
Hemoglobin: 12.3 g/dL — ABNORMAL LOW (ref 13.0–17.0)
MCH: 33 pg (ref 26.0–34.0)
MCHC: 35.4 g/dL (ref 30.0–36.0)
MCV: 93 fL (ref 80.0–100.0)
Platelets: 72 10*3/uL — ABNORMAL LOW (ref 150–400)
RBC: 3.73 MIL/uL — ABNORMAL LOW (ref 4.22–5.81)
RDW: 13.7 % (ref 11.5–15.5)
WBC: 6.9 10*3/uL (ref 4.0–10.5)
nRBC: 0 % (ref 0.0–0.2)

## 2020-11-12 LAB — RESP PANEL BY RT-PCR (FLU A&B, COVID) ARPGX2
Influenza A by PCR: NEGATIVE
Influenza B by PCR: NEGATIVE
SARS Coronavirus 2 by RT PCR: NEGATIVE

## 2020-11-12 LAB — HIV ANTIBODY (ROUTINE TESTING W REFLEX): HIV Screen 4th Generation wRfx: NONREACTIVE

## 2020-11-12 LAB — MAGNESIUM: Magnesium: 1.7 mg/dL (ref 1.7–2.4)

## 2020-11-12 LAB — ETHANOL: Alcohol, Ethyl (B): 70 mg/dL — ABNORMAL HIGH (ref <10)

## 2020-11-12 LAB — ALBUMIN, PLEURAL OR PERITONEAL FLUID: Albumin, Fluid: <1 g/dL

## 2020-11-12 LAB — ACETAMINOPHEN LEVEL: Acetaminophen (Tylenol), Serum: <10 ug/mL — ABNORMAL LOW (ref 10–30)

## 2020-11-12 LAB — PROTEIN, PLEURAL OR PERITONEAL FLUID: Total protein, fluid: <3 g/dL

## 2020-11-12 LAB — TSH: TSH: 1.77 u[IU]/mL (ref 0.350–4.500)

## 2020-11-12 LAB — AMMONIA: Ammonia: 96 umol/L — ABNORMAL HIGH (ref 9–35)

## 2020-11-12 LAB — PHOSPHORUS: Phosphorus: 2.7 mg/dL (ref 2.5–4.6)

## 2020-11-12 MED ORDER — LACTULOSE 10 GM/15ML PO SOLN
30.0000 g | Freq: Two times a day (BID) | ORAL | Status: DC | PRN
  Filled 2020-11-12: qty 45

## 2020-11-12 MED ORDER — SPIRONOLACTONE 25 MG PO TABS
50.0000 mg | ORAL_TABLET | Freq: Every day | ORAL | Status: DC
  Administered 2020-11-12: 50 mg via ORAL
  Filled 2020-11-12 (×2): qty 2

## 2020-11-12 MED ORDER — IPRATROPIUM BROMIDE 0.02 % IN SOLN
0.5000 mg | Freq: Four times a day (QID) | RESPIRATORY_TRACT | Status: DC
  Administered 2020-11-12 – 2020-11-13 (×2): 0.5 mg via RESPIRATORY_TRACT
  Filled 2020-11-12 (×3): qty 2.5

## 2020-11-12 MED ORDER — ADULT MULTIVITAMIN W/MINERALS CH
1.0000 | ORAL_TABLET | Freq: Every day | ORAL | Status: DC
  Administered 2020-11-12 – 2020-11-14 (×3): 1 via ORAL
  Filled 2020-11-12 (×3): qty 1

## 2020-11-12 MED ORDER — LORAZEPAM 1 MG PO TABS
1.0000 mg | ORAL_TABLET | ORAL | Status: DC | PRN
Start: 2020-11-12 — End: 2020-11-14

## 2020-11-12 MED ORDER — THIAMINE HCL 100 MG PO TABS
100.0000 mg | ORAL_TABLET | Freq: Every day | ORAL | Status: DC
  Administered 2020-11-12 – 2020-11-14 (×3): 100 mg via ORAL
  Filled 2020-11-12 (×3): qty 1

## 2020-11-12 MED ORDER — PHYTONADIONE 5 MG PO TABS
5.0000 mg | ORAL_TABLET | Freq: Once | ORAL | Status: AC
  Administered 2020-11-12: 5 mg via ORAL
  Filled 2020-11-12: qty 1

## 2020-11-12 MED ORDER — FUROSEMIDE 20 MG PO TABS: 20.0000 mg | ORAL_TABLET | Freq: Every day | ORAL | Status: DC

## 2020-11-12 MED ORDER — FUROSEMIDE 10 MG/ML IJ SOLN
20.0000 mg | Freq: Every day | INTRAMUSCULAR | Status: DC
  Administered 2020-11-12 – 2020-11-14 (×3): 20 mg via INTRAVENOUS
  Filled 2020-11-12 (×3): qty 2

## 2020-11-12 MED ORDER — THIAMINE HCL 100 MG/ML IJ SOLN
100.0000 mg | Freq: Every day | INTRAMUSCULAR | Status: DC
  Filled 2020-11-12: qty 2

## 2020-11-12 MED ORDER — FOLIC ACID 1 MG PO TABS
1.0000 mg | ORAL_TABLET | Freq: Every day | ORAL | Status: DC
  Administered 2020-11-12 – 2020-11-14 (×3): 1 mg via ORAL
  Filled 2020-11-12 (×3): qty 1

## 2020-11-12 MED ORDER — POTASSIUM CHLORIDE CRYS ER 20 MEQ PO TBCR
40.0000 meq | EXTENDED_RELEASE_TABLET | Freq: Once | ORAL | Status: AC
  Administered 2020-11-12: 40 meq via ORAL
  Filled 2020-11-12: qty 2

## 2020-11-12 MED ORDER — NICOTINE 21 MG/24HR TD PT24
21.0000 mg | MEDICATED_PATCH | Freq: Every day | TRANSDERMAL | Status: DC
  Filled 2020-11-12 (×2): qty 1

## 2020-11-12 MED ORDER — IOHEXOL 300 MG/ML  SOLN
100.0000 mL | Freq: Once | INTRAMUSCULAR | Status: AC | PRN
  Administered 2020-11-12: 100 mL via INTRAVENOUS

## 2020-11-12 MED ORDER — ALBUMIN HUMAN 25 % IV SOLN
25.0000 g | Freq: Four times a day (QID) | INTRAVENOUS | Status: AC
Start: 2020-11-12 — End: 2020-11-13
  Administered 2020-11-12 – 2020-11-13 (×4): 25 g via INTRAVENOUS
  Filled 2020-11-12 (×5): qty 100

## 2020-11-12 MED ORDER — LIDOCAINE HCL 1 % IJ SOLN
INTRAMUSCULAR | Status: AC
  Filled 2020-11-12: qty 20

## 2020-11-12 MED ORDER — SPIRONOLACTONE 25 MG PO TABS
100.0000 mg | ORAL_TABLET | Freq: Every day | ORAL | Status: DC
  Administered 2020-11-13 – 2020-11-14 (×2): 100 mg via ORAL
  Filled 2020-11-12 (×2): qty 4

## 2020-11-12 MED ORDER — LORAZEPAM 2 MG/ML IJ SOLN: 1.0000 mg | INTRAMUSCULAR | Status: DC | PRN

## 2020-11-12 MED ORDER — FLUTICASONE FUROATE-VILANTEROL 100-25 MCG/INH IN AEPB
1.0000 | INHALATION_SPRAY | Freq: Every day | RESPIRATORY_TRACT | Status: DC
  Administered 2020-11-14: 09:00:00 1 via RESPIRATORY_TRACT
  Filled 2020-11-12 (×2): qty 28

## 2020-11-12 NOTE — ED Notes (Signed)
Patient transported to X-ray 

## 2020-11-12 NOTE — ED Triage Notes (Addendum)
Pt reports he is here today due to leg swelling and abd swelling. Pt reports h/o swelling w/ paracentesis as the intervention. Pt reports mild sob.Pt denies any cp.H/o hep c, alcohol abuse.

## 2020-11-12 NOTE — ED Notes (Signed)
Pt returned from IR.

## 2020-11-12 NOTE — ED Notes (Signed)
Patient denies pain and is resting comfortably.  

## 2020-11-12 NOTE — ED Notes (Signed)
Patient transported to IR 

## 2020-11-12 NOTE — Procedures (Signed)
PROCEDURE SUMMARY:  Successful US guided paracentesis from LLQ.  Yielded 3 L of clear yellow fluid.  No immediate complications.  Pt tolerated well.   Specimen was sent for labs.  EBL < 70mL  Brayton El PA-C 11/12/2020 3:25 PM

## 2020-11-12 NOTE — Consult Note (Signed)
Reason for Consult: Decompensated cirrhosis Referring Physician: [facility]  Olegario Shearer HPI: This is a 65 year old male with a PMH of ETOH abuse, untreated HCV, COPD, and depression admitted for decompensated cirrhosis.  The patient reports having increased abdominal swelling and LE edema for the past two months.  The swelling started in his abdomen and then it progressed to his lower extermities.  As a result of the swelling and feeling poorly overall, he presented to the ER.  He was noted to have a large amount of ascites and he was hyponatremic and thrombocytopenic.  The patient typically drinks 6-8 cans of 12 oz of beer on a daily basis.  Three liters of ascitic fluid were removed with the paracentesis and this allowed him to breath easier.  Past Medical History:  Diagnosis Date  . Alcoholism (HCC)   . COPD (chronic obstructive pulmonary disease) (HCC)   . Depression   . Hepatitis C     Past Surgical History:  Procedure Laterality Date  . IR PARACENTESIS  11/12/2020    History reviewed. No pertinent family history.  Social History:  reports that he has been smoking cigarettes. He has been smoking about 2.00 packs per day. He has never used smokeless tobacco. He reports current alcohol use of about 50.0 standard drinks of alcohol per week. He reports that he does not use drugs.  Allergies:  Allergies  Allergen Reactions  . Sertraline Rash    Medications:  Scheduled: . fluticasone furoate-vilanterol  1 puff Inhalation Daily  . folic acid  1 mg Oral Daily  . furosemide  20 mg Intravenous Daily  . ipratropium  0.5 mg Nebulization Q6H  . lidocaine      . multivitamin with minerals  1 tablet Oral Daily  . nicotine  21 mg Transdermal Daily  . [START ON 11/13/2020] spironolactone  100 mg Oral Daily  . thiamine  100 mg Oral Daily   Or  . thiamine  100 mg Intravenous Daily   Continuous: . albumin human 25 g (11/12/20 1547)    Results for orders placed or performed  during the hospital encounter of 11/12/20 (from the past 24 hour(s))  CBC     Status: Abnormal   Collection Time: 11/12/20  9:57 AM  Result Value Ref Range   WBC 6.9 4.0 - 10.5 K/uL   RBC 3.73 (L) 4.22 - 5.81 MIL/uL   Hemoglobin 12.3 (L) 13.0 - 17.0 g/dL   HCT 42.7 (L) 06.2 - 37.6 %   MCV 93.0 80.0 - 100.0 fL   MCH 33.0 26.0 - 34.0 pg   MCHC 35.4 30.0 - 36.0 g/dL   RDW 28.3 15.1 - 76.1 %   Platelets 72 (L) 150 - 400 K/uL   nRBC 0.0 0.0 - 0.2 %  Comprehensive metabolic panel     Status: Abnormal   Collection Time: 11/12/20  9:57 AM  Result Value Ref Range   Sodium 122 (L) 135 - 145 mmol/L   Potassium 3.4 (L) 3.5 - 5.1 mmol/L   Chloride 91 (L) 98 - 111 mmol/L   CO2 20 (L) 22 - 32 mmol/L   Glucose, Bld 90 70 - 99 mg/dL   BUN <5 (L) 8 - 23 mg/dL   Creatinine, Ser 6.07 0.61 - 1.24 mg/dL   Calcium 7.6 (L) 8.9 - 10.3 mg/dL   Total Protein 9.2 (H) 6.5 - 8.1 g/dL   Albumin 2.1 (L) 3.5 - 5.0 g/dL   AST 94 (H) 15 - 41  U/L   ALT 30 0 - 44 U/L   Alkaline Phosphatase 128 (H) 38 - 126 U/L   Total Bilirubin 1.9 (H) 0.3 - 1.2 mg/dL   GFR, Estimated >16>60 >10>60 mL/min   Anion gap 11 5 - 15  Protime-INR     Status: Abnormal   Collection Time: 11/12/20 10:09 AM  Result Value Ref Range   Prothrombin Time 19.1 (H) 11.4 - 15.2 seconds   INR 1.7 (H) 0.8 - 1.2  Ethanol     Status: Abnormal   Collection Time: 11/12/20 10:09 AM  Result Value Ref Range   Alcohol, Ethyl (B) 70 (H) <10 mg/dL  Acetaminophen level     Status: Abnormal   Collection Time: 11/12/20 10:09 AM  Result Value Ref Range   Acetaminophen (Tylenol), Serum <10 (L) 10 - 30 ug/mL  Resp Panel by RT-PCR (Flu A&B, Covid) Nasopharyngeal Swab     Status: None   Collection Time: 11/12/20 10:14 AM   Specimen: Nasopharyngeal Swab; Nasopharyngeal(NP) swabs in vial transport medium  Result Value Ref Range   SARS Coronavirus 2 by RT PCR NEGATIVE NEGATIVE   Influenza A by PCR NEGATIVE NEGATIVE   Influenza B by PCR NEGATIVE NEGATIVE  Magnesium      Status: None   Collection Time: 11/12/20  1:21 PM  Result Value Ref Range   Magnesium 1.7 1.7 - 2.4 mg/dL  Phosphorus     Status: None   Collection Time: 11/12/20  1:21 PM  Result Value Ref Range   Phosphorus 2.7 2.5 - 4.6 mg/dL  TSH     Status: None   Collection Time: 11/12/20  1:26 PM  Result Value Ref Range   TSH 1.770 0.350 - 4.500 uIU/mL  Ammonia     Status: Abnormal   Collection Time: 11/12/20  1:26 PM  Result Value Ref Range   Ammonia 96 (H) 9 - 35 umol/L  Lactate dehydrogenase (pleural or peritoneal fluid)     Status: Abnormal   Collection Time: 11/12/20  3:15 PM  Result Value Ref Range   LD, Fluid 44 (H) 3 - 23 U/L   Fluid Type-FLDH ABDOMEN   Albumin, pleural or peritoneal fluid     Status: None   Collection Time: 11/12/20  3:15 PM  Result Value Ref Range   Albumin, Fluid <1.0 g/dL   Fluid Type-FALB ABDOMEN   Protein, pleural or peritoneal fluid     Status: None   Collection Time: 11/12/20  3:15 PM  Result Value Ref Range   Total protein, fluid <3.0 g/dL   Fluid Type-FTP ABDOMEN      CT ABDOMEN PELVIS W CONTRAST  Result Date: 11/12/2020 CLINICAL DATA:  Ascites, assess for esophageal varices EXAM: CT ABDOMEN AND PELVIS WITH CONTRAST TECHNIQUE: Multidetector CT imaging of the abdomen and pelvis was performed using the standard protocol following bolus administration of intravenous contrast. CONTRAST:  100mL OMNIPAQUE IOHEXOL 300 MG/ML  SOLN COMPARISON:  10/15/2012 FINDINGS: Lower chest: Lung bases are clear. Hepatobiliary: Heterogeneous enhancement with suspected micronodular cirrhosis. No dominant lesion. Gallbladder is unremarkable. No intrahepatic or extrahepatic ductal dilatation. Pancreas: Within normal limits. Spleen: Normal in size. Adrenals/Urinary Tract: Adrenal glands are within normal limits. Kidneys are within normal limits.  No hydronephrosis. Bladder is within normal limits. Stomach/Bowel: Stomach is within normal limits. No evidence of bowel obstruction.  Appendix is not discretely visualized. Wall thickening involving the cecum/ascending colon, favoring passive venous congestion. Vascular/Lymphatic: No evidence of abdominal aortic aneurysm. Atherosclerotic calcifications of the abdominal aorta and  branch vessels. Portal vein is patent.  No dominant gastroesophageal varices. Reproductive: Prostate is notable for dystrophic calcifications and a 16 mm left anterior cyst. Other: Moderate abdominopelvic ascites. Musculoskeletal: Mild degenerative changes at L5-S1. IMPRESSION: Suspected micronodular cirrhosis.  No dominant hepatic lesion. Moderate abdominopelvic ascites. Portal vein is patent. No dominant gastroesophageal varices. Spleen is normal in size. Wall thickening involving the cecum/ascending colon, favoring passive venous congestion. Electronically Signed   By: Charline Bills M.D.   On: 11/12/2020 14:56   DG Abdomen Acute W/Chest  Result Date: 11/12/2020 CLINICAL DATA:  Ascites, tachycardia EXAM: DG ABDOMEN ACUTE WITH 1 VIEW CHEST COMPARISON:  None. FINDINGS: Cardiac and mediastinal contours normal. Atherosclerotic calcification aortic arch. Pulmonary vascularity normal. Mild linear densities in the lung bases bilaterally most likely atelectasis. Normal bowel gas pattern. No obstruction or ileus. No free air. Negative for urinary tract calculi. Diffuse ground-glass density the abdomen may represent ascites. No acute skeletal abnormality. IMPRESSION: Mild bibasilar atelectasis or scarring Normal bowel gas pattern.  Possible ascites. Electronically Signed   By: Marlan Palau M.D.   On: 11/12/2020 10:43   IR Paracentesis  Result Date: 11/12/2020 INDICATION: Abdominal distention, suspected cirrhosis. Ascites. Request for diagnostic and therapeutic paracentesis. EXAM: ULTRASOUND GUIDED LEFT LOWER QUADRANT PARACENTESIS MEDICATIONS: None. COMPLICATIONS: None immediate. PROCEDURE: Informed written consent was obtained from the patient after a discussion of the  risks, benefits and alternatives to treatment. A timeout was performed prior to the initiation of the procedure. Initial ultrasound scanning demonstrates a large amount of ascites within the left lower abdominal quadrant. The left lower abdomen was prepped and draped in the usual sterile fashion. 1% lidocaine was used for local anesthesia. Following this, a 19 gauge, 7-cm, Yueh catheter was introduced. An ultrasound image was saved for documentation purposes. The paracentesis was performed. The catheter was removed and a dressing was applied. The patient tolerated the procedure well without immediate post procedural complication. FINDINGS: A total of approximately 3 L of clear yellow fluid was removed. Samples were sent to the laboratory as requested by the clinical team. IMPRESSION: Successful ultrasound-guided paracentesis yielding 3 liters of peritoneal fluid. Read by: Brayton El PA-C Electronically Signed   By: Simonne Come M.D.   On: 11/12/2020 15:22    ROS:  As stated above in the HPI otherwise negative.  Blood pressure 126/87, pulse (!) 114, temperature 98.4 F (36.9 C), temperature source Oral, resp. rate 20, height 5\' 8"  (1.727 m), weight 77.1 kg, SpO2 100 %.    PE: Gen: NAD, Alert and Oriented HEENT:  [place]/AT, EOMI, temporal wasting Neck: Supple, no LAD Lungs: CTA Bilaterally CV: RRR without M/G/R ABD: Soft, distended with ascites, nontender, +BS Ext: 2+ edema, palmar erythema  Assessment/Plan: 1) Decompensated cirrhosis. 2) Ascites. 3) ETOH abuse. 4) HCV. 5) Thrombocytopenia. 6) Hyponatremia.   The patient admits the errors of his ways and he is willing to make a change.  He was counseled about stopping ETOH completely and there can be no half-measures.  His HCV does require treatment and it appears that he was offered treatment 5 years ago.  The patient states that he was "stubborn headed" and he refused treatment.  This issue can be managed through the system as this will  improve his overall clinical status.  Plan: 1) Step 1 diuretics:  Furosemide 40 mg QD and spironolactone 100 mg QD. 2) 2 gram (2000 mg) sodium diet.  This was stressed as a high salt intake will negate the diuretic effects. 3) Treatment  his HCV through the [place]. 4) No further ETOH. 5) There is no need for fluid restriction.  Again, it is the salt intake that is critical. 6) ETOH counseling. 7) Follow up at the [facility] with an EGD to screen for varices. 8) He will benefit from another paracentesis to help with the distension.  Devyn Sheerin D 11/12/2020, 5:37 PM

## 2020-11-12 NOTE — H&P (Signed)
History and Physical    Michael Patton:[id number] DOB: Feb 28, 1956 DOA: 11/12/2020  PCP: Patient, No Pcp Per (Confirm with patient/family/NH records and if not entered, this has to be entered at [facility] point of entry) Patient coming from: Home  I have personally briefly reviewed patient's old medical records in [facility]  Chief Complaint: I am all swelling up  HPI: Michael Patton is a 65 y.o. male with medical history significant of chronic hepatitis C untreated, COPD cigarette smoker, chronic alcohol abuse, presented with increased swelling of legs and abdomen for 2+ months.  Patient was diagnosed with hepatitis C about 5 years ago at the [place], but has never been treated.  Drinks 6-8 cans of 12 oz beer every day and last drink was last night. But he denied any Hx of EtOH withdrawal. He started to notice his ankles were started to swell about 2-3 months ago and gradually getting worse and slowly swelling went up and now his abdomen were swelling too "feels so stretchy". He denies any nausea vomiting, diarrhea, no dark-colored stool.  But he did not notice easy bruising.  He has been feeling very weak and heavy with distended belly and [facility] instead.  ED Course: Large amount of ascites on physical exam, blood work sodium 122, alcohol 70, platelets 72.  Albumin 2.9, hemoglobin 12.3.  Review of Systems: As per HPI otherwise 14 point review of systems negative.    Past Medical History:  Diagnosis Date  . Alcoholism (HCC)   . COPD (chronic obstructive pulmonary disease) (HCC)   . Depression   . Hepatitis C     History reviewed. No pertinent surgical history.   reports that he has been smoking cigarettes. He has been smoking about 2.00 packs per day. He has never used smokeless tobacco. He reports current alcohol use of about 50.0 standard drinks of alcohol per week. He reports that he does not use drugs.  No Known Allergies  History reviewed.  No pertinent family history.   Prior to Admission medications   Not on File    Physical Exam: Vitals:   11/12/20 1230 11/12/20 1245 11/12/20 1300 11/12/20 1315  BP: 126/89 (!) 130/91 133/87 134/89  Pulse: (!) 105 (!) 105 (!) 102 (!) 109  Resp: 20 20 (!) 21 20  Temp:      TempSrc:      SpO2: 98% 98% 96% 97%  Weight:      Height:        Constitutional: NAD, calm, comfortable Vitals:   11/12/20 1230 11/12/20 1245 11/12/20 1300 11/12/20 1315  BP: 126/89 (!) 130/91 133/87 134/89  Pulse: (!) 105 (!) 105 (!) 102 (!) 109  Resp: 20 20 (!) 21 20  Temp:      TempSrc:      SpO2: 98% 98% 96% 97%  Weight:      Height:       Eyes: PERRL, lids and conjunctivae normal ENMT: Mucous membranes are moist. Posterior pharynx clear of any exudate or lesions.Normal dentition.  Neck: normal, supple, no masses, no thyromegaly Respiratory: clear to auscultation bilaterally, scattered wheezing, no crackles. Normal respiratory effort. No accessory muscle use.  Cardiovascular: Regular rate and rhythm, no murmurs / rubs / gallops. 2+ extremity edema. 2+ pedal pulses. No carotid bruits.  Abdomen: no tenderness, distended, positive ascites sign, no masses palpated. No hepatosplenomegaly. Bowel sounds positive.  Musculoskeletal: no clubbing / cyanosis. No joint deformity upper and lower extremities.  Good ROM, no contractures. Normal muscle tone.  Skin: no rashes, lesions, ulcers. No induration Neurologic: CN 2-12 grossly intact. Sensation intact, DTR normal. Strength 5/5 in all 4.  Psychiatric: Normal judgment and insight. Alert and oriented x 3. Normal mood.    Labs on Admission: I have personally reviewed following labs and imaging studies  CBC: Recent Labs  Lab 11/12/20 0957  WBC 6.9  HGB 12.3*  HCT 34.7*  MCV 93.0  PLT 72*   Basic Metabolic Panel: Recent Labs  Lab 11/12/20 0957  NA 122*  K 3.4*  CL 91*  CO2 20*  GLUCOSE 90  BUN <5*  CREATININE 0.63  CALCIUM 7.6*    GFR: Estimated Creatinine Clearance: 90.3 mL/min (by C-G formula based on SCr of 0.63 mg/dL). Liver Function Tests: Recent Labs  Lab 11/12/20 0957  AST 94*  ALT 30  ALKPHOS 128*  BILITOT 1.9*  PROT 9.2*  ALBUMIN 2.1*   No results for input(s): LIPASE, AMYLASE in the last 168 hours. No results for input(s): AMMONIA in the last 168 hours. Coagulation Profile: Recent Labs  Lab 11/12/20 1009  INR 1.7*   Cardiac Enzymes: No results for input(s): CKTOTAL, CKMB, CKMBINDEX, TROPONINI in the last 168 hours. BNP (last 3 results) No results for input(s): PROBNP in the last 8760 hours. HbA1C: No results for input(s): HGBA1C in the last 72 hours. CBG: No results for input(s): GLUCAP in the last 168 hours. Lipid Profile: No results for input(s): CHOL, HDL, LDLCALC, TRIG, CHOLHDL, LDLDIRECT in the last 72 hours. Thyroid Function Tests: No results for input(s): TSH, T4TOTAL, FREET4, T3FREE, THYROIDAB in the last 72 hours. Anemia Panel: No results for input(s): VITAMINB12, FOLATE, FERRITIN, TIBC, IRON, RETICCTPCT in the last 72 hours. Urine analysis:    Component Value Date/Time   COLORURINE YELLOW 10/15/2012 1610   APPEARANCEUR CLEAR 10/15/2012 1610   LABSPEC 1.034 (H) 10/15/2012 1610   PHURINE 5.5 10/15/2012 1610   GLUCOSEU 100 (A) 10/15/2012 1610   HGBUR NEGATIVE 10/15/2012 1610   BILIRUBINUR NEGATIVE 10/15/2012 1610   KETONESUR NEGATIVE 10/15/2012 1610   PROTEINUR NEGATIVE 10/15/2012 1610   UROBILINOGEN 1.0 10/15/2012 1610   NITRITE NEGATIVE 10/15/2012 1610   LEUKOCYTESUR NEGATIVE 10/15/2012 1610    Radiological Exams on Admission: DG Abdomen Acute W/Chest  Result Date: 11/12/2020 CLINICAL DATA:  Ascites, tachycardia EXAM: DG ABDOMEN ACUTE WITH 1 VIEW CHEST COMPARISON:  None. FINDINGS: Cardiac and mediastinal contours normal. Atherosclerotic calcification aortic arch. Pulmonary vascularity normal. Mild linear densities in the lung bases bilaterally most likely  atelectasis. Normal bowel gas pattern. No obstruction or ileus. No free air. Negative for urinary tract calculi. Diffuse ground-glass density the abdomen may represent ascites. No acute skeletal abnormality. IMPRESSION: Mild bibasilar atelectasis or scarring Normal bowel gas pattern.  Possible ascites. Electronically Signed   By: Marlan Palau M.D.   On: 11/12/2020 10:43    EKG: Independently reviewed. Sinus tachy  Assessment/Plan Active Problems:   Hyponatremia  (please populate well all problems here in Problem List. (For example, if patient is on BP meds at home and you resume or decide to hold them, it is a problem that needs to be her. Same for CAD, COPD, HLD and so on)  Hyponatremia -Likely acute on chronic -Secondary to alcohol abuse and cirrhosis -Fluid restriction, start Lasix tomorrow after paracentesis. -TSH -Recheck Na level tonight.  Generalized weakness -Probably related to hyponatremia and worsening of cirrhosis  Decompensated liver failure -Evidenced by decreased synthetic function of coagulopathy/elevated INR, hypoalbuminemia, and  worsening of ascites. -Vitamin K x1 and daily INR -Albumin infusion every 6 hours x2 days, additional doses after paracentesis. -Check ammonia level, start lactulose as needed -CT abd with contrast to look for evidence of esophageal varices, and then will consider beta-blocker. -Consult GI for close follow-up.  Ascites -Consult IR for paracentesis -Start Lasix and Aldactone tomorrow after paracentesis.  COPD with mild exacerbation -Patient has not been compliant with COPD medications, start DuoNeb and Breo.  Chronic hep C -We will discuss with GI regarding treatment outpatient  Cigarette smoker -Nicotine patch  Alcohol abuse -No symptoms signs of acute withdrawal -CIWA with as needed Benzo  DVT prophylaxis: INR=1.7 Code Status: Full Code Family Communication: None at bedside Disposition Plan: Expect 1-2 days hospital stay to  stabilize Na level Consults called: GI Dr. Elnoria Howard Admission status: Tele admit   Emeline General MD [facility] Pager [phone]  11/12/2020, 1:25 PM

## 2020-11-12 NOTE — ED Provider Notes (Signed)
[facility] EMERGENCY DEPARTMENT Provider Note   CSN: [id number] Arrival date & time: 11/12/20  0947     History Chief Complaint  Patient presents with  . Fluid Overload    Michael Patton is a 65 y.o. male.  HPI Patient presents with some swelling in his abdomen and legs.  States has history of hepatitis C and is a heavy drinker.  States drinks 6-8 beers a day.  Really does not go without drinking.  States over the last month or 2 his abdomen is swollen more.  Has been seen at the [facility] for his medical care but states has not been told he has cirrhosis.  Nursing notes states that he has had a paracentesis although patient denies previous drainage of the fluid or collection of the fluid.  States mild swelling the legs.  No blood in stool or black stools.  No fevers or chills.  States he has some mild shortness of breath.  Does have a history of COPD.    Past Medical History:  Diagnosis Date  . Alcoholism (HCC)   . COPD (chronic obstructive pulmonary disease) (HCC)   . Depression   . Hepatitis C     Patient Active Problem List   Diagnosis Date Noted  . Uncontrolled pain 10/15/2012  . Tobacco abuse 10/15/2012  . Constipation 10/15/2012  . Alcohol abuse 10/15/2012  . CAP (community acquired pneumonia) 10/15/2012    History reviewed. No pertinent surgical history.     History reviewed. No pertinent family history.  Social History   Tobacco Use  . Smoking status: Current Every Day Smoker    Packs/day: 2.00    Types: Cigarettes  . Smokeless tobacco: Never Used  Substance Use Topics  . Alcohol use: Yes    Alcohol/week: 50.0 standard drinks    Types: 50 Cans of beer per week    Comment: Wkly   . Drug use: No    Types: "Crack" cocaine, Opium, Marijuana    Comment: Daily     Home Medications Prior to Admission medications   Not on File    Allergies    Patient has no known allergies.  Review of Systems   Review of Systems  Constitutional:  Positive for appetite change and fatigue. Negative for fever.  HENT: Negative for congestion.   Respiratory: Positive for shortness of breath.   Gastrointestinal: Positive for abdominal distention. Negative for diarrhea, nausea and vomiting.  Genitourinary: Negative for flank pain.  Musculoskeletal: Negative for back pain.  Neurological: Negative for weakness.  Psychiatric/Behavioral: Negative for confusion.    Physical Exam Updated Vital Signs BP 126/89   Pulse (!) 105   Temp 97.9 F (36.6 C) (Oral)   Resp 20   Ht 5\' 8"  (1.727 m)   Wt 77.1 kg   SpO2 98%   BMI 25.85 kg/m   Physical Exam Vitals and nursing note reviewed.  Constitutional:      Appearance: Normal appearance.  HENT:     Head: Normocephalic.     Mouth/Throat:     Mouth: Mucous membranes are moist.  Eyes:     General: No scleral icterus.    Pupils: Pupils are equal, round, and reactive to light.  Cardiovascular:     Rate and Rhythm: Regular rhythm. Tachycardia present.  Pulmonary:     Breath sounds: No wheezing, rhonchi or rales.  Abdominal:     General: There is distension.     Tenderness: There is no abdominal tenderness.  Musculoskeletal:  General: No tenderness.     Cervical back: Neck supple.     Comments: Mild edema bilateral lower extremities.  Skin:    General: Skin is warm.     Capillary Refill: Capillary refill takes less than 2 seconds.     Coloration: Skin is not jaundiced.  Neurological:     Mental Status: He is alert and oriented to person, place, and time.  Psychiatric:        Mood and Affect: Mood normal.     ED Results / Procedures / Treatments   Labs (all labs ordered are listed, but only abnormal results are displayed) Labs Reviewed  CBC - Abnormal; Notable for the following components:      Result Value   RBC 3.73 (*)    Hemoglobin 12.3 (*)    HCT 34.7 (*)    Platelets 72 (*)    All other components within normal limits  COMPREHENSIVE METABOLIC PANEL - Abnormal;  Notable for the following components:   Sodium 122 (*)    Potassium 3.4 (*)    Chloride 91 (*)    CO2 20 (*)    BUN <5 (*)    Calcium 7.6 (*)    Total Protein 9.2 (*)    Albumin 2.1 (*)    AST 94 (*)    Alkaline Phosphatase 128 (*)    Total Bilirubin 1.9 (*)    All other components within normal limits  PROTIME-INR - Abnormal; Notable for the following components:   Prothrombin Time 19.1 (*)    INR 1.7 (*)    All other components within normal limits  ETHANOL - Abnormal; Notable for the following components:   Alcohol, Ethyl (B) 70 (*)    All other components within normal limits  ACETAMINOPHEN LEVEL - Abnormal; Notable for the following components:   Acetaminophen (Tylenol), Serum <10 (*)    All other components within normal limits  RESP PANEL BY RT-PCR (FLU A&B, COVID) [id number]    EKG EKG Interpretation  Date/Time:  Tuesday November 12 2020 09:53:14 EST Ventricular Rate:  132 PR Interval:  138 QRS Duration: 90 QT Interval:  304 QTC Calculation: 450 R Axis:     Text Interpretation: Sinus tachycardia with Premature atrial complexes Inferior infarct , age undetermined Cannot rule out Anterior infarct , age undetermined Abnormal ECG Confirmed by Benjiman Core [phone]) on 11/12/2020 10:49:31 AM   Radiology DG Abdomen Acute W/Chest  Result Date: 11/12/2020 CLINICAL DATA:  Ascites, tachycardia EXAM: DG ABDOMEN ACUTE WITH 1 VIEW CHEST COMPARISON:  None. FINDINGS: Cardiac and mediastinal contours normal. Atherosclerotic calcification aortic arch. Pulmonary vascularity normal. Mild linear densities in the lung bases bilaterally most likely atelectasis. Normal bowel gas pattern. No obstruction or ileus. No free air. Negative for urinary tract calculi. Diffuse ground-glass density the abdomen may represent ascites. No acute skeletal abnormality. IMPRESSION: Mild bibasilar atelectasis or scarring Normal bowel gas pattern.  Possible ascites. Electronically Signed   By: Marlan Palau M.D.    On: 11/12/2020 10:43    Procedures Procedures   Medications Ordered in ED Medications - No data to display  ED Course  I have reviewed the triage vital signs and the nursing notes.  Pertinent labs & imaging results that were available during my care of the patient were reviewed by me and considered in my medical decision making (see chart for details).    MDM Rules/Calculators/A&P  Patient with abdominal pain and swelling.  Reported history of hepatitis C but also was a heavy drinker.  Over the last couple months increased swelling.  Increased dyspnea with it.  Has hyponatremia elevated bilirubin and mildly elevated INR.  Meld score of 24.  Has negative Covid testing.  I think patient is at risk for acute alcohol withdrawal also.  Drinks at least 6-8 beers a day.  Likely will also require paracentesis and further imaging.  Initially had tachycardia with the 130s with exertion.  I feels the patient would benefit from inpatient admission for this as opposed to more of an outpatient work-up. Final Clinical Impression(s) / ED Diagnoses Final diagnoses:  Ascites due to alcoholic cirrhosis (HCC)  Hyponatremia    Rx / DC Orders ED Discharge Orders    None       Benjiman Core, MD 11/12/20 1250

## 2020-11-13 DIAGNOSIS — E871 Hypo-osmolality and hyponatremia: Secondary | ICD-10-CM | POA: Diagnosis not present

## 2020-11-13 DIAGNOSIS — K7031 Alcoholic cirrhosis of liver with ascites: Secondary | ICD-10-CM

## 2020-11-13 LAB — COMPREHENSIVE METABOLIC PANEL
ALT: 21 U/L (ref 0–44)
AST: 68 U/L — ABNORMAL HIGH (ref 15–41)
Albumin: 2 g/dL — ABNORMAL LOW (ref 3.5–5.0)
Alkaline Phosphatase: 76 U/L (ref 38–126)
Anion gap: 7 (ref 5–15)
BUN: <5 mg/dL — ABNORMAL LOW (ref 8–23)
CO2: 22 mmol/L (ref 22–32)
Calcium: 7.4 mg/dL — ABNORMAL LOW (ref 8.9–10.3)
Chloride: 95 mmol/L — ABNORMAL LOW (ref 98–111)
Creatinine, Ser: 0.51 mg/dL — ABNORMAL LOW (ref 0.61–1.24)
GFR, Estimated: >60 mL/min (ref >60)
Glucose, Bld: 78 mg/dL (ref 70–99)
Potassium: 3.3 mmol/L — ABNORMAL LOW (ref 3.5–5.1)
Sodium: 124 mmol/L — ABNORMAL LOW (ref 135–145)
Total Bilirubin: 2.8 mg/dL — ABNORMAL HIGH (ref 0.3–1.2)
Total Protein: 7.9 g/dL (ref 6.5–8.1)

## 2020-11-13 MED ORDER — POTASSIUM CHLORIDE CRYS ER 20 MEQ PO TBCR
40.0000 meq | EXTENDED_RELEASE_TABLET | Freq: Once | ORAL | Status: AC
  Administered 2020-11-13: 40 meq via ORAL
  Filled 2020-11-13: qty 2

## 2020-11-13 MED ORDER — IPRATROPIUM-ALBUTEROL 0.5-2.5 (3) MG/3ML IN SOLN: 3.0000 mL | Freq: Four times a day (QID) | RESPIRATORY_TRACT | Status: DC | PRN

## 2020-11-13 NOTE — Progress Notes (Signed)
PROGRESS NOTE                                                                             PROGRESS NOTE                                                                                                                                                                                                             Patient Demographics:    Michael Patton, is a 65 y.o. male, DOB - Aug 15, 1956, MVH:[id number]  Outpatient Primary MD for the patient is Patient, No Pcp Per    LOS - 1  Admit date - 11/12/2020    Chief Complaint  Patient presents with  . Fluid Overload       Brief Narrative    Michael Patton is a 65 y.o. male with medical history significant of chronic hepatitis C untreated, COPD cigarette smoker, chronic alcohol abuse, presented with increased swelling of legs and abdomen for 2+ months.  Patient was diagnosed with hepatitis C about 5 years ago at the [place], but has never been treated.  Drinks 6-8 cans of 12 oz beer every day and last drink was last night. But he denied any Hx of EtOH withdrawal. He started to notice his ankles were started to swell about 2-3 months ago and gradually getting worse and slowly swelling went up and now his abdomen were swelling too "feels so stretchy". He denies any nausea vomiting, diarrhea, no dark-colored stool.  But he did not notice easy bruising.  He has been feeling very weak and heavy with distended belly and [facility], so [facility] instead.  ED Course: Large amount of ascites on physical exam, blood work sodium 122, alcohol 70, platelets 72.  Albumin 2.9, hemoglobin 12.3.   Subjective:    Michael Patton today denies any chest pain, abdominal pain, fever or chills, reports shortness of breath has significantly improved after paracentesis.     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  Generalized weakness -Probably related to hyponatremia and worsening of cirrhosis - PT consulted  Decompensated liver cirrhosis with ascites, in the setting of alcohol abuse and HSV. -With evidence of coagulopathy, received IV vitamin K. -With ascites, status post paracentesis, and IV albumin. -Continue with 2 g sodium diet. -On diuresis, continue with Lasix 40 mg daily and Aldactone.  -[facility].  Ascites -Status post 3 L paracentesis  COPD with mild exacerbation -Patient has not been compliant with COPD medications, start DuoNeb and Breo.  Chronic hep C - Discussed with patient regarding HCV treatment, report he was offered it in the past through his [place] but he declined, report he will follow-up as an outpatient he received treatment.  Cigarette smoker -Nicotine patch  Alcohol abuse -No symptoms signs of acute withdrawal -CIWA with as needed Benzo  SpO2: 95 %  Recent Labs  Lab 11/12/20 0957 11/12/20 1009 11/12/20 1014 11/13/20 0129  WBC 6.9  --   --   --   PLT 72*  --   --   --   AST 94*  --   --  68*  ALT 30  --   --  21  ALKPHOS 128*  --   --  76  BILITOT 1.9*  --   --  2.8*  ALBUMIN 2.1*  --   --  2.0*  INR  --  1.7*  --   --   SARSCOV2NAA  --   --  NEGATIVE  --        ABG     Component Value Date/Time   TCO2 24 08/03/2017 1154         Condition - Extremely Guarded  Family Communication  :  None at bedsdie  Code Status :  Full  Consults  :  GI  Disposition Plan  :    Status is: Inpatient  Remains inpatient appropriate because:IV treatments appropriate due to intensity of illness or inability to take PO   Dispo: The patient is from: Home              Anticipated d/c is to: Home              Patient currently is not medically stable to d/c.   Difficult to place patient No      DVT Prophylaxis  :   SCDs   Lab Results  Component Value Date   PLT 72 (L)  11/12/2020    Diet :  Diet Order            Diet 2 gram sodium Room service appropriate? Yes; Fluid consistency: Thin  Diet effective now                  Inpatient Medications  Scheduled Meds: . fluticasone furoate-vilanterol  1 puff Inhalation Daily  . folic acid  1 mg Oral Daily  . furosemide  20 mg Intravenous Daily  . multivitamin with minerals  1 tablet Oral Daily  . nicotine  21 mg Transdermal Daily  . spironolactone  100 mg Oral Daily  . thiamine  100 mg Oral Daily   Or  . thiamine  100 mg Intravenous Daily   Continuous Infusions: PRN Meds:.ipratropium-albuterol, lactulose, LORazepam **OR** LORazepam  Antibiotics  :    Anti-infectives (From admission, onward)   None  Huey Bienenstockawood Aundra Espin M.D on 11/13/2020 at 2:24 PM  To page go to [email or web address]   [facility] -  Office  [phone]      Objective:   Vitals:   11/13/20 0500 11/13/20 0747 11/13/20 0800 11/13/20 1150  BP:  114/78  107/69  Pulse:  94 94 90  Resp:  20  18  Temp:  97.6 F (36.4 C)  98.7 F (37.1 C)  TempSrc:  Axillary  Axillary  SpO2:  94% 94% 95%  Weight: 78 kg     Height:        Wt Readings from Last 3 Encounters:  11/13/20 78 kg  07/25/17 72.6 kg  10/15/12 64.4 kg    No intake or output data in the 24 hours ending 11/13/20 1424   Physical Exam  Awake Alert, No new F.N deficits, Normal affect Symmetrical Chest wall movement, Good air movement bilaterally, CTAB RRR,No Gallops,Rubs or new Murmurs, No Parasternal Heave +ve B.Sounds, minimal ascites, no tenderness,  No rebound - guarding or rigidity. No Cyanosis, Clubbing or edema, No new Rash or bruise      Data Review:    CBC Recent Labs  Lab 11/12/20 0957  WBC 6.9  HGB 12.3*  HCT 34.7*  PLT 72*  MCV 93.0  MCH 33.0  MCHC 35.4  RDW 13.7    Recent Labs  Lab 11/12/20 0957 11/12/20 1009 11/12/20 1321 11/12/20 1326 11/12/20 2032 11/13/20 0129  NA 122*  --   --   --  124* 124*  K 3.4*  --    --   --  3.6 3.3*  CL 91*  --   --   --  94* 95*  CO2 20*  --   --   --  22 22  GLUCOSE 90  --   --   --  93 78  BUN <5*  --   --   --  <5* <5*  CREATININE 0.63  --   --   --  0.57* 0.51*  CALCIUM 7.6*  --   --   --  7.7* 7.4*  AST 94*  --   --   --   --  68*  ALT 30  --   --   --   --  21  ALKPHOS 128*  --   --   --   --  76  BILITOT 1.9*  --   --   --   --  2.8*  ALBUMIN 2.1*  --   --   --   --  2.0*  MG  --   --  1.7  --   --   --   INR  --  1.7*  --   --   --   --   TSH  --   --   --  1.770  --   --   AMMONIA  --   --   --  96*  --   --     ------------------------------------------------------------------------------------------------------------------ No results for input(s): CHOL, HDL, LDLCALC, TRIG, CHOLHDL, LDLDIRECT in the last 72 hours.  No results found for: HGBA1C ------------------------------------------------------------------------------------------------------------------ Recent Labs    11/12/20 1326  TSH 1.770    Cardiac Enzymes No results for input(s): CKMB, TROPONINI, MYOGLOBIN in the last 168 hours.  Invalid input(s): CK ------------------------------------------------------------------------------------------------------------------ No results found for: BNP  Micro Results Recent Results (from the past 240 hour(s))  Resp Panel by RT-PCR (Flu A&B, Covid) Nasopharyngeal Swab     Status: None  Collection Time: 11/12/20 10:14 AM   Specimen: Nasopharyngeal Swab; Nasopharyngeal(NP) swabs in vial transport medium  Result Value Ref Range Status   SARS Coronavirus 2 by RT PCR NEGATIVE NEGATIVE Final    Comment: (NOTE) SARS-CoV-2 target nucleic acids are NOT DETECTED.  The SARS-CoV-2 RNA is generally detectable in upper respiratory specimens during the acute phase of infection. The lowest concentration of SARS-CoV-2 viral copies this assay can detect is 138 copies/mL. A negative result does not preclude SARS-Cov-2 infection and should not be used as  the sole basis for treatment or other patient management decisions. A negative result may occur with  improper specimen collection/handling, submission of specimen other than nasopharyngeal swab, presence of viral mutation(s) within the areas targeted by this assay, and inadequate number of viral copies(<138 copies/mL). A negative result must be combined with clinical observations, patient history, and epidemiological information. The expected result is Negative.  Fact Sheet for Patients:  BloggerCourse.com  Fact Sheet for Healthcare Providers:  SeriousBroker.it  This test is no t yet approved or cleared by the [place] FDA and  has been authorized for detection and/or diagnosis of SARS-CoV-2 by FDA under an Emergency Use Authorization (EUA). This EUA will remain  in effect (meaning this test can be used) for the duration of the COVID-19 declaration under Section 564(b)(1) of the Act, 21 U.S.C.section 360bbb-3(b)(1), unless the authorization is terminated  or revoked sooner.       Influenza A by PCR NEGATIVE NEGATIVE Final   Influenza B by PCR NEGATIVE NEGATIVE Final    Comment: (NOTE) The Xpert Xpress SARS-CoV-2/FLU/RSV plus assay is intended as an aid in the diagnosis of influenza from Nasopharyngeal swab specimens and should not be used as a sole basis for treatment. Nasal washings and aspirates are unacceptable for Xpert Xpress SARS-CoV-2/FLU/RSV testing.  Fact Sheet for Patients: BloggerCourse.com  Fact Sheet for Healthcare Providers: SeriousBroker.it  This test is not yet approved or cleared by the [place] FDA and has been authorized for detection and/or diagnosis of SARS-CoV-2 by FDA under an Emergency Use Authorization (EUA). This EUA will remain in effect (meaning this test can be used) for the duration of the COVID-19 declaration under Section 564(b)(1) of  the Act, 21 U.S.C. section 360bbb-3(b)(1), unless the authorization is terminated or revoked.  Performed at [facility], [address], [place], [place] [address]     Radiology Reports CT ABDOMEN PELVIS W CONTRAST  Result Date: 11/12/2020 CLINICAL DATA:  Ascites, assess for esophageal varices EXAM: CT ABDOMEN AND PELVIS WITH CONTRAST TECHNIQUE: Multidetector CT imaging of the abdomen and pelvis was performed using the standard protocol following bolus administration of intravenous contrast. CONTRAST:  OMNIPAQUE IOHEXOL 300 MG/ML  SOLN COMPARISON:  10/15/2012 FINDINGS: Lower chest: Lung bases are clear. Hepatobiliary: Heterogeneous enhancement with suspected micronodular cirrhosis. No dominant lesion. Gallbladder is unremarkable. No intrahepatic or extrahepatic ductal dilatation. Pancreas: Within normal limits. Spleen: Normal in size. Adrenals/Urinary Tract: Adrenal glands are within normal limits. Kidneys are within normal limits.  No hydronephrosis. Bladder is within normal limits. Stomach/Bowel: Stomach is within normal limits. No evidence of bowel obstruction. Appendix is not discretely visualized. Wall thickening involving the cecum/ascending colon, favoring passive venous congestion. Vascular/Lymphatic: No evidence of abdominal aortic aneurysm. Atherosclerotic calcifications of the abdominal aorta and branch vessels. Portal vein is patent.  No dominant gastroesophageal varices. Reproductive: Prostate is notable for dystrophic calcifications and a 16 mm left anterior cyst. Other: Moderate abdominopelvic ascites. Musculoskeletal: Mild degenerative changes at L5-S1.  IMPRESSION: Suspected micronodular cirrhosis.  No dominant hepatic lesion. Moderate abdominopelvic ascites. Portal vein is patent. No dominant gastroesophageal varices. Spleen is normal in size. Wall thickening involving the cecum/ascending colon, favoring passive venous congestion. Electronically Signed   By: Charline Bills  M.D.   On: 11/12/2020 14:56   DG Abdomen Acute W/Chest  Result Date: 11/12/2020 CLINICAL DATA:  Ascites, tachycardia EXAM: DG ABDOMEN ACUTE WITH 1 VIEW CHEST COMPARISON:  None. FINDINGS: Cardiac and mediastinal contours normal. Atherosclerotic calcification aortic arch. Pulmonary vascularity normal. Mild linear densities in the lung bases bilaterally most likely atelectasis. Normal bowel gas pattern. No obstruction or ileus. No free air. Negative for urinary tract calculi. Diffuse ground-glass density the abdomen may represent ascites. No acute skeletal abnormality. IMPRESSION: Mild bibasilar atelectasis or scarring Normal bowel gas pattern.  Possible ascites. Electronically Signed   By: Marlan Palau M.D.   On: 11/12/2020 10:43   IR Paracentesis  Result Date: 11/12/2020 INDICATION: Abdominal distention, suspected cirrhosis. Ascites. Request for diagnostic and therapeutic paracentesis. EXAM: ULTRASOUND GUIDED LEFT LOWER QUADRANT PARACENTESIS MEDICATIONS: None. COMPLICATIONS: None immediate. PROCEDURE: Informed written consent was obtained from the patient after a discussion of the risks, benefits and alternatives to treatment. A timeout was performed prior to the initiation of the procedure. Initial ultrasound scanning demonstrates a large amount of ascites within the left lower abdominal quadrant. The left lower abdomen was prepped and draped in the usual sterile fashion. 1% lidocaine was used for local anesthesia. Following this, a 19 gauge, 7-cm, Yueh catheter was introduced. An ultrasound image was saved for documentation purposes. The paracentesis was performed. The catheter was removed and a dressing was applied. The patient tolerated the procedure well without immediate post procedural complication. FINDINGS: A total of approximately 3 L of clear yellow fluid was removed. Samples were sent to the laboratory as requested by the clinical team. IMPRESSION: Successful ultrasound-guided paracentesis  yielding 3 liters of peritoneal fluid. Read by: Brayton El PA-C Electronically Signed   By: Simonne Come M.D.   On: 11/12/2020 15:22

## 2020-11-13 NOTE — Progress Notes (Signed)
Will recheck vital signs within the hour taken, if MEWS stays yellow then will implement interventions. Pt in no distress. Charge RN Duwayne Heck made aware. Will continue to monitor.

## 2020-11-14 DIAGNOSIS — K7031 Alcoholic cirrhosis of liver with ascites: Secondary | ICD-10-CM | POA: Diagnosis not present

## 2020-11-14 DIAGNOSIS — B182 Chronic viral hepatitis C: Secondary | ICD-10-CM | POA: Diagnosis not present

## 2020-11-14 DIAGNOSIS — E7220 Disorder of urea cycle metabolism, unspecified: Secondary | ICD-10-CM | POA: Diagnosis not present

## 2020-11-14 DIAGNOSIS — E871 Hypo-osmolality and hyponatremia: Secondary | ICD-10-CM | POA: Diagnosis not present

## 2020-11-14 LAB — COMPREHENSIVE METABOLIC PANEL
ALT: 20 U/L (ref 0–44)
AST: 55 U/L — ABNORMAL HIGH (ref 15–41)
Albumin: 2.1 g/dL — ABNORMAL LOW (ref 3.5–5.0)
Alkaline Phosphatase: 69 U/L (ref 38–126)
Anion gap: 7 (ref 5–15)
BUN: 6 mg/dL — ABNORMAL LOW (ref 8–23)
CO2: 24 mmol/L (ref 22–32)
Calcium: 7.7 mg/dL — ABNORMAL LOW (ref 8.9–10.3)
Chloride: 96 mmol/L — ABNORMAL LOW (ref 98–111)
Creatinine, Ser: 0.6 mg/dL — ABNORMAL LOW (ref 0.61–1.24)
GFR, Estimated: >60 mL/min (ref >60)
Glucose, Bld: 98 mg/dL (ref 70–99)
Potassium: 3.5 mmol/L (ref 3.5–5.1)
Sodium: 127 mmol/L — ABNORMAL LOW (ref 135–145)
Total Bilirubin: 2.6 mg/dL — ABNORMAL HIGH (ref 0.3–1.2)
Total Protein: 7.8 g/dL (ref 6.5–8.1)

## 2020-11-14 LAB — CBC
HCT: 29.7 % — ABNORMAL LOW (ref 39.0–52.0)
Hemoglobin: 10.8 g/dL — ABNORMAL LOW (ref 13.0–17.0)
MCH: 33.4 pg (ref 26.0–34.0)
MCHC: 36.4 g/dL — ABNORMAL HIGH (ref 30.0–36.0)
MCV: 92 fL (ref 80.0–100.0)
Platelets: 72 10*3/uL — ABNORMAL LOW (ref 150–400)
RBC: 3.23 MIL/uL — ABNORMAL LOW (ref 4.22–5.81)
RDW: 13.7 % (ref 11.5–15.5)
WBC: 7.3 10*3/uL (ref 4.0–10.5)
nRBC: 0 % (ref 0.0–0.2)

## 2020-11-14 LAB — PROTIME-INR
INR: 2.1 — ABNORMAL HIGH (ref 0.8–1.2)
Prothrombin Time: 22.8 seconds — ABNORMAL HIGH (ref 11.4–15.2)

## 2020-11-14 LAB — AMMONIA: Ammonia: 50 umol/L — ABNORMAL HIGH (ref 9–35)

## 2020-11-14 MED ORDER — FUROSEMIDE 40 MG PO TABS: 40.0000 mg | ORAL_TABLET | Freq: Every day | ORAL | 0 refills | Status: DC

## 2020-11-14 MED ORDER — THIAMINE HCL 100 MG PO TABS: 100.0000 mg | ORAL_TABLET | Freq: Every day | ORAL | 0 refills | Status: DC

## 2020-11-14 MED ORDER — SPIRONOLACTONE 100 MG PO TABS: 100.0000 mg | ORAL_TABLET | Freq: Every day | ORAL | 0 refills | Status: DC

## 2020-11-14 MED ORDER — ADULT MULTIVITAMIN W/MINERALS CH: 1.0000 | ORAL_TABLET | Freq: Every day | ORAL | 0 refills | Status: DC

## 2020-11-14 MED ORDER — LACTULOSE 10 GM/15ML PO SOLN: 10.0000 g | Freq: Two times a day (BID) | ORAL | 0 refills | Status: DC

## 2020-11-14 MED ORDER — FOLIC ACID 1 MG PO TABS: 1.0000 mg | ORAL_TABLET | Freq: Every day | ORAL | 0 refills | Status: DC

## 2020-11-14 MED ORDER — NICOTINE 21 MG/24HR TD PT24: 21.0000 mg | MEDICATED_PATCH | Freq: Every day | TRANSDERMAL | 0 refills | Status: DC

## 2020-11-14 MED ORDER — LACTULOSE 10 GM/15ML PO SOLN
10.0000 g | Freq: Two times a day (BID) | ORAL | Status: DC
  Administered 2020-11-14: 10 g via ORAL
  Filled 2020-11-14: qty 15

## 2020-11-14 NOTE — Evaluation (Signed)
Physical Therapy Evaluation Patient Details Name: Michael Patton MRN: [id number] DOB: September 02, 1956 Today's Date: 11/14/2020   History of Present Illness  65 y.o. male presented to ED 11/12/20 with increased swelling of legs and abdomen for 2+ months.  Found to have large amount of ascites. Pt reports he typically drinks 6-8 cans of beer/day. Admitted for treatement of hyponatremia, decompenstated liver failure and ascites. s/p paracentisis and removal of 3L of clear yellow fluid. PMH: chronic hepatitis C untreated, COPD cigarette smoker, chronic alcohol abuse  Clinical Impression  Patient evaluated by Physical Therapy with no further acute PT needs identified. All education has been completed and the patient has no further questions. Pt has no follow-up Physical Therapy or equipment needs. PT is signing off. Thank you for this referral.     Follow Up Recommendations No PT follow up    Equipment Recommendations  None recommended by PT       Precautions / Restrictions        Mobility  Bed Mobility               General bed mobility comments: sitting in recliner on entry    Transfers Overall transfer level: Modified independent               General transfer comment: increased time to power up uses arm rests and is able to self steady  Ambulation/Gait Ambulation/Gait assistance: Modified independent (Device/Increase time) Gait Distance (Feet): 500 Feet Assistive device: None Gait Pattern/deviations: Step-through pattern;Decreased step length - right;Decreased step length - left;Shuffle Gait velocity: variable Gait velocity interpretation: 1.31 - 2.62 ft/sec, indicative of limited community ambulator General Gait Details: pt with slippers on feet requiring decreased foot clearance to keep on, steady gait that decreased in velocity with distance however overall steady      Balance Overall balance assessment: Mild deficits observed, not formally tested                                            Pertinent Vitals/Pain Pain Assessment: No/denies pain    Home Living Family/patient expects to be discharged to:: Other (Comment) (Boarding house)                 Additional Comments: 4 steps to enter    Prior Function Level of Independence: Independent                  Extremity/Trunk Assessment   Upper Extremity Assessment Upper Extremity Assessment: Generalized weakness    Lower Extremity Assessment Lower Extremity Assessment: Generalized weakness       Communication   Communication: No difficulties  Cognition Arousal/Alertness: Awake/alert Behavior During Therapy: WFL for tasks assessed/performed Overall Cognitive Status: Within Functional Limits for tasks assessed                                               Assessment/Plan    PT Assessment Patent does not need any further PT services         PT Goals (Current goals can be found in the Care Plan section)  Acute Rehab PT Goals Patient Stated Goal: get life straight no more beer and better eating PT Goal Formulation: With patient     AM-PAC PT "6 Clicks" Mobility  Outcome Measure Help needed turning from your back to your side while in a flat bed without using bedrails?: None Help needed moving from lying on your back to sitting on the side of a flat bed without using bedrails?: None Help needed moving to and from a bed to a chair (including a wheelchair)?: None Help needed standing up from a chair using your arms (e.g., wheelchair or bedside chair)?: None Help needed to walk in hospital room?: None Help needed climbing 3-5 steps with a railing? : None 6 Click Score: 24    End of Session   Activity Tolerance: Patient tolerated treatment well Patient left: in chair Nurse Communication: Mobility status PT Visit Diagnosis: Other abnormalities of gait and mobility (R26.89)    Time: 3300-7622 PT Time Calculation (min) (ACUTE  ONLY): 19 min   Charges:   PT Evaluation $PT Eval Low Complexity: 1 Low          Elizabeth B. Beverely Risen PT, DPT Acute Rehabilitation Services Pager [phone] Office [phone]   Elon Alas Fleet 11/14/2020, 3:01 PM

## 2020-11-14 NOTE — Discharge Instructions (Signed)
[facility]) Get CBC, CMP,ammonia checked  by Primary MD next visit.    Activity: As tolerated with Full fall precautions use walker/cane & assistance as needed   Disposition Home    Diet: low salt diet   On your next visit with your primary care physician please Get Medicines reviewed and adjusted.   Please request your Prim.MD to go over all Hospital Tests and Procedure/Radiological results at the follow up, please get all Hospital records sent to your Prim MD by signing hospital release before you go home.   If you experience worsening of your admission symptoms, develop shortness of breath, life threatening emergency, suicidal or homicidal thoughts you must seek medical attention immediately by calling 911 or calling your MD immediately  if symptoms less severe.  You Must read complete instructions/literature along with all the possible adverse reactions/side effects for all the Medicines you take and that have been prescribed to you. Take any new Medicines after you have completely understood and accpet all the possible adverse reactions/side effects.   Do not drive, operating heavy machinery, perform activities at heights, swimming or participation in water activities or provide baby sitting services if your were admitted for syncope or siezures until you have seen by Primary MD or a Neurologist and advised to do so again.  Do not drive when taking Pain medications.    Do not take more than prescribed Pain, Sleep and Anxiety Medications  Special Instructions: If you have smoked or chewed Tobacco  in the last 2 yrs please stop smoking, stop any regular Alcohol  and or any Recreational drug use.  Wear Seat belts while driving.   Please note  You were cared for by a hospitalist during your hospital stay. If you have any questions about your discharge medications or the care you received while you were in the hospital after you are discharged, you can call  the unit and asked to speak with the hospitalist on call if the hospitalist that took care of you is not available. Once you are discharged, your primary care physician will handle any further medical issues. Please note that NO REFILLS for any discharge medications will be authorized once you are discharged, as it is imperative that you return to your primary care physician (or establish a relationship with a primary care physician if you do not have one) for your aftercare needs so that they can reassess your need for medications and monitor your lab values.

## 2020-11-14 NOTE — Clinical Social Work Note (Signed)
Clinical Social Work Assessment  Patient Details  Name: Michael Patton MRN: [id number] Date of Birth: [birth year]   Social Worker assessment / plan:   CSW received consult for substance use and potential homelessness. CSW spoke with patient.  The patient reports that he shares a house with six other people.  He reported that his room mate would transport him home upon discharge if he is discharged at 2:30.  CSW spoke with the patient about alcohol use and seeking services that assist with refraining from using alcohol. Patient reported a primary focus after discharge of removing food items in the home that are not sodium free, replacing them with fruits and vegetables, and going to his PCP.  The patient stated that his priority is changing his diet, and that he is willing to look at a list of substance use resources.  CSW gave the patient a list of agencies that assist with substance use.  Patient expressed a desire to make better decisions about his health and stated that he is choosing to "live" after speaking with the doctor and receiving his prognosis.  No further questions reported at this time.    Catalina Pizza Jackob [place], LCSWA 11/14/2020, 10:21 AM

## 2020-11-14 NOTE — Discharge Summary (Signed)
Michael Patton, is a 65 y.o. male  DOB [birth year]  MRN [id number].  Admission date:  11/12/2020  Admitting Physician  Emeline General, MD  Discharge Date:  11/14/2020   Primary MD  Patient, No Pcp Per  Recommendations for primary care physician for things to follow:  -Please continue counseling about alcohol abuse and cessation -Please refer patient for hepatitis C treatment, he is currently agreeable. -Monitor liver enzymes and ammonia level closely.  Needed BMP closely as he started on Lasix and Aldactone. -    Admission Diagnosis  Hyponatremia [E87.1] Ascites due to alcoholic cirrhosis (HCC) [K70.31]   Discharge Diagnosis  Hyponatremia [E87.1] Ascites due to alcoholic cirrhosis (HCC) [K70.31]    Active Problems:   Hyponatremia      Past Medical History:  Diagnosis Date  . Alcoholism (HCC)   . COPD (chronic obstructive pulmonary disease) (HCC)   . Depression   . Hepatitis C     Past Surgical History:  Procedure Laterality Date  . IR PARACENTESIS  11/12/2020       History of present illness and  Hospital Course:     Kindly see H&P for history of present illness and admission details, please review complete Labs, Consult reports and Test reports for all details in brief  HPI  from the history and physical done on the day of admission 11/12/2020   HPI: Michael Patton is a 65 y.o. male with medical history significant of chronic hepatitis C untreated, COPD cigarette smoker, chronic alcohol abuse, presented with increased swelling of legs and abdomen for 2+ months.  Patient was diagnosed with hepatitis C about 5 years ago at the [place], but has never been treated.  Drinks 6-8 cans of 12 oz beer every day and last drink was last night. But he denied any Hx of EtOH withdrawal. He started to notice his ankles were started to swell about 2-3 months ago and gradually getting worse and slowly  swelling went up and now his abdomen were swelling too "feels so stretchy". He denies any nausea vomiting, diarrhea, no dark-colored stool.  But he did not notice easy bruising.  He has been feeling very weak and heavy with distended belly and [facility], so called [facility] instead.  ED Course: Large amount of ascites on physical exam, blood work sodium 122, alcohol 70, platelets 72.  Albumin 2.9, hemoglobin 12.3.  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  This has been gradually improving, was 122 on admissions, it is 127 on discharge today.  Generalized weakness -Probably related to hyponatremia and worsening of cirrhosis -This has improved  Decompensated liver cirrhosis with ascites, hyperammonemia (no encephalopathy ) in the setting of alcohol abuse and HCV. -With evidence of coagulopathy, received IV vitamin K. -With ascites, status post paracentesis, and IV albumin. -Continue with 2 g sodium diet. -He is started on Lasix 40 mg daily, Aldactone 100 mg daily,  ammonia level was elevated on admission, this has been improving, he will be started on low-dose lactulose as well -[facility] for varices.  Ascites -Status post 3 L paracentesis  COPD with mildexacerbation -Patient has not been compliant with COPD medications.  Chronic hep C - Discussed with patient regarding HCV treatment, report he was offered it in the past through his [place] but he declined, report he will [facility].  Cigarette smoker -Nicotine patch  Alcohol abuse -Patient was kept on CIWA protocol during hospital stay, no evidence of withdrawals -He was counseled.   Discharge Condition:  Stable   Discharge Instructions  and  Discharge Medications     Discharge Instructions    Diet - low sodium heart healthy   Complete  by: As directed    Discharge instructions   Complete by: As directed    [facility]) Get CBC, CMP,ammonia checked  by Primary MD next visit.    Activity: As tolerated with Full fall precautions use walker/cane & assistance as needed   Disposition Home    Diet: low salt diet   On your next visit with your primary care physician please Get Medicines reviewed and adjusted.   Please request your Prim.MD to go over all Hospital Tests and Procedure/Radiological results at the follow up, please get all Hospital records sent to your Prim MD by signing hospital release before you go home.   If you experience worsening of your admission symptoms, develop shortness of breath, life threatening emergency, suicidal or homicidal thoughts you must seek medical attention immediately by calling 911 or calling your MD immediately  if symptoms less severe.  You Must read complete instructions/literature along with all the possible adverse reactions/side effects for all the Medicines you take and that have been prescribed to you. Take any new Medicines after you have completely understood and accpet all the possible adverse reactions/side effects.   Do not drive, operating heavy machinery, perform activities at heights, swimming or participation in water activities or provide baby sitting services if your were admitted for syncope or siezures until you have seen by Primary MD or a Neurologist and advised to do so again.  Do not drive when taking Pain medications.    Do not take more than prescribed Pain, Sleep and Anxiety Medications  Special Instructions: If you have smoked or chewed Tobacco  in the last 2 yrs please stop smoking, stop any regular Alcohol  and or any Recreational drug use.  Wear Seat belts while driving.   Please note  You were cared for by a hospitalist during your hospital stay. If you have any questions about your discharge medications or the care you  received while you were in the hospital after you are discharged, you can call the unit and asked to speak with the hospitalist on call if the hospitalist that took care of you is not available. Once you are discharged, your primary care physician will handle any further medical issues. Please note that NO REFILLS for any discharge medications will be authorized once you are discharged, as it is imperative that you return to your primary care physician (or establish a relationship with a primary care physician if you do not have one) for your aftercare needs so that they can reassess your need for medications and monitor your lab values.   Increase activity slowly   Complete by:  As directed      Allergies as of 11/14/2020      Reactions   Sertraline Rash      Medication List    STOP taking these medications   GOODY HEADACHE PO     TAKE these medications   folic acid 1 MG tablet Commonly known as: FOLVITE Take 1 tablet (1 mg total) by mouth daily. Start taking on: November 15, 2020   furosemide 40 MG tablet Commonly known as: Lasix Take 1 tablet (40 mg total) by mouth daily.   lactulose 10 GM/15ML solution Commonly known as: CHRONULAC Take 15 mLs (10 g total) by mouth 2 (two) times daily. Hold for diarrhea, or more than 3 loose bowel movements per day   multivitamin with minerals Tabs tablet Take 1 tablet by mouth daily. Start taking on: November 15, 2020   nicotine 21 mg/24hr patch Commonly known as: NICODERM CQ - dosed in mg/24 hours Place 1 patch (21 mg total) onto the skin daily. Start taking on: November 15, 2020   spironolactone 100 MG tablet Commonly known as: ALDACTONE Take 1 tablet (100 mg total) by mouth daily. Start taking on: November 15, 2020   thiamine 100 MG tablet Take 1 tablet (100 mg total) by mouth daily. Start taking on: November 15, 2020   VISINE-A OP Apply 1 drop to eye daily as needed (for dry eyes).         Diet and Activity recommendation: See Discharge  Instructions above   Consults obtained -  GI   Major procedures and Radiology Reports - PLEASE review detailed and final reports for all details, in brief -      CT ABDOMEN PELVIS W CONTRAST  Result Date: 11/12/2020 CLINICAL DATA:  Ascites, assess for esophageal varices EXAM: CT ABDOMEN AND PELVIS WITH CONTRAST TECHNIQUE: Multidetector CT imaging of the abdomen and pelvis was performed using the standard protocol following bolus administration of intravenous contrast. CONTRAST:  100mL OMNIPAQUE IOHEXOL 300 MG/ML  SOLN COMPARISON:  10/15/2012 FINDINGS: Lower chest: Lung bases are clear. Hepatobiliary: Heterogeneous enhancement with suspected micronodular cirrhosis. No dominant lesion. Gallbladder is unremarkable. No intrahepatic or extrahepatic ductal dilatation. Pancreas: Within normal limits. Spleen: Normal in size. Adrenals/Urinary Tract: Adrenal glands are within normal limits. Kidneys are within normal limits.  No hydronephrosis. Bladder is within normal limits. Stomach/Bowel: Stomach is within normal limits. No evidence of bowel obstruction. Appendix is not discretely visualized. Wall thickening involving the cecum/ascending colon, favoring passive venous congestion. Vascular/Lymphatic: No evidence of abdominal aortic aneurysm. Atherosclerotic calcifications of the abdominal aorta and branch vessels. Portal vein is patent.  No dominant gastroesophageal varices. Reproductive: Prostate is notable for dystrophic calcifications and a 16 mm left anterior cyst. Other: Moderate abdominopelvic ascites. Musculoskeletal: Mild degenerative changes at L5-S1. IMPRESSION: Suspected micronodular cirrhosis.  No dominant hepatic lesion. Moderate abdominopelvic ascites. Portal vein is patent. No dominant gastroesophageal varices. Spleen is normal in size. Wall thickening involving the cecum/ascending colon, favoring passive venous congestion. Electronically Signed   By: Charline BillsSriyesh  Krishnan M.D.   On: 11/12/2020 14:56    DG Abdomen Acute W/Chest  Result Date: 11/12/2020 CLINICAL DATA:  Ascites, tachycardia EXAM: DG ABDOMEN ACUTE WITH 1 VIEW CHEST COMPARISON:  None. FINDINGS: Cardiac and mediastinal contours normal. Atherosclerotic calcification aortic arch. Pulmonary vascularity normal. Mild linear densities in the lung bases bilaterally most likely atelectasis. Normal bowel gas pattern. No obstruction or ileus. No free air. Negative for urinary tract calculi. Diffuse ground-glass density the abdomen may represent ascites. No acute  skeletal abnormality. IMPRESSION: Mild bibasilar atelectasis or scarring Normal bowel gas pattern.  Possible ascites. Electronically Signed   By: Marlan Palau M.D.   On: 11/12/2020 10:43   IR Paracentesis  Result Date: 11/12/2020 INDICATION: Abdominal distention, suspected cirrhosis. Ascites. Request for diagnostic and therapeutic paracentesis. EXAM: ULTRASOUND GUIDED LEFT LOWER QUADRANT PARACENTESIS MEDICATIONS: None. COMPLICATIONS: None immediate. PROCEDURE: Informed written consent was obtained from the patient after a discussion of the risks, benefits and alternatives to treatment. A timeout was performed prior to the initiation of the procedure. Initial ultrasound scanning demonstrates a large amount of ascites within the left lower abdominal quadrant. The left lower abdomen was prepped and draped in the usual sterile fashion. 1% lidocaine was used for local anesthesia. Following this, a 19 gauge, 7-cm, Yueh catheter was introduced. An ultrasound image was saved for documentation purposes. The paracentesis was performed. The catheter was removed and a dressing was applied. The patient tolerated the procedure well without immediate post procedural complication. FINDINGS: A total of approximately 3 L of clear yellow fluid was removed. Samples were sent to the laboratory as requested by the clinical team. IMPRESSION: Successful ultrasound-guided paracentesis yielding 3 liters of peritoneal  fluid. Read by: Brayton El PA-C Electronically Signed   By: Simonne Come M.D.   On: 11/12/2020 15:22    Micro Results    Recent Results (from the past 240 hour(s))  Resp Panel by RT-PCR (Flu A&B, Covid) Nasopharyngeal Swab     Status: None   Collection Time: 11/12/20 10:14 AM   Specimen: Nasopharyngeal Swab; Nasopharyngeal(NP) swabs in vial transport medium  Result Value Ref Range Status   SARS Coronavirus 2 by RT PCR NEGATIVE NEGATIVE Final    Comment: (NOTE) SARS-CoV-2 target nucleic acids are NOT DETECTED.  The SARS-CoV-2 RNA is generally detectable in upper respiratory specimens during the acute phase of infection. The lowest concentration of SARS-CoV-2 viral copies this assay can detect is 138 copies/mL. A negative result does not preclude SARS-Cov-2 infection and should not be used as the sole basis for treatment or other patient management decisions. A negative result may occur with  improper specimen collection/handling, submission of specimen other than nasopharyngeal swab, presence of viral mutation(s) within the areas targeted by this assay, and inadequate number of viral copies(<138 copies/mL). A negative result must be combined with clinical observations, patient history, and epidemiological information. The expected result is Negative.  Fact Sheet for Patients:  BloggerCourse.com  Fact Sheet for Healthcare Providers:  SeriousBroker.it  This test is no t yet approved or cleared by the [place] FDA and  has been authorized for detection and/or diagnosis of SARS-CoV-2 by FDA under an Emergency Use Authorization (EUA). This EUA will remain  in effect (meaning this test can be used) for the duration of the COVID-19 declaration under Section 564(b)(1) of the Act, 21 U.S.C.section 360bbb-3(b)(1), unless the authorization is terminated  or revoked sooner.       Influenza A by PCR NEGATIVE NEGATIVE Final    Influenza B by PCR NEGATIVE NEGATIVE Final    Comment: (NOTE) The Xpert Xpress SARS-CoV-2/FLU/RSV plus assay is intended as an aid in the diagnosis of influenza from Nasopharyngeal swab specimens and should not be used as a sole basis for treatment. Nasal washings and aspirates are unacceptable for Xpert Xpress SARS-CoV-2/FLU/RSV testing.  Fact Sheet for Patients: BloggerCourse.com  Fact Sheet for Healthcare Providers: SeriousBroker.it  This test is not yet approved or cleared by the [place] and has been authorized for  detection and/or diagnosis of SARS-CoV-2 by FDA under an Emergency Use Authorization (EUA). This EUA will remain in effect (meaning this test can be used) for the duration of the COVID-19 declaration under Section 564(b)(1) of the Act, 21 U.S.C. section 360bbb-3(b)(1), unless the authorization is terminated or revoked.  Performed at [facility], [address], [place], [place] [address]        Today   Subjective:   Michael Patton today has no headache,no chest or abdominal pain,no new weakness tingling or numbness, feels much better wants to go home today.  No tremors or signs of withdrawals.  Objective:   Blood pressure 115/81, pulse 95, temperature 97.8 F (36.6 C), temperature source Axillary, resp. rate 17, height  (1.727 m), weight 78.2 kg, SpO2 93 %.   Intake/Output Summary (Last 24 hours) at 11/14/2020 1136 Last data filed at 11/13/2020 1200 Gross per 24 hour  Intake 240 ml  Output --  Net 240 ml    Exam Awake Alert, Oriented x 3, No new F.N deficits, Normal affect, no tremors Symmetrical Chest wall movement, Good air movement bilaterally, CTAB RRR,No Gallops,Rubs or new Murmurs, No Parasternal Heave +ve B.Sounds, Abd Soft, Non tender, No organomegaly appriciated, No rebound -guarding or rigidity. No Cyanosis, Clubbing or edema, No new Rash or bruise  Data Review   CBC  w Diff:  Lab Results  Component Value Date   WBC 7.3 11/14/2020   HGB 10.8 (L) 11/14/2020   HCT 29.7 (L) 11/14/2020   PLT 72 (L) 11/14/2020    CMP:  Lab Results  Component Value Date   NA 127 (L) 11/14/2020   K 3.5 11/14/2020   CL 96 (L) 11/14/2020   CO2 24 11/14/2020   BUN 6 (L) 11/14/2020   CREATININE 0.60 (L) 11/14/2020   PROT 7.8 11/14/2020   ALBUMIN 2.1 (L) 11/14/2020   BILITOT 2.6 (H) 11/14/2020   ALKPHOS 69 11/14/2020   AST 55 (H) 11/14/2020   ALT 20 11/14/2020  .   Total Time in preparing paper work, data evaluation and todays exam - 35 minutes  Huey Bienenstock M.D on 11/14/2020 at 11:36 AM  [facility]   Office  [phone]

## 2020-12-11 ENCOUNTER — Encounter (HOSPITAL_COMMUNITY): Payer: Self-pay

## 2020-12-11 ENCOUNTER — Inpatient Hospital Stay (HOSPITAL_COMMUNITY)
Admission: EM | Admit: 2020-12-11 | Discharge: 2020-12-14 | DRG: 641 | Disposition: A | Discharge status: Home / Self Care | Payer: Medicare Other | Attending: Family Medicine | Admitting: Family Medicine

## 2020-12-11 DIAGNOSIS — E861 Hypovolemia: Secondary | ICD-10-CM | POA: Diagnosis present

## 2020-12-11 DIAGNOSIS — Z79899 Other long term (current) drug therapy: Secondary | ICD-10-CM

## 2020-12-11 DIAGNOSIS — J449 Chronic obstructive pulmonary disease, unspecified: Secondary | ICD-10-CM

## 2020-12-11 DIAGNOSIS — Z20822 Contact with and (suspected) exposure to covid-19: Secondary | ICD-10-CM | POA: Diagnosis present

## 2020-12-11 DIAGNOSIS — Z888 Allergy status to other drugs, medicaments and biological substances status: Secondary | ICD-10-CM | POA: Diagnosis not present

## 2020-12-11 DIAGNOSIS — F1011 Alcohol abuse, in remission: Secondary | ICD-10-CM | POA: Diagnosis present

## 2020-12-11 DIAGNOSIS — K7031 Alcoholic cirrhosis of liver with ascites: Secondary | ICD-10-CM

## 2020-12-11 DIAGNOSIS — R296 Repeated falls: Secondary | ICD-10-CM | POA: Diagnosis present

## 2020-12-11 DIAGNOSIS — B1920 Unspecified viral hepatitis C without hepatic coma: Secondary | ICD-10-CM | POA: Diagnosis present

## 2020-12-11 DIAGNOSIS — F17210 Nicotine dependence, cigarettes, uncomplicated: Secondary | ICD-10-CM | POA: Diagnosis present

## 2020-12-11 DIAGNOSIS — E871 Hypo-osmolality and hyponatremia: Secondary | ICD-10-CM | POA: Diagnosis present

## 2020-12-11 DIAGNOSIS — B182 Chronic viral hepatitis C: Secondary | ICD-10-CM | POA: Diagnosis not present

## 2020-12-11 LAB — CBC WITH DIFFERENTIAL/PLATELET
Abs Immature Granulocytes: 0.04 10*3/uL (ref 0.00–0.07)
Basophils Absolute: 0.1 10*3/uL (ref 0.0–0.1)
Basophils Relative: 1 %
Eosinophils Absolute: 0.1 10*3/uL (ref 0.0–0.5)
Eosinophils Relative: 1 %
HCT: 35.1 % — ABNORMAL LOW (ref 39.0–52.0)
Hemoglobin: 12.9 g/dL — ABNORMAL LOW (ref 13.0–17.0)
Immature Granulocytes: 1 %
Lymphocytes Relative: 30 %
Lymphs Abs: 2.3 10*3/uL (ref 0.7–4.0)
MCH: 33.2 pg (ref 26.0–34.0)
MCHC: 36.8 g/dL — ABNORMAL HIGH (ref 30.0–36.0)
MCV: 90.2 fL (ref 80.0–100.0)
Monocytes Absolute: 0.8 10*3/uL (ref 0.1–1.0)
Monocytes Relative: 10 %
Neutro Abs: 4.4 10*3/uL (ref 1.7–7.7)
Neutrophils Relative %: 57 %
Platelets: 163 10*3/uL (ref 150–400)
RBC: 3.89 MIL/uL — ABNORMAL LOW (ref 4.22–5.81)
RDW: 13.7 % (ref 11.5–15.5)
WBC: 7.7 10*3/uL (ref 4.0–10.5)
nRBC: 0 % (ref 0.0–0.2)

## 2020-12-11 LAB — COMPREHENSIVE METABOLIC PANEL
ALT: 33 U/L (ref 0–44)
AST: 54 U/L — ABNORMAL HIGH (ref 15–41)
Albumin: 2.9 g/dL — ABNORMAL LOW (ref 3.5–5.0)
Alkaline Phosphatase: 64 U/L (ref 38–126)
Anion gap: 8 (ref 5–15)
BUN: 8 mg/dL (ref 8–23)
CO2: 21 mmol/L — ABNORMAL LOW (ref 22–32)
Calcium: 8.2 mg/dL — ABNORMAL LOW (ref 8.9–10.3)
Chloride: 82 mmol/L — ABNORMAL LOW (ref 98–111)
Creatinine, Ser: 0.77 mg/dL (ref 0.61–1.24)
GFR, Estimated: >60 mL/min (ref >60)
Glucose, Bld: 121 mg/dL — ABNORMAL HIGH (ref 70–99)
Potassium: 3.9 mmol/L (ref 3.5–5.1)
Sodium: 111 mmol/L — CL (ref 135–145)
Total Bilirubin: 2.2 mg/dL — ABNORMAL HIGH (ref 0.3–1.2)
Total Protein: 9.3 g/dL — ABNORMAL HIGH (ref 6.5–8.1)

## 2020-12-11 LAB — SODIUM, URINE, RANDOM: Sodium, Ur: 14 mmol/L

## 2020-12-11 LAB — SODIUM: Sodium: 113 mmol/L — CL (ref 135–145)

## 2020-12-11 LAB — OSMOLALITY, URINE: Osmolality, Ur: 236 mOsm/kg — ABNORMAL LOW (ref 300–900)

## 2020-12-11 LAB — PROTIME-INR
INR: 1.5 — ABNORMAL HIGH (ref 0.8–1.2)
Prothrombin Time: 17.3 seconds — ABNORMAL HIGH (ref 11.4–15.2)

## 2020-12-11 MED ORDER — ACETAMINOPHEN 325 MG PO TABS: 325.0000 mg | ORAL_TABLET | Freq: Four times a day (QID) | ORAL | Status: DC | PRN

## 2020-12-11 MED ORDER — FOLIC ACID 1 MG PO TABS
1.0000 mg | ORAL_TABLET | Freq: Every day | ORAL | Status: DC
  Administered 2020-12-12 – 2020-12-14 (×3): 1 mg via ORAL
  Filled 2020-12-11 (×3): qty 1

## 2020-12-11 MED ORDER — SODIUM CHLORIDE 0.9 % IV SOLN: INTRAVENOUS | Status: DC

## 2020-12-11 MED ORDER — ALBUTEROL SULFATE HFA 108 (90 BASE) MCG/ACT IN AERS
2.0000 | INHALATION_SPRAY | RESPIRATORY_TRACT | Status: DC | PRN
  Filled 2020-12-11: qty 6.7

## 2020-12-11 MED ORDER — SENNOSIDES-DOCUSATE SODIUM 8.6-50 MG PO TABS: 1.0000 | ORAL_TABLET | Freq: Every evening | ORAL | Status: DC | PRN

## 2020-12-11 MED ORDER — ONDANSETRON HCL 4 MG/2ML IJ SOLN: 4.0000 mg | Freq: Four times a day (QID) | INTRAMUSCULAR | Status: DC | PRN

## 2020-12-11 MED ORDER — LACTULOSE 10 GM/15ML PO SOLN
10.0000 g | Freq: Two times a day (BID) | ORAL | Status: DC
  Administered 2020-12-11 – 2020-12-14 (×5): 10 g via ORAL
  Filled 2020-12-11 (×5): qty 15
  Filled 2020-12-11: qty 30

## 2020-12-11 MED ORDER — SODIUM CHLORIDE 0.9 % IV BOLUS
1000.0000 mL | Freq: Once | INTRAVENOUS | Status: AC
  Administered 2020-12-11: 1000 mL via INTRAVENOUS

## 2020-12-11 MED ORDER — ENOXAPARIN SODIUM 40 MG/0.4ML ~~LOC~~ SOLN
40.0000 mg | SUBCUTANEOUS | Status: DC
  Administered 2020-12-11 – 2020-12-13 (×3): 40 mg via SUBCUTANEOUS
  Filled 2020-12-11 (×3): qty 0.4

## 2020-12-11 MED ORDER — THIAMINE HCL 100 MG PO TABS
100.0000 mg | ORAL_TABLET | Freq: Every day | ORAL | Status: DC
  Administered 2020-12-12 – 2020-12-14 (×3): 100 mg via ORAL
  Filled 2020-12-11 (×3): qty 1

## 2020-12-11 MED ORDER — SODIUM CHLORIDE 3 % IV SOLN: INTRAVENOUS | Status: DC

## 2020-12-11 MED ORDER — ONDANSETRON HCL 4 MG PO TABS: 4.0000 mg | ORAL_TABLET | Freq: Four times a day (QID) | ORAL | Status: DC | PRN

## 2020-12-11 NOTE — H&P (Addendum)
History and Physical    Michael Patton GBT:[id number] DOB: [birth year] DOA: 12/11/2020  PCP: Patient, No Pcp Per (Inactive)   Patient coming from: Home   Chief Complaint: Dizziness   HPI: Michael Patton is a 65 y.o. 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  Patient was admitted to the hospital with decompensated liver cirrhosis last month and discharged on 11/14/2020.  Since discharge, he has not had any alcohol, has followed strict low-salt diet, and has been taking Lasix and spironolactone.  He began to feel dizzy approximately 1 week after hospital discharge.  Dizzy spells have become more frequent and severe over the past 3 weeks.  He is essentially asymptomatic when seated or lying down but develops dizziness when he tries to ambulate.  He has not had any recurrence in abdominal distention since the paracentesis last month and reports that his pedal edema has resolved since starting diuretics.  Denies any headache, change in vision or hearing, or focal numbness or weakness.  He has not had any loss of consciousness or seizures.  ED Course: Upon arrival to the ED, patient is found to be afebrile, saturating well on room air, and with blood pressure 96/65.  EKG features sinus rhythm.  Chemistry panel notable for sodium 111, albumin 2.9, AST 54, and bilirubin 2.2.  INR is 1.5.  Patient was given a liter of saline in the ED.  Review of Systems:  All other systems reviewed and apart from HPI, are negative.  Past Medical History:  Diagnosis Date  . Alcoholism (HCC)   . COPD (chronic obstructive pulmonary disease) (HCC)   . Depression   . Hepatitis C     Past Surgical History:  Procedure Laterality Date  . IR PARACENTESIS  11/12/2020    Social History:   reports that he has been smoking cigarettes. He has been smoking about 2.00 packs per day. He  has never used smokeless tobacco. He reports current alcohol use of about 50.0 standard drinks of alcohol per week. He reports that he does not use drugs.  Allergies  Allergen Reactions  . Sertraline Rash    History reviewed. No pertinent family history.   Prior to Admission medications   Medication Sig Start Date End Date Taking? Authorizing Provider  folic acid (FOLVITE) 1 MG tablet Take 1 tablet (1 mg total) by mouth daily. 11/15/20   Elgergawy, Leana Roe, MD  furosemide (LASIX) 40 MG tablet Take 1 tablet (40 mg total) by mouth daily. 11/14/20 11/14/21  Elgergawy, Leana Roe, MD  lactulose (CHRONULAC) 10 GM/15ML solution Take 15 mLs (10 g total) by mouth 2 (two) times daily. Hold for diarrhea, or more than 3 loose bowel movements per day 11/14/20   Elgergawy, Leana Roe, MD  Multiple Vitamin (MULTIVITAMIN WITH MINERALS) TABS tablet Take 1 tablet by mouth daily. 11/15/20   Elgergawy, Leana Roe, MD  Naphazoline-Pheniramine (VISINE-A OP) Apply 1 drop to eye daily as needed (for dry eyes).    [provider]  nicotine (NICODERM CQ - DOSED IN MG/24 HOURS) 21 mg/24hr patch Place 1 patch (21 mg total) onto the skin daily. 11/15/20   Elgergawy, Leana Roe, MD  spironolactone (ALDACTONE) 100 MG tablet Take 1 tablet (100 mg total) by mouth daily. 11/15/20   Elgergawy, Leana Roe, MD  thiamine 100 MG tablet Take 1 tablet (100 mg total) by mouth daily. 11/15/20   Elgergawy,  Leana Roe, MD    Physical Exam: Vitals:   12/11/20 1047 12/11/20 1247 12/11/20 1504 12/11/20 1900  BP: 96/65 99/67 105/65 116/75  Pulse: (!) 114 76 79 73  Resp: 16 18 14  (!) 22  Temp: 98 F (36.7 C) 97.7 F (36.5 C) 97.6 F (36.4 C)   TempSrc: Oral Oral Oral   SpO2: 99% 99% (!) 76% 100%    Constitutional: NAD, calm  Eyes: PERTLA, lids and conjunctivae normal ENMT: Mucous membranes are moist. Posterior pharynx clear of any exudate or lesions.   Neck: normal, supple, no masses, no thyromegaly Respiratory:  no wheezing, no  crackles. No accessory muscle use.  Cardiovascular: S1 & S2 heard, regular rate and rhythm. No extremity edema.  Abdomen: soft, no tenderness. Bowel sounds active.  Musculoskeletal: no clubbing / cyanosis. No joint deformity upper and lower extremities.   Skin: ecchymoses and crusted lesions, angiomata. Warm, dry, well-perfused. Neurologic: CN 2-12 grossly intact. Sensation intact. Strength 5/5 in all 4 limbs.  Psychiatric: Alert and oriented to person, place, and situation. Pleasant and cooperative.   Labs and Imaging on Admission: I have personally reviewed following labs and imaging studies  CBC: Recent Labs  Lab 12/11/20 1054  WBC 7.7  NEUTROABS 4.4  HGB 12.9*  HCT 35.1*  MCV 90.2  PLT 163   Basic Metabolic Panel: Recent Labs  Lab 12/11/20 1054  NA 111*  K 3.9  CL 82*  CO2 21*  GLUCOSE 121*  BUN 8  CREATININE 0.77  CALCIUM 8.2*   GFR: CrCl cannot be calculated (Unknown ideal weight.). Liver Function Tests: Recent Labs  Lab 12/11/20 1054  AST 54*  ALT 33  ALKPHOS 64  BILITOT 2.2*  PROT 9.3*  ALBUMIN 2.9*   No results for input(s): LIPASE, AMYLASE in the last 168 hours. No results for input(s): AMMONIA in the last 168 hours. Coagulation Profile: Recent Labs  Lab 12/11/20 1054  INR 1.5*   Cardiac Enzymes: No results for input(s): CKTOTAL, CKMB, CKMBINDEX, TROPONINI in the last 168 hours. BNP (last 3 results) No results for input(s): PROBNP in the last 8760 hours. HbA1C: No results for input(s): HGBA1C in the last 72 hours. CBG: No results for input(s): GLUCAP in the last 168 hours. Lipid Profile: No results for input(s): CHOL, HDL, LDLCALC, TRIG, CHOLHDL, LDLDIRECT in the last 72 hours. Thyroid Function Tests: No results for input(s): TSH, T4TOTAL, FREET4, T3FREE, THYROIDAB in the last 72 hours. Anemia Panel: No results for input(s): VITAMINB12, FOLATE, FERRITIN, TIBC, IRON, RETICCTPCT in the last 72 hours. Urine analysis:    Component Value  Date/Time   COLORURINE YELLOW 10/15/2012 1610   APPEARANCEUR CLEAR 10/15/2012 1610   LABSPEC 1.034 (H) 10/15/2012 1610   PHURINE 5.5 10/15/2012 1610   GLUCOSEU 100 (A) 10/15/2012 1610   HGBUR NEGATIVE 10/15/2012 1610   BILIRUBINUR NEGATIVE 10/15/2012 1610   KETONESUR NEGATIVE 10/15/2012 1610   PROTEINUR NEGATIVE 10/15/2012 1610   UROBILINOGEN 1.0 10/15/2012 1610   NITRITE NEGATIVE 10/15/2012 1610   LEUKOCYTESUR NEGATIVE 10/15/2012 1610   Sepsis Labs: @LABRCNTIP (procalcitonin:4,lacticidven:4) )No results found for this or any previous visit (from the past 240 hour(s)).   Radiological Exams on Admission: No results found.  EKG: Independently reviewed. Sinus rhythm.   Assessment/Plan   1. Hyponatremia  - Presents with dizziness developing over the past 3 weeks and is found to have serum sodium 111 - He appears hypovolemic, has been following low-sodium diet and taking Lasix and Aldactone since hospitalization last month   -  He was given 1 liter NS in ED  - Restrict free-water, hold diuretics for now, start 3% saline at low rate, follow serial sodium levels with goal 4-6 mEq/L in 24 hours    ADDENDUM: notified of hospital policy that 3%, even at low rate is only allowed for severe symptoms. Plan to use NS instead, plan otherwise unchanged.    2. Cirrhosis  - He reports strict alcohol avoidance since admission last month, has not had any abdominal distension since paracentesis last month, INR has improved, he is not encephalopathic  - Continue lactulose, continue alcohol avoidance, hold diuretics for now    3. COPD  - Stable, no cough or wheezing on admission   4. Hepatitis C  - Has appointment scheduled for 5/17 at [facility] to start treatment    DVT prophylaxis: Lovenox  Code Status: Full  Level of Care: Level of care: Progressive Family Communication: none present  Disposition Plan:  Patient is from: Home  Anticipated d/c is to: Home Anticipated d/c date is: 12/14/20 Patient  currently: Pending improvement in hyponatremia  Consults called: None  Admission status: Inpatient     Briscoe Deutscher, MD [facility]  12/11/2020, 7:38 PM

## 2020-12-11 NOTE — ED Triage Notes (Signed)
Emergency Medicine Provider Triage Evaluation Note  Michael Patton , a 65 y.o. male  was evaluated in triage.  Pt complains of feeling lightheaded progressively worsening over the past month.  Patient ambulates with a cane, states that symptoms resolve if he is still or resting, worse with movement.  Denies numbness, weakness, visual disturbance or changes in speech..  Admitted to the hospital 1 month ago, diagnosed with cirrhosis, had paracentesis, take medications as prescribed.  Review of Systems  Positive: Lightheadedness Negative: Fall, injury, weakness, numbness, speech or vision disturbance  Physical Exam  There were no vitals taken for this visit. Gen:   Awake, no distress   HEENT:  Atraumatic Resp:  Normal effort Cardiac:  Normal rate Abd:   Nondistended, nontender  MSK:   Moves extremities without difficulty, ambulatory with cane Neuro:  Speech clear   Medical Decision Making  Medically screening exam initiated at 10:46 AM.  Appropriate orders placed.  Michael Patton was informed that the remainder of the evaluation will be completed by another provider, this initial triage assessment does not replace that evaluation, and the importance of remaining in the ED until their evaluation is complete.  Clinical Impression     Jeannie Fend, PA-C 12/11/20 1050

## 2020-12-11 NOTE — ED Notes (Signed)
RN paged Dr. Antionette Char per pt critical sodium result of 113

## 2020-12-11 NOTE — ED Triage Notes (Signed)
Pt c/o feeling lightheaded worse over the last month. Pt ambulates with cane . Pt hospitalize with cirrhosis.

## 2020-12-11 NOTE — ED Provider Notes (Signed)
[facility] EMERGENCY DEPARTMENT Provider Note   CSN: [id number] Arrival date & time: 12/11/20  1023     History Chief Complaint  Patient presents with  . Dizziness    Michael Patton is a 65 y.o. male.  Patient is a 65 year old male with a history of hepatitis C, cirrhosis, COPD and alcoholism who is presenting today due to sensation of dizziness and now having falls.  Patient reports he was hospitalized for 4 days in March when he had cirrhosis and ascites.  He was started on spironolactone and Lasix which he has been taking and has been following a very low sodium diet.  However he reports that slowly over the last month he has been having more episodes of dizziness.  He tries to stand up and walk and has to steady himself on the wall.  2 days ago he was going outside and the dizziness was severe causing him to fall and hit his face on the ground.  He had no loss of consciousness and denies any syncope with these events.  He has had no chest pain or shortness of breath.  His swelling has significantly improved and now he has no swelling in his legs or his stomach.  He has been having bowel movements regularly and continues to take the lactulose.  He has not drank any alcohol since leaving the hospital.  He does drink approximately 3-4 bottles of 16 ounce water per day and has been eating well.  He talked with his doctor because of the dizzy spells and thought they were related to the medications.  He had his blood pressure checked 2 days ago and had a sitting blood pressure of 140 in a standing of 100 with a heart rate jumped from 99-130.  After his fall 2 days ago he reported that he wanted to come to the hospital and find out what was going on.  He currently is asymptomatic while sitting in the bed.  The history is provided by the patient and medical records.  Dizziness      Past Medical History:  Diagnosis Date  . Alcoholism (HCC)   . COPD (chronic obstructive  pulmonary disease) (HCC)   . Depression   . Hepatitis C     Patient Active Problem List   Diagnosis Date Noted  . Hyponatremia 11/12/2020  . Ascites due to alcoholic cirrhosis (HCC)   . Uncontrolled pain 10/15/2012  . Tobacco abuse 10/15/2012  . Constipation 10/15/2012  . Alcohol abuse 10/15/2012  . CAP (community acquired pneumonia) 10/15/2012    Past Surgical History:  Procedure Laterality Date  . IR PARACENTESIS  11/12/2020       History reviewed. No pertinent family history.  Social History   Tobacco Use  . Smoking status: Current Every Day Smoker    Packs/day: 2.00    Types: Cigarettes  . Smokeless tobacco: Never Used  Substance Use Topics  . Alcohol use: Yes    Alcohol/week: 50.0 standard drinks    Types: 50 Cans of beer per week    Comment: Wkly   . Drug use: No    Types: "Crack" cocaine, Opium, Marijuana    Comment: Daily     Home Medications Prior to Admission medications   Medication Sig Start Date End Date Taking? Authorizing Provider  folic acid (FOLVITE) 1 MG tablet Take 1 tablet (1 mg total) by mouth daily. 11/15/20   Elgergawy, Leana Roe, MD  furosemide (LASIX) 40 MG tablet Take 1 tablet (  40 mg total) by mouth daily. 11/14/20 11/14/21  Elgergawy, Leana Roe, MD  lactulose (CHRONULAC) 10 GM/15ML solution Take 15 mLs (10 g total) by mouth 2 (two) times daily. Hold for diarrhea, or more than 3 loose bowel movements per day 11/14/20   Elgergawy, Leana Roe, MD  Multiple Vitamin (MULTIVITAMIN WITH MINERALS) TABS tablet Take 1 tablet by mouth daily. 11/15/20   Elgergawy, Leana Roe, MD  Naphazoline-Pheniramine (VISINE-A OP) Apply 1 drop to eye daily as needed (for dry eyes).    [provider]  nicotine (NICODERM CQ - DOSED IN MG/24 HOURS) 21 mg/24hr patch Place 1 patch (21 mg total) onto the skin daily. 11/15/20   Elgergawy, Leana Roe, MD  spironolactone (ALDACTONE) 100 MG tablet Take 1 tablet (100 mg total) by mouth daily. 11/15/20   Elgergawy, Leana Roe, MD   thiamine 100 MG tablet Take 1 tablet (100 mg total) by mouth daily. 11/15/20   Elgergawy, Leana Roe, MD    Allergies    Sertraline  Review of Systems   Review of Systems  Neurological: Positive for dizziness.  All other systems reviewed and are negative.   Physical Exam Updated Vital Signs BP 105/65 (BP Location: Left Arm)   Pulse 79   Temp 97.6 F (36.4 C) (Oral)   Resp 14   SpO2 (!) 76%   Physical Exam Vitals and nursing note reviewed.  Constitutional:      General: He is not in acute distress.    Appearance: He is well-developed.  HENT:     Head: Normocephalic and atraumatic.  Eyes:     Conjunctiva/sclera: Conjunctivae normal.     Pupils: Pupils are equal, round, and reactive to light.  Cardiovascular:     Rate and Rhythm: Normal rate and regular rhythm.     Heart sounds: No murmur heard.   Pulmonary:     Effort: Pulmonary effort is normal. No respiratory distress.     Breath sounds: Normal breath sounds. No wheezing or rales.  Abdominal:     General: There is no distension.     Palpations: Abdomen is soft.     Tenderness: There is no abdominal tenderness. There is no guarding or rebound.     Comments: No notable ascites  Musculoskeletal:        General: No tenderness. Normal range of motion.     Cervical back: Normal range of motion and neck supple.     Right lower leg: No edema.     Left lower leg: No edema.  Skin:    General: Skin is warm and dry.     Findings: No erythema or rash.  Neurological:     Mental Status: He is alert and oriented to person, place, and time.  Psychiatric:        Mood and Affect: Mood normal.        Behavior: Behavior normal.        Thought Content: Thought content normal.      ED Results / Procedures / Treatments   Labs (all labs ordered are listed, but only abnormal results are displayed) Labs Reviewed  COMPREHENSIVE METABOLIC PANEL - Abnormal; Notable for the following components:      Result Value   Sodium 111 (*)     Chloride 82 (*)    CO2 21 (*)    Glucose, Bld 121 (*)    Calcium 8.2 (*)    Total Protein 9.3 (*)    Albumin 2.9 (*)    AST 54 (*)  Total Bilirubin 2.2 (*)    All other components within normal limits  CBC WITH DIFFERENTIAL/PLATELET - Abnormal; Notable for the following components:   RBC 3.89 (*)    Hemoglobin 12.9 (*)    HCT 35.1 (*)    MCHC 36.8 (*)    All other components within normal limits  PROTIME-INR - Abnormal; Notable for the following components:   Prothrombin Time 17.3 (*)    INR 1.5 (*)    All other components within normal limits    EKG None  Radiology No results found.  Procedures Procedures   Medications Ordered in ED Medications  sodium chloride 0.9 % bolus 1,000 mL (has no administration in time range)    ED Course  I have reviewed the triage vital signs and the nursing notes.  Pertinent labs & imaging results that were available during my care of the patient were reviewed by me and considered in my medical decision making (see chart for details).    MDM Rules/Calculators/A&P                          Patient presenting today due to dizzy spells.  These are only present when he stands and tries to walk.  Patient is well-appearing on exam.  Recent admission less than 1 month ago for ascites and cirrhosis.  Patient has been on spironolactone and Lasix since that time.  He has been following a very low sodium diet and denies any abdominal swelling or leg swelling at this time.  Patient does appear to be orthostatic with standing based on recent blood pressure readings that he received.  Also today patient's sodium is significantly low at 111 from his baseline of 127.  Suspect that that is probably also adding into his dizziness.  He is drinking 3 to 4 16 ounce bottles of water per day but feel that that is probably not the cause of his hyponatremia more might be related to medication and low-sodium diet.  Patient given a bolus of saline.  No evidence of  seizures at this time.  However will need admission for hyponatremia and reversal.  May also need some medication adjustments.  Patient is not drinking any alcohol that would account for this low number.  MDM Number of Diagnoses or Management Options   Amount and/or Complexity of Data Reviewed Clinical lab tests: ordered and reviewed Tests in the medicine section of CPT: ordered and reviewed Decide to obtain previous medical records or to obtain history from someone other than the patient: yes Obtain history from someone other than the patient: yes Review and summarize past medical records: yes Independent visualization of images, tracings, or specimens: yes  Risk of Complications, Morbidity, and/or Mortality Presenting problems: high Diagnostic procedures: moderate Management options: moderate  Patient Progress Patient progress: stable  Final Clinical Impression(s) / ED Diagnoses Final diagnoses:  Hyponatremia    Rx / DC Orders ED Discharge Orders    None       Gwyneth Sprout, MD 12/11/20 1905

## 2020-12-11 NOTE — ED Notes (Signed)
Sandwich bag and water given to pt

## 2020-12-12 DIAGNOSIS — E871 Hypo-osmolality and hyponatremia: Secondary | ICD-10-CM | POA: Diagnosis not present

## 2020-12-12 LAB — COMPREHENSIVE METABOLIC PANEL
ALT: 29 U/L (ref 0–44)
AST: 45 U/L — ABNORMAL HIGH (ref 15–41)
Albumin: 2.4 g/dL — ABNORMAL LOW (ref 3.5–5.0)
Alkaline Phosphatase: 57 U/L (ref 38–126)
Anion gap: 6 (ref 5–15)
BUN: 6 mg/dL — ABNORMAL LOW (ref 8–23)
CO2: 23 mmol/L (ref 22–32)
Calcium: 8.2 mg/dL — ABNORMAL LOW (ref 8.9–10.3)
Chloride: 92 mmol/L — ABNORMAL LOW (ref 98–111)
Creatinine, Ser: 0.61 mg/dL (ref 0.61–1.24)
GFR, Estimated: >60 mL/min (ref >60)
Glucose, Bld: 93 mg/dL (ref 70–99)
Potassium: 3.6 mmol/L (ref 3.5–5.1)
Sodium: 121 mmol/L — ABNORMAL LOW (ref 135–145)
Total Bilirubin: 1.7 mg/dL — ABNORMAL HIGH (ref 0.3–1.2)
Total Protein: 7.7 g/dL (ref 6.5–8.1)

## 2020-12-12 LAB — CBC
HCT: 31.3 % — ABNORMAL LOW (ref 39.0–52.0)
Hemoglobin: 11.6 g/dL — ABNORMAL LOW (ref 13.0–17.0)
MCH: 33.4 pg (ref 26.0–34.0)
MCHC: 37.1 g/dL — ABNORMAL HIGH (ref 30.0–36.0)
MCV: 90.2 fL (ref 80.0–100.0)
Platelets: 144 10*3/uL — ABNORMAL LOW (ref 150–400)
RBC: 3.47 MIL/uL — ABNORMAL LOW (ref 4.22–5.81)
RDW: 13.6 % (ref 11.5–15.5)
WBC: 7.7 10*3/uL (ref 4.0–10.5)
nRBC: 0 % (ref 0.0–0.2)

## 2020-12-12 LAB — SARS CORONAVIRUS 2 (TAT 6-24 HRS): SARS Coronavirus 2: NEGATIVE

## 2020-12-12 LAB — SODIUM
Sodium: 116 mmol/L — CL (ref 135–145)
Sodium: 120 mmol/L — ABNORMAL LOW (ref 135–145)
Sodium: 120 mmol/L — ABNORMAL LOW (ref 135–145)
Sodium: 118 mmol/L — CL (ref 135–145)
Sodium: 118 mmol/L — CL (ref 135–145)

## 2020-12-12 MED ORDER — DESMOPRESSIN ACETATE 4 MCG/ML IJ SOLN
2.0000 ug | Freq: Once | INTRAMUSCULAR | Status: DC
  Filled 2020-12-12: qty 0.5

## 2020-12-12 MED ORDER — DESMOPRESSIN ACETATE 4 MCG/ML IJ SOLN
2.0000 ug | Freq: Once | INTRAMUSCULAR | Status: AC
  Administered 2020-12-12: 2 ug via SUBCUTANEOUS
  Filled 2020-12-12: qty 0.5

## 2020-12-12 MED ORDER — SODIUM CHLORIDE 0.9 % IV SOLN: INTRAVENOUS | Status: DC

## 2020-12-12 MED ORDER — DEXTROSE 5 % IV SOLN: INTRAVENOUS | Status: DC

## 2020-12-12 NOTE — Plan of Care (Signed)
  Problem: Health Behavior/Discharge Planning: Goal: Ability to manage health-related needs will improve Outcome: Progressing   Problem: Clinical Measurements: Goal: Ability to maintain clinical measurements within normal limits will improve Outcome: Progressing Goal: Diagnostic test results will improve Outcome: Progressing   Problem: Activity: Goal: Risk for activity intolerance will decrease Outcome: Progressing   Problem: Safety: Goal: Ability to remain free from injury will improve Outcome: Progressing   Problem: Skin Integrity: Goal: Risk for impaired skin integrity will decrease Outcome: Progressing

## 2020-12-12 NOTE — Progress Notes (Signed)
Patient admitted for hyponatremia, arrivd into the unit at about 0020 from ED. Pt is alert and oriented, able to answer all questions skin assessment done and documented in flowsheet. Vital signs stable on arrival. Patient able to walk with standby assist to the bathroom. Patirent was oriented to room and equipment. No sign of distress noted

## 2020-12-12 NOTE — Progress Notes (Signed)
   12/12/20 2057  Provider Notification  Provider Name/Title Trey Paula MD  Date Provider Notified 12/12/20  Time Provider Notified 2057  Notification Type Page (secure chat)  Notification Reason Critical result  Test performed and critical result Sodium level  Date Critical Result Received 12/12/20  Time Critical Result Received 2053  Provider response No new orders  Date of Provider Response 12/12/20  Time of Provider Response 2101

## 2020-12-12 NOTE — Progress Notes (Signed)
PROGRESS NOTE    Michael Patton  RDE:[id number] DOB: [birth year] DOA: 12/11/2020 PCP: Patient, No Pcp Per (Inactive)   Brief Narrative: This 65 y.o. 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  Patient was admitted to the hospital with decompensated liver cirrhosis last month and discharged on 11/14/2020.  Since discharge, he has not had any alcohol, has followed strict low-salt diet, and has been taking Lasix and spironolactone.  He began to feel dizzy approximately 1 week after hospital discharge.  Dizzy spells have become more frequent and severe over the past 3 weeks.  He is essentially asymptomatic when seated or lying down but develops dizziness when he tries to ambulate.  He has not had any recurrence in abdominal distention since the paracentesis last month and reports that his pedal edema has resolved since starting diuretics.  Denies any headache, change in vision or hearing, or focal numbness or weakness.  He has not had any loss of consciousness or seizures.  ED Course: Upon arrival to the ED, patient is found to be afebrile, saturating well on room air, and with blood pressure 96/65.  EKG features sinus rhythm.  Chemistry panel notable for sodium 111, albumin 2.9, AST 54, and bilirubin 2.2.  INR is 1.5.  Patient was given a liter of saline in the ED.    Assessment & Plan:   Principal Problem:   Hyponatremia Active Problems:   Alcoholic cirrhosis of liver with ascites (HCC)   COPD (chronic obstructive pulmonary disease) (HCC)   Hepatitis C    Hyponatremia : > Improving.  - Presents with dizziness developing over the past 3 weeks and is found to have serum sodium 111. - He appears hypovolemic, has been following low-sodium diet and taking Lasix and Aldactone since hospitalization last month   - He was given 1 liter NS in ED  - Restrict  free-water, hold diuretics for now, follow serial sodium levels with goal 4-6 mEq/L in 24 hours. -Serum sodium is improving it improved from 116-121.  Continue normal saline.    Cirrhosis  - He reports strict alcohol avoidance since admission last month, has not had any abdominal distension since paracentesis last month,  INR has improved, he is not encephalopathic  - Continue lactulose, continue alcohol avoidance, hold diuretics for now    COPD  - Stable, no cough or wheezing on admission   Hepatitis C  - Has appointment scheduled for 5/17 at [facility] to start treatment     DVT prophylaxis: Lovenox Code Status: Full code. Family Communication: no family at bed side. Disposition Plan:   Status is: Inpatient  Remains inpatient appropriate because:Inpatient level of care appropriate due to severity of illness   Dispo: The patient is from: Home              Anticipated d/c is to: Home              Patient currently is not medically stable to d/c.   Difficult to place patient No  Consultants:   None  Procedures:  None Antimicrobials: Anti-infectives (From admission, onward)   None      Subjective: Patient was seen and examined at bedside.  Overnight events noted.  Patient reports feeling better denies any pain, nausea vomiting or diarrhea. Objective: Vitals:   12/12/20 0031 12/12/20 0322 12/12/20 0500 12/12/20 0735  BP: 112/83 99/65  (!) 98/56  Pulse:  68 68  89  Resp: 15 18  16   Temp: 97.8 F (36.6 C) 98.6 F (37 C)  98.3 F (36.8 C)  TempSrc: Oral Axillary  Oral  SpO2: 100% 97%  98%  Weight:   57.5 kg   Height:   5' 9.6" (1.768 m)     Intake/Output Summary (Last 24 hours) at 12/12/2020 1606 Last data filed at 12/12/2020 0900 Gross per 24 hour  Intake 2292.5 ml  Output 1100 ml  Net 1192.5 ml   Filed Weights   12/12/20 0500  Weight: 57.5 kg    Examination:  General exam: Appears calm and comfortable, not in any acute distress.  Respiratory system:  Clear to auscultation. Respiratory effort normal. Cardiovascular system: S1 & S2 heard, RRR. No JVD, murmurs, rubs, gallops or clicks. No pedal edema. Gastrointestinal system: Abdomen is nondistended, soft and nontender. No organomegaly or masses felt.  Normal bowel sounds heard. Central nervous system: Alert and oriented. No focal neurological deficits. Extremities: Symmetric 5 x 5 power. No edema, no cyanosis, no clubbing. Skin: No rashes, lesions or ulcers Psychiatry: Judgement and insight appear normal. Mood & affect appropriate.     Data Reviewed: I have personally reviewed following labs and imaging studies  CBC: Recent Labs  Lab 12/11/20 1054 12/12/20 0326  WBC 7.7 7.7  NEUTROABS 4.4  --   HGB 12.9* 11.6*  HCT 35.1* 31.3*  MCV 90.2 90.2  PLT 163 144*   Basic Metabolic Panel: Recent Labs  Lab 12/11/20 1054 12/11/20 1930 12/12/20 0104 12/12/20 0326 12/12/20 0733 12/12/20 1137  NA 111* 113* 116* 121* 120* 120*  K 3.9  --   --  3.6  --   --   CL 82*  --   --  92*  --   --   CO2 21*  --   --  23  --   --   GLUCOSE 121*  --   --  93  --   --   BUN 8  --   --  6*  --   --   CREATININE 0.77  --   --  0.61  --   --   CALCIUM 8.2*  --   --  8.2*  --   --    GFR: Estimated Creatinine Clearance: 75.9 mL/min (by C-G formula based on SCr of 0.61 mg/dL). Liver Function Tests: Recent Labs  Lab 12/11/20 1054 12/12/20 0326  AST 54* 45*  ALT 33 29  ALKPHOS 64 57  BILITOT 2.2* 1.7*  PROT 9.3* 7.7  ALBUMIN 2.9* 2.4*   No results for input(s): LIPASE, AMYLASE in the last 168 hours. No results for input(s): AMMONIA in the last 168 hours. Coagulation Profile: Recent Labs  Lab 12/11/20 1054  INR 1.5*   Cardiac Enzymes: No results for input(s): CKTOTAL, CKMB, CKMBINDEX, TROPONINI in the last 168 hours. BNP (last 3 results) No results for input(s): PROBNP in the last 8760 hours. HbA1C: No results for input(s): HGBA1C in the last 72 hours. CBG: No results for  input(s): GLUCAP in the last 168 hours. Lipid Profile: No results for input(s): CHOL, HDL, LDLCALC, TRIG, CHOLHDL, LDLDIRECT in the last 72 hours. Thyroid Function Tests: No results for input(s): TSH, T4TOTAL, FREET4, T3FREE, THYROIDAB in the last 72 hours. Anemia Panel: No results for input(s): VITAMINB12, FOLATE, FERRITIN, TIBC, IRON, RETICCTPCT in the last 72 hours. Sepsis Labs: No results for input(s): PROCALCITON, LATICACIDVEN in the last 168 hours.  Recent Results (from the past 240  hour(s))  SARS CORONAVIRUS 2 (TAT 6-24 HRS) Nasopharyngeal Nasopharyngeal Swab     Status: None   Collection Time: 12/11/20 10:07 PM   Specimen: Nasopharyngeal Swab  Result Value Ref Range Status   SARS Coronavirus 2 NEGATIVE NEGATIVE Final    Comment: (NOTE) SARS-CoV-2 target nucleic acids are NOT DETECTED.  The SARS-CoV-2 RNA is generally detectable in upper and lower respiratory specimens during the acute phase of infection. Negative results do not preclude SARS-CoV-2 infection, do not rule out co-infections with other pathogens, and should not be used as the sole basis for treatment or other patient management decisions. Negative results must be combined with clinical observations, patient history, and epidemiological information. The expected result is Negative.  Fact Sheet for Patients: HairSlick.no  Fact Sheet for Healthcare Providers: quierodirigir.com  This test is not yet approved or cleared by the [place] FDA and  has been authorized for detection and/or diagnosis of SARS-CoV-2 by FDA under an Emergency Use Authorization (EUA). This EUA will remain  in effect (meaning this test can be used) for the duration of the COVID-19 declaration under Se ction 564(b)(1) of the Act, 21 U.S.C. section 360bbb-3(b)(1), unless the authorization is terminated or revoked sooner.  Performed at [facility], [address]  [place], [place] [address]     Radiology Studies: No results found.  Scheduled Meds: . enoxaparin (LOVENOX) injection  40 mg Subcutaneous Q24H  . folic acid  1 mg Oral Daily  . lactulose  10 g Oral BID  . thiamine  100 mg Oral Daily   Continuous Infusions:   LOS: 1 day    Time spent: 25 mins    Cipriano Bunker, MD [facility]   If 7PM-7AM, please contact night-coverage

## 2020-12-12 NOTE — Progress Notes (Signed)
Sodium correcting too rapidly. Plan to stop NS infusion, administer 2 mcg desmopressin and 200 cc 5% dextrose in water, continue frequent sodium levels.

## 2020-12-13 DIAGNOSIS — E871 Hypo-osmolality and hyponatremia: Secondary | ICD-10-CM | POA: Diagnosis not present

## 2020-12-13 LAB — COMPREHENSIVE METABOLIC PANEL
ALT: 28 U/L (ref 0–44)
AST: 42 U/L — ABNORMAL HIGH (ref 15–41)
Albumin: 2.2 g/dL — ABNORMAL LOW (ref 3.5–5.0)
Alkaline Phosphatase: 84 U/L (ref 38–126)
Anion gap: 5 (ref 5–15)
BUN: 7 mg/dL — ABNORMAL LOW (ref 8–23)
CO2: 22 mmol/L (ref 22–32)
Calcium: 8 mg/dL — ABNORMAL LOW (ref 8.9–10.3)
Chloride: 95 mmol/L — ABNORMAL LOW (ref 98–111)
Creatinine, Ser: 0.71 mg/dL (ref 0.61–1.24)
GFR, Estimated: >60 mL/min (ref >60)
Glucose, Bld: 104 mg/dL — ABNORMAL HIGH (ref 70–99)
Potassium: 4 mmol/L (ref 3.5–5.1)
Sodium: 122 mmol/L — ABNORMAL LOW (ref 135–145)
Total Bilirubin: 1.3 mg/dL — ABNORMAL HIGH (ref 0.3–1.2)
Total Protein: 7.1 g/dL (ref 6.5–8.1)

## 2020-12-13 LAB — SODIUM
Sodium: 121 mmol/L — ABNORMAL LOW (ref 135–145)
Sodium: 125 mmol/L — ABNORMAL LOW (ref 135–145)
Sodium: 125 mmol/L — ABNORMAL LOW (ref 135–145)
Sodium: 126 mmol/L — ABNORMAL LOW (ref 135–145)
Sodium: 127 mmol/L — ABNORMAL LOW (ref 135–145)

## 2020-12-13 LAB — CBC
HCT: 30.7 % — ABNORMAL LOW (ref 39.0–52.0)
Hemoglobin: 11.3 g/dL — ABNORMAL LOW (ref 13.0–17.0)
MCH: 33.3 pg (ref 26.0–34.0)
MCHC: 36.8 g/dL — ABNORMAL HIGH (ref 30.0–36.0)
MCV: 90.6 fL (ref 80.0–100.0)
Platelets: 145 10*3/uL — ABNORMAL LOW (ref 150–400)
RBC: 3.39 MIL/uL — ABNORMAL LOW (ref 4.22–5.81)
RDW: 13.9 % (ref 11.5–15.5)
WBC: 8.2 10*3/uL (ref 4.0–10.5)
nRBC: 0 % (ref 0.0–0.2)

## 2020-12-13 LAB — MAGNESIUM: Magnesium: 1.8 mg/dL (ref 1.7–2.4)

## 2020-12-13 LAB — PHOSPHORUS: Phosphorus: 3.7 mg/dL (ref 2.5–4.6)

## 2020-12-13 NOTE — Progress Notes (Signed)
PROGRESS NOTE    Michael Patton  MOL:[id number] DOB: 1956/04/01 DOA: 12/11/2020 PCP: Patient, No Pcp Per (Inactive)   Brief Narrative: This 65 y.o. 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  Patient was admitted to the hospital with decompensated liver cirrhosis last month and discharged on 11/14/2020.  Since discharge, he has not had any alcohol, has followed strict low-salt diet, and has been taking Lasix and spironolactone.  He began to feel dizzy approximately 1 week after hospital discharge.  Dizzy spells have become more frequent and severe over the past 3 weeks.  He is essentially asymptomatic when seated or lying down but develops dizziness when he tries to ambulate.  He has not had any recurrence in abdominal distention since the paracentesis last month and reports that his pedal edema has resolved since starting diuretics.  Denies any headache, change in vision or hearing, or focal numbness or weakness.  He has not had any loss of consciousness or seizures. Patient was admitted for hyponatremia, replacement is in progress.  Assessment & Plan:   Principal Problem:   Hyponatremia Active Problems:   Alcoholic cirrhosis of liver with ascites (HCC)   COPD (chronic obstructive pulmonary disease) (HCC)   Hepatitis C    Hyponatremia : > Improving. - Presents with dizziness developing over the past 3 weeks and is found to have serum sodium 111. - He appears hypovolemic, has been following low-sodium diet and taking Lasix and Aldactone since hospitalization last month .  - He was given 1 liter NS in ED , continue normal saline 800 cc/h. - Restrict free-water, hold diuretics for now, follow serial sodium levels with goal 4-6 mEq/L in 24 hours. -Serum sodium is improving it improved from 116-121> 125.  Continue normal saline.    Cirrhosis  - He  reports strict alcohol avoidance since admission last month, - He has not had any abdominal distension since paracentesis last month,  - INR has improved, he is not encephalopathic  - Continue lactulose, continue alcohol avoidance, hold diuretics for now.  COPD  - Stable, no cough or wheezing on admission   Hepatitis C  - Has appointment scheduled for 5/17 at [facility] to start treatment     DVT prophylaxis: Lovenox Code Status: Full code. Family Communication: no family at bed side. Disposition Plan:   Status is: Inpatient  Remains inpatient appropriate because:Inpatient level of care appropriate due to severity of illness   Dispo: The patient is from: Home              Anticipated d/c is to: Home on 4/9              Patient currently is not medically stable to d/c.   Difficult to place patient No  Consultants:   None  Procedures:  None Antimicrobials: Anti-infectives (From admission, onward)   None      Subjective: Patient was seen and examined at bedside.  Overnight events noted.  Patient reports feeling better,  denies any pain, nausea,  vomiting or diarrhea. He reports feeling tired and weak. Advised out of bed to chair.  Objective: Vitals:   12/13/20 0325 12/13/20 0752 12/13/20 1200 12/13/20 1609  BP: 110/70 103/67 90/64 111/77  Pulse: 74 72 77 78  Resp: 16 16 16 20   Temp: 98.1 F (36.7 C) 98.3 F (36.8 C) 97.8 F (36.6 C) 97.8 F (36.6 C)  TempSrc: Oral Oral Oral Oral  SpO2: 99% 99% 99% 99%  Weight:      Height:        Intake/Output Summary (Last 24 hours) at 12/13/2020 1617 Last data filed at 12/13/2020 0600 Gross per 24 hour  Intake 480 ml  Output 200 ml  Net 280 ml   Filed Weights   12/12/20 0500  Weight: 57.5 kg    Examination:  General exam: Appears calm and comfortable, not in any acute distress.  Respiratory system: Clear to auscultation. Respiratory effort normal. Cardiovascular system: S1 & S2 heard, RRR. No JVD, murmurs, rubs,  gallops or clicks. No pedal edema. Gastrointestinal system: Abdomen is nondistended, soft and nontender. No organomegaly or masses felt.  Normal bowel sounds heard. Central nervous system: Alert and oriented. No focal neurological deficits. Extremities: Symmetric 5 x 5 power. No edema, no cyanosis, no clubbing. Skin: No rashes, lesions or ulcers Psychiatry: Judgement and insight appear normal. Mood & affect appropriate.     Data Reviewed: I have personally reviewed following labs and imaging studies  CBC: Recent Labs  Lab 12/11/20 1054 12/12/20 0326 12/13/20 0330  WBC 7.7 7.7 8.2  NEUTROABS 4.4  --   --   HGB 12.9* 11.6* 11.3*  HCT 35.1* 31.3* 30.7*  MCV 90.2 90.2 90.6  PLT 163 144* 145*   Basic Metabolic Panel: Recent Labs  Lab 12/11/20 1054 12/11/20 1930 12/12/20 0326 12/12/20 0733 12/12/20 1516 12/12/20 1943 12/13/20 0013 12/13/20 0330 12/13/20 0733  NA 111*   < > 121*   < > 118* 118* 121* 122* 125*  K 3.9  --  3.6  --   --   --   --  4.0  --   CL 82*  --  92*  --   --   --   --  95*  --   CO2 21*  --  23  --   --   --   --  22  --   GLUCOSE 121*  --  93  --   --   --   --  104*  --   BUN 8  --  6*  --   --   --   --  7*  --   CREATININE 0.77  --  0.61  --   --   --   --  0.71  --   CALCIUM 8.2*  --  8.2*  --   --   --   --  8.0*  --   MG  --   --   --   --   --   --   --  1.8  --   PHOS  --   --   --   --   --   --   --  3.7  --    < > = values in this interval not displayed.   GFR: Estimated Creatinine Clearance: 75.9 mL/min (by C-G formula based on SCr of 0.71 mg/dL). Liver Function Tests: Recent Labs  Lab 12/11/20 1054 12/12/20 0326 12/13/20 0330  AST 54* 45* 42*  ALT 33 29 28  ALKPHOS 64 57 84  BILITOT 2.2* 1.7* 1.3*  PROT 9.3* 7.7 7.1  ALBUMIN 2.9* 2.4* 2.2*   No results for input(s): LIPASE, AMYLASE in the last 168 hours. No results for input(s): AMMONIA in the last 168 hours. Coagulation Profile: Recent Labs  Lab 12/11/20 1054  INR 1.5*    Cardiac Enzymes: No results for  input(s): CKTOTAL, CKMB, CKMBINDEX, TROPONINI in the last 168 hours. BNP (last 3 results) No results for input(s): PROBNP in the last 8760 hours. HbA1C: No results for input(s): HGBA1C in the last 72 hours. CBG: No results for input(s): GLUCAP in the last 168 hours. Lipid Profile: No results for input(s): CHOL, HDL, LDLCALC, TRIG, CHOLHDL, LDLDIRECT in the last 72 hours. Thyroid Function Tests: No results for input(s): TSH, T4TOTAL, FREET4, T3FREE, THYROIDAB in the last 72 hours. Anemia Panel: No results for input(s): VITAMINB12, FOLATE, FERRITIN, TIBC, IRON, RETICCTPCT in the last 72 hours. Sepsis Labs: No results for input(s): PROCALCITON, LATICACIDVEN in the last 168 hours.  Recent Results (from the past 240 hour(s))  SARS CORONAVIRUS 2 (TAT 6-24 HRS) Nasopharyngeal Nasopharyngeal Swab     Status: None   Collection Time: 12/11/20 10:07 PM   Specimen: Nasopharyngeal Swab  Result Value Ref Range Status   SARS Coronavirus 2 NEGATIVE NEGATIVE Final    Comment: (NOTE) SARS-CoV-2 target nucleic acids are NOT DETECTED.  The SARS-CoV-2 RNA is generally detectable in upper and lower respiratory specimens during the acute phase of infection. Negative results do not preclude SARS-CoV-2 infection, do not rule out co-infections with other pathogens, and should not be used as the sole basis for treatment or other patient management decisions. Negative results must be combined with clinical observations, patient history, and epidemiological information. The expected result is Negative.  Fact Sheet for Patients: HairSlick.no  Fact Sheet for Healthcare Providers: quierodirigir.com  This test is not yet approved or cleared by the [place] FDA and  has been authorized for detection and/or diagnosis of SARS-CoV-2 by FDA under an Emergency Use Authorization (EUA). This EUA will remain  in effect  (meaning this test can be used) for the duration of the COVID-19 declaration under Se ction 564(b)(1) of the Act, 21 U.S.C. section 360bbb-3(b)(1), unless the authorization is terminated or revoked sooner.  Performed at [facility], [address] [place], [place] [address]     Radiology Studies: No results found.  Scheduled Meds: . enoxaparin (LOVENOX) injection  40 mg Subcutaneous Q24H  . folic acid  1 mg Oral Daily  . lactulose  10 g Oral BID  . thiamine  100 mg Oral Daily   Continuous Infusions: . sodium chloride 100 mL/hr at 12/13/20 0142     LOS: 2 days    Time spent: 25 mins    Hani Campusano, MD [facility]   If 7PM-7AM, please contact night-coverage

## 2020-12-14 ENCOUNTER — Other Ambulatory Visit: Payer: Self-pay

## 2020-12-14 DIAGNOSIS — E871 Hypo-osmolality and hyponatremia: Secondary | ICD-10-CM | POA: Diagnosis not present

## 2020-12-14 LAB — CBC
HCT: 30.9 % — ABNORMAL LOW (ref 39.0–52.0)
Hemoglobin: 11.2 g/dL — ABNORMAL LOW (ref 13.0–17.0)
MCH: 32.8 pg (ref 26.0–34.0)
MCHC: 36.2 g/dL — ABNORMAL HIGH (ref 30.0–36.0)
MCV: 90.6 fL (ref 80.0–100.0)
Platelets: 135 10*3/uL — ABNORMAL LOW (ref 150–400)
RBC: 3.41 MIL/uL — ABNORMAL LOW (ref 4.22–5.81)
RDW: 14.5 % (ref 11.5–15.5)
WBC: 8.1 10*3/uL (ref 4.0–10.5)
nRBC: 0 % (ref 0.0–0.2)

## 2020-12-14 LAB — COMPREHENSIVE METABOLIC PANEL
ALT: 27 U/L (ref 0–44)
AST: 40 U/L (ref 15–41)
Albumin: 2.1 g/dL — ABNORMAL LOW (ref 3.5–5.0)
Alkaline Phosphatase: 94 U/L (ref 38–126)
Anion gap: 5 (ref 5–15)
BUN: 8 mg/dL (ref 8–23)
CO2: 21 mmol/L — ABNORMAL LOW (ref 22–32)
Calcium: 8.1 mg/dL — ABNORMAL LOW (ref 8.9–10.3)
Chloride: 103 mmol/L (ref 98–111)
Creatinine, Ser: 0.54 mg/dL — ABNORMAL LOW (ref 0.61–1.24)
GFR, Estimated: >60 mL/min (ref >60)
Glucose, Bld: 117 mg/dL — ABNORMAL HIGH (ref 70–99)
Potassium: 4 mmol/L (ref 3.5–5.1)
Sodium: 129 mmol/L — ABNORMAL LOW (ref 135–145)
Total Bilirubin: 1.1 mg/dL (ref 0.3–1.2)
Total Protein: 6.6 g/dL (ref 6.5–8.1)

## 2020-12-14 LAB — SODIUM: Sodium: 128 mmol/L — ABNORMAL LOW (ref 135–145)

## 2020-12-14 MED ORDER — SODIUM CHLORIDE 1 G PO TABS: 1.0000 g | ORAL_TABLET | Freq: Three times a day (TID) | ORAL | 0 refills | Status: DC

## 2020-12-14 NOTE — Discharge Summary (Signed)
Physician Discharge Summary  Olegario ShearerGeorge E Atchley ZOX:[id number] DOB: 09/16/1955 DOA: 12/11/2020  PCP: Patient, No Pcp Per (Inactive)  Admit date: 12/11/2020   Discharge date: 12/14/2020  Admitted From:  Home. Disposition:  Home.  Recommendations for Outpatient Follow-up:  1. Follow up with PCP in 1-2 weeks. 2. Please obtain BMP/CBC in one week. 3. Advised to take sodium chloride tablets twice daily for three days. 4. Advised to follow [facility]. 5. Advised to hold diuretics for one day and then resume tomorrow.  Home Health: None. Equipment/Devices: None.  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 Patient was admitted to the hospital with decompensated liver cirrhosis last month and discharged on 11/14/2020. Since discharge, he has not had any alcohol, he has followed strict low-salt diet, and has been taking Lasix and spironolactone. He began to feel dizzy approximately 1 week after hospital discharge. Dizzy spells have become more frequent and severe over the past 3 weeks. He is essentially asymptomatic when seated or lying down but develops dizziness when he tries to ambulate. He has not had any recurrence in abdominal distention since the paracentesis last month and reports that his pedal edema has resolved since starting diuretics. Denies any headache, change in vision or hearing, or focal numbness or weakness. He has not had any loss of consciousness or seizures. Patient was admitted for dizziness probably secondary to hyponatremia, serum sodium on admission 111,  Patient was admitted for observation and started on normal saline @100  ml/h.  Sodium was slowly corrected Patient reports  significant improvement in dizziness with improvement in serum sodium levels.  Serum sodium improved to 129.  Patient has ambulated in the hallway with PT, denies any dizziness.  Patient feels much improved, He  has tolerated soft bland diet,  wants to be discharged.  Patient is being discharged home and will follow up with his primary care [facility].  He was managed for below problems.  Discharge Diagnoses:  Principal Problem:   Hyponatremia Active Problems:   Alcoholic cirrhosis of liver with ascites (HCC)   COPD (chronic obstructive pulmonary disease) (HCC)   Hepatitis C   Hyponatremia: > Improving. -Presents with dizziness developing over the past 3 weeks and is found to have serum sodium 111. -He appears hypovolemic, has been following low-sodium diet and taking Lasix and Aldactone since hospitalization last month. -He was given 1 liter NS in ED, continue normal saline 100 cc/h. -Restrict free-water, hold diuretics for now, follow serial sodium levels with goal 4-6 mEq/L in 24 hours. -Serum sodium is improving it improved from 116-121> 125 >129.  Continue normal saline.   Cirrhosis -He reports strict alcohol avoidance since admission last month, - He has not had any abdominal distension since paracentesis last month,  - INR has improved, he is not encephalopathic -Continue lactulose, continue alcohol avoidance, hold diuretics for now.  COPD -Stable, no cough or wheezing on admission  Hepatitis C -[facility]  Discharge Instructions  Discharge Instructions    Call MD for:  difficulty breathing, headache or visual disturbances   Complete by: As directed    Call MD for:  persistant dizziness or light-headedness   Complete by: As directed    Call MD for:  persistant nausea and vomiting   Complete by: As directed    Diet - low sodium heart healthy   Complete by: As directed     Diet Carb Modified   Complete by: As directed    Discharge instructions   Complete by: As directed    Advised to follow up PCP in one week. Advised to take sodium chloride tablets twice daily for three days. Advised to follow up Hepatologist at [facility]. Advised to hold diuretics for one days and then resume.   Increase activity slowly   Complete by: As directed      Allergies as of 12/14/2020      Reactions   Sertraline Rash      Medication List    TAKE these medications   folic acid 1 MG tablet Commonly known as: FOLVITE Take 1 tablet (1 mg total) by mouth daily.   furosemide 40 MG tablet Commonly known as: Lasix Take 1 tablet (40 mg total) by mouth daily.   lactulose 10 GM/15ML solution Commonly known as: CHRONULAC Take 15 mLs (10 g total) by mouth 2 (two) times daily. Hold for diarrhea, or more than 3 loose bowel movements per day   multivitamin with minerals Tabs tablet Take 1 tablet by mouth daily.   nicotine 21 mg/24hr patch Commonly known as: NICODERM CQ - dosed in mg/24 hours Place 1 patch (21 mg total) onto the skin daily.   sodium chloride 1 g tablet Take 1 tablet (1 g total) by mouth 3 (three) times daily.   spironolactone 100 MG tablet Commonly known as: ALDACTONE Take 1 tablet (100 mg total) by mouth daily.   thiamine 100 MG tablet Take 1 tablet (100 mg total) by mouth daily.   VISINE-A OP Apply 1 drop to eye daily as needed (for dry eyes).       Follow-up Information    [facility] Follow up.   Why: Call the office to schedule a follow up appointmet and establish a primary care doctor Contact information: [address] [place] [address] [phone]             Allergies  Allergen Reactions  . Sertraline Rash    Consultations:   None   Procedures/Studies: No results found.    Subjective: Patient was seen and examined at bedside, He feels much improved and  wants to go home.  Serum sodium has improved to 129.  Patient has ambulated in the hallway with physical therapy without any dizziness.  Discharge Exam: Vitals:   12/14/20 0314 12/14/20 0757  BP: 100/71 104/68  Pulse: 91 73  Resp: 16 20  Temp: 97.8 F (36.6 C) 98.1 F (36.7 C)  SpO2: 97% 99%   Vitals:   12/14/20 0000 12/14/20 0314 12/14/20 0500 12/14/20 0757  BP: 107/79 100/71  104/68  Pulse: 84 91  73  Resp: 15 16  20   Temp: 97.9 F (36.6 C) 97.8 F (36.6 C)  98.1 F (36.7 C)  TempSrc: Axillary Oral  Oral  SpO2: 96% 97%  99%  Weight:   61 kg   Height:        General: Pt is alert, awake, not in acute distress Cardiovascular: RRR, S1/S2 +, no rubs, no gallops Respiratory: CTA bilaterally, no wheezing, no rhonchi Abdominal: Soft, NT, ND, bowel sounds + Extremities: no edema, no cyanosis    The results of significant diagnostics from this hospitalization (including imaging, microbiology, ancillary and laboratory) are listed  below for reference.     Microbiology: Recent Results (from the past 240 hour(s))  SARS CORONAVIRUS 2 (TAT 6-24 HRS) Nasopharyngeal Nasopharyngeal Swab     Status: None   Collection Time: 12/11/20 10:07 PM   Specimen: Nasopharyngeal Swab  Result Value Ref Range Status   SARS Coronavirus 2 NEGATIVE NEGATIVE Final    Comment: (NOTE) SARS-CoV-2 target nucleic acids are NOT DETECTED.  The SARS-CoV-2 RNA is generally detectable in upper and lower respiratory specimens during the acute phase of infection. Negative results do not preclude SARS-CoV-2 infection, do not rule out co-infections with other pathogens, and should not be used as the sole basis for treatment or other patient management decisions. Negative results must be combined with clinical observations, patient history, and epidemiological information. The expected result is Negative.  Fact Sheet for Patients: HairSlick.no  Fact Sheet for Healthcare  Providers: quierodirigir.com  This test is not yet approved or cleared by the [place] FDA and  has been authorized for detection and/or diagnosis of SARS-CoV-2 by FDA under an Emergency Use Authorization (EUA). This EUA will remain  in effect (meaning this test can be used) for the duration of the COVID-19 declaration under Se ction 564(b)(1) of the Act, 21 U.S.C. section 360bbb-3(b)(1), unless the authorization is terminated or revoked sooner.  Performed at [facility], [address], [place], [place] [address]      Labs: BNP (last 3 results) No results for input(s): BNP in the last 8760 hours. Basic Metabolic Panel: Recent Labs  Lab 12/11/20 1054 12/11/20 1930 12/12/20 0326 12/12/20 0733 12/13/20 0330 12/13/20 0733 12/13/20 1553 12/13/20 1909 12/13/20 2308 12/14/20 0303 12/14/20 0717  NA 111*   < > 121*   < > 122*   < > 126* 125* 127* 129* 128*  K 3.9  --  3.6  --  4.0  --   --   --   --  4.0  --   CL 82*  --  92*  --  95*  --   --   --   --  103  --   CO2 21*  --  23  --  22  --   --   --   --  21*  --   GLUCOSE 121*  --  93  --  104*  --   --   --   --  117*  --   BUN 8  --  6*  --  7*  --   --   --   --  8  --   CREATININE 0.77  --  0.61  --  0.71  --   --   --   --  0.54*  --   CALCIUM 8.2*  --  8.2*  --  8.0*  --   --   --   --  8.1*  --   MG  --   --   --   --  1.8  --   --   --   --   --   --   PHOS  --   --   --   --  3.7  --   --   --   --   --   --    < > = values in this interval not displayed.   Liver Function Tests: Recent Labs  Lab 12/11/20 1054 12/12/20 0326 12/13/20 0330 12/14/20 0303  AST 54* 45* 42* 40  ALT 33  29 28 27   ALKPHOS 64 57 84 94  BILITOT 2.2* 1.7* 1.3* 1.1  PROT 9.3* 7.7 7.1 6.6  ALBUMIN 2.9* 2.4* 2.2* 2.1*   No results for input(s): LIPASE, AMYLASE in the last 168 hours. No results for input(s): AMMONIA in the last 168 hours. CBC: Recent Labs  Lab 12/11/20 1054 12/12/20 0326  12/13/20 0330 12/14/20 0303  WBC 7.7 7.7 8.2 8.1  NEUTROABS 4.4  --   --   --   HGB 12.9* 11.6* 11.3* 11.2*  HCT 35.1* 31.3* 30.7* 30.9*  MCV 90.2 90.2 90.6 90.6  PLT 163 144* 145* 135*   Cardiac Enzymes: No results for input(s): CKTOTAL, CKMB, CKMBINDEX, TROPONINI in the last 168 hours. BNP: Invalid input(s): POCBNP CBG: No results for input(s): GLUCAP in the last 168 hours. D-Dimer No results for input(s): DDIMER in the last 72 hours. Hgb A1c No results for input(s): HGBA1C in the last 72 hours. Lipid Profile No results for input(s): CHOL, HDL, LDLCALC, TRIG, CHOLHDL, LDLDIRECT in the last 72 hours. Thyroid function studies No results for input(s): TSH, T4TOTAL, T3FREE, THYROIDAB in the last 72 hours.  Invalid input(s): FREET3 Anemia work up No results for input(s): VITAMINB12, FOLATE, FERRITIN, TIBC, IRON, RETICCTPCT in the last 72 hours. Urinalysis    Component Value Date/Time   COLORURINE YELLOW 10/15/2012 1610   APPEARANCEUR CLEAR 10/15/2012 1610   LABSPEC 1.034 (H) 10/15/2012 1610   PHURINE 5.5 10/15/2012 1610   GLUCOSEU 100 (A) 10/15/2012 1610   HGBUR NEGATIVE 10/15/2012 1610   BILIRUBINUR NEGATIVE 10/15/2012 1610   KETONESUR NEGATIVE 10/15/2012 1610   PROTEINUR NEGATIVE 10/15/2012 1610   UROBILINOGEN 1.0 10/15/2012 1610   NITRITE NEGATIVE 10/15/2012 1610   LEUKOCYTESUR NEGATIVE 10/15/2012 1610   Sepsis Labs Invalid input(s): PROCALCITONIN,  WBC,  LACTICIDVEN Microbiology Recent Results (from the past 240 hour(s))  SARS CORONAVIRUS 2 (TAT 6-24 HRS) Nasopharyngeal Nasopharyngeal Swab     Status: None   Collection Time: 12/11/20 10:07 PM   Specimen: Nasopharyngeal Swab  Result Value Ref Range Status   SARS Coronavirus 2 NEGATIVE NEGATIVE Final    Comment: (NOTE) SARS-CoV-2 target nucleic acids are NOT DETECTED.  The SARS-CoV-2 RNA is generally detectable in upper and lower respiratory specimens during the acute phase of infection. Negative results do  not preclude SARS-CoV-2 infection, do not rule out co-infections with other pathogens, and should not be used as the sole basis for treatment or other patient management decisions. Negative results must be combined with clinical observations, patient history, and epidemiological information. The expected result is Negative.  Fact Sheet for Patients: 02/10/21  Fact Sheet for Healthcare Providers: HairSlick.no  This test is not yet approved or cleared by the [place] FDA and  has been authorized for detection and/or diagnosis of SARS-CoV-2 by FDA under an Emergency Use Authorization (EUA). This EUA will remain  in effect (meaning this test can be used) for the duration of the COVID-19 declaration under Se ction 564(b)(1) of the Act, 21 U.S.C. section 360bbb-3(b)(1), unless the authorization is terminated or revoked sooner.  Performed at [facility], [address], [place], [place]      Time coordinating discharge: Over 30 minutes  SIGNED:   [id number], MD  [facility] 12/14/2020, 11:54 AM Pager   If 7PM-7AM, please contact night-coverage [email or web address]

## 2020-12-14 NOTE — Discharge Instructions (Signed)
Advised to follow up PCP in one week. Advised to take sodium chloride tablets twice daily for three days. Advised to follow up Hepatologist at [facility]. Advised to hold diuretics for one days and then resume.

## 2020-12-14 NOTE — Evaluation (Signed)
Physical Therapy Evaluation Patient Details Name: Michael Patton MRN: [id number] DOB: 1956-06-09 Today's Date: 12/14/2020   History of Present Illness  Michael Patton is a 65 y.o. 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  Patient was admitted to the hospital with decompensated liver cirrhosis last month and discharged on 11/14/2020.  Since discharge, he has not had any alcohol, has followed strict low-salt diet, and has been taking Lasix and spironolactone.  He began to feel dizzy approximately 1 week after hospital discharge.  Dizzy spells have become more frequent and severe over the past 3 weeks.  He is essentially asymptomatic when seated or lying down but develops dizziness when he tries to ambulate.  Clinical Impression  Patient received in bed, very pleasant, eager to walk. Reports he has been up in room and feeling better. Patient is mod independent with bed mobility and transfers, ambulated 500 feet without AD and min guard. One episode of unsteadiness when distracted, otherwise steady. Patient will be safe to return home.  No PT follow up recommended at this time.     Follow Up Recommendations No PT follow up    Equipment Recommendations  None recommended by PT    Recommendations for Other Services       Precautions / Restrictions Precautions Precautions: None Restrictions Weight Bearing Restrictions: No      Mobility  Bed Mobility Overal bed mobility: Independent                  Transfers Overall transfer level: Independent                  Ambulation/Gait Ambulation/Gait assistance: Min guard Gait Distance (Feet): 500 Feet Assistive device: None Gait Pattern/deviations: Decreased step length - right;Decreased step length - left;Step-through pattern Gait velocity: WNL   General Gait Details: Generally  steady, had one episode of unsteadiness when looked down  Stairs            Wheelchair Mobility    Modified Rankin (Stroke Patients Only)       Balance Overall balance assessment: Mild deficits observed, not formally tested                                           Pertinent Vitals/Pain Pain Assessment: No/denies pain    Home Living Family/patient expects to be discharged to:: Group home Living Arrangements: Alone               Additional Comments: 4 steps to enter    Prior Function Level of Independence: Independent               Hand Dominance        Extremity/Trunk Assessment   Upper Extremity Assessment Upper Extremity Assessment: Overall WFL for tasks assessed    Lower Extremity Assessment Lower Extremity Assessment: Overall WFL for tasks assessed    Cervical / Trunk Assessment Cervical / Trunk Assessment: Normal  Communication   Communication: No difficulties  Cognition Arousal/Alertness: Awake/alert Behavior During Therapy: WFL for tasks assessed/performed Overall Cognitive Status: Within Functional Limits for tasks assessed                                 General Comments:  very pleasant      General Comments      Exercises     Assessment/Plan    PT Assessment Patent does not need any further PT services  PT Problem List         PT Treatment Interventions      PT Goals (Current goals can be found in the Care Plan section)  Acute Rehab PT Goals Patient Stated Goal: return home soon PT Goal Formulation: With patient Time For Goal Achievement: 12/16/20 Potential to Achieve Goals: Good    Frequency     Barriers to discharge        Co-evaluation               AM-PAC PT "6 Clicks" Mobility  Outcome Measure Help needed turning from your back to your side while in a flat bed without using bedrails?: None Help needed moving from lying on your back to sitting on the side of a  flat bed without using bedrails?: None Help needed moving to and from a bed to a chair (including a wheelchair)?: None Help needed standing up from a chair using your arms (e.g., wheelchair or bedside chair)?: None Help needed to walk in hospital room?: None Help needed climbing 3-5 steps with a railing? : None 6 Click Score: 24    End of Session Equipment Utilized During Treatment: Gait belt Activity Tolerance: Patient tolerated treatment well Patient left: in chair;with call bell/phone within reach Nurse Communication: Mobility status PT Visit Diagnosis: Difficulty in walking, not elsewhere classified (R26.2)    Time: 6213-0865 PT Time Calculation (min) (ACUTE ONLY): 21 min   Charges:   PT Evaluation $PT Eval Low Complexity: 1 Low PT Treatments $Gait Training: 8-22 mins        Latanga Nedrow, PT, GCS 12/14/20,10:26 AM

## 2020-12-14 NOTE — Plan of Care (Signed)
  Problem: Health Behavior/Discharge Planning: Goal: Ability to manage health-related needs will improve Outcome: Adequate for Discharge   Problem: Clinical Measurements: Goal: Ability to maintain clinical measurements within normal limits will improve Outcome: Adequate for Discharge Goal: Diagnostic test results will improve Outcome: Adequate for Discharge   Problem: Activity: Goal: Risk for activity intolerance will decrease Outcome: Adequate for Discharge   Problem: Safety: Goal: Ability to remain free from injury will improve Outcome: Adequate for Discharge   Problem: Skin Integrity: Goal: Risk for impaired skin integrity will decrease Outcome: Adequate for Discharge   Problem: Education: Goal: Knowledge of General Education information will improve Description: Including pain rating scale, medication(s)/side effects and non-pharmacologic comfort measures Outcome: Adequate for Discharge

## 2021-05-23 ENCOUNTER — Encounter (HOSPITAL_COMMUNITY): Payer: Self-pay

## 2021-05-23 ENCOUNTER — Other Ambulatory Visit: Payer: Self-pay

## 2021-05-23 ENCOUNTER — Emergency Department (HOSPITAL_COMMUNITY): Payer: No Typology Code available for payment source

## 2021-05-23 ENCOUNTER — Emergency Department (HOSPITAL_COMMUNITY)
Admission: EM | Admit: 2021-05-23 | Discharge: 2021-05-23 | Disposition: A | Discharge status: Home / Self Care | Payer: No Typology Code available for payment source | Attending: Emergency Medicine | Admitting: Emergency Medicine

## 2021-05-23 DIAGNOSIS — R569 Unspecified convulsions: Secondary | ICD-10-CM

## 2021-05-23 DIAGNOSIS — S01419A Laceration without foreign body of unspecified cheek and temporomandibular area, initial encounter: Secondary | ICD-10-CM | POA: Diagnosis not present

## 2021-05-23 DIAGNOSIS — X58XXXA Exposure to other specified factors, initial encounter: Secondary | ICD-10-CM | POA: Diagnosis not present

## 2021-05-23 DIAGNOSIS — Y903 Blood alcohol level of 60-79 mg/100 ml: Secondary | ICD-10-CM | POA: Diagnosis not present

## 2021-05-23 DIAGNOSIS — R251 Tremor, unspecified: Secondary | ICD-10-CM | POA: Diagnosis not present

## 2021-05-23 DIAGNOSIS — F17210 Nicotine dependence, cigarettes, uncomplicated: Secondary | ICD-10-CM | POA: Diagnosis not present

## 2021-05-23 DIAGNOSIS — J449 Chronic obstructive pulmonary disease, unspecified: Secondary | ICD-10-CM | POA: Diagnosis not present

## 2021-05-23 LAB — CBC WITH DIFFERENTIAL/PLATELET
Abs Immature Granulocytes: 0.03 10*3/uL (ref 0.00–0.07)
Basophils Absolute: 0.1 10*3/uL (ref 0.0–0.1)
Basophils Relative: 1 %
Eosinophils Absolute: 0.1 10*3/uL (ref 0.0–0.5)
Eosinophils Relative: 1 %
HCT: 40.5 % (ref 39.0–52.0)
Hemoglobin: 14.5 g/dL (ref 13.0–17.0)
Immature Granulocytes: 0 %
Lymphocytes Relative: 26 %
Lymphs Abs: 1.9 10*3/uL (ref 0.7–4.0)
MCH: 33.7 pg (ref 26.0–34.0)
MCHC: 35.8 g/dL (ref 30.0–36.0)
MCV: 94.2 fL (ref 80.0–100.0)
Monocytes Absolute: 0.9 10*3/uL (ref 0.1–1.0)
Monocytes Relative: 13 %
Neutro Abs: 4.1 10*3/uL (ref 1.7–7.7)
Neutrophils Relative %: 59 %
Platelets: 76 10*3/uL — ABNORMAL LOW (ref 150–400)
RBC: 4.3 MIL/uL (ref 4.22–5.81)
RDW: 14.9 % (ref 11.5–15.5)
WBC: 7.1 10*3/uL (ref 4.0–10.5)
nRBC: 0 % (ref 0.0–0.2)

## 2021-05-23 LAB — MAGNESIUM: Magnesium: 2 mg/dL (ref 1.7–2.4)

## 2021-05-23 LAB — COMPREHENSIVE METABOLIC PANEL
ALT: 25 U/L (ref 0–44)
AST: 52 U/L — ABNORMAL HIGH (ref 15–41)
Albumin: 3.3 g/dL — ABNORMAL LOW (ref 3.5–5.0)
Alkaline Phosphatase: 127 U/L — ABNORMAL HIGH (ref 38–126)
Anion gap: 12 (ref 5–15)
BUN: 8 mg/dL (ref 8–23)
CO2: 20 mmol/L — ABNORMAL LOW (ref 22–32)
Calcium: 8.9 mg/dL (ref 8.9–10.3)
Chloride: 98 mmol/L (ref 98–111)
Creatinine, Ser: 0.7 mg/dL (ref 0.61–1.24)
GFR, Estimated: >60 mL/min (ref >60)
Glucose, Bld: 115 mg/dL — ABNORMAL HIGH (ref 70–99)
Potassium: 3.3 mmol/L — ABNORMAL LOW (ref 3.5–5.1)
Sodium: 130 mmol/L — ABNORMAL LOW (ref 135–145)
Total Bilirubin: 2.5 mg/dL — ABNORMAL HIGH (ref 0.3–1.2)
Total Protein: 7.9 g/dL (ref 6.5–8.1)

## 2021-05-23 LAB — CBG MONITORING, ED: Glucose-Capillary: 125 mg/dL — ABNORMAL HIGH (ref 70–99)

## 2021-05-23 LAB — ETHANOL: Alcohol, Ethyl (B): <10 mg/dL (ref <10)

## 2021-05-23 MED ORDER — THIAMINE HCL 100 MG/ML IJ SOLN
100.0000 mg | Freq: Once | INTRAMUSCULAR | Status: AC
  Administered 2021-05-23: 100 mg via INTRAVENOUS
  Filled 2021-05-23: qty 2

## 2021-05-23 MED ORDER — SODIUM CHLORIDE 0.9 % IV BOLUS
1000.0000 mL | Freq: Once | INTRAVENOUS | Status: AC
  Administered 2021-05-23: 1000 mL via INTRAVENOUS

## 2021-05-23 MED ORDER — LEVETIRACETAM 500 MG PO TABS: 500.0000 mg | ORAL_TABLET | Freq: Two times a day (BID) | ORAL | 2 refills | Status: DC

## 2021-05-23 MED ORDER — LORAZEPAM 2 MG/ML IJ SOLN
1.0000 mg | Freq: Once | INTRAMUSCULAR | Status: AC
  Administered 2021-05-23: 1 mg via INTRAVENOUS
  Filled 2021-05-23: qty 1

## 2021-05-23 MED ORDER — LEVETIRACETAM IN NACL 1000 MG/100ML IV SOLN
1000.0000 mg | Freq: Once | INTRAVENOUS | Status: AC
  Administered 2021-05-23: 1000 mg via INTRAVENOUS
  Filled 2021-05-23: qty 100

## 2021-05-23 NOTE — Discharge Instructions (Addendum)
Department of Motor Vehicle (DMV) of [place] regulations for seizures - It is the patient's responsibility to report the incidence of the seizure in the state of [place]. [place] has no statutory provision requiring physicians to report patients diagnosed with epilepsy or seizures to a central state agency.  The recommended DMV regulation requirement for a driver in [place] for an individual with a seizure is that they be seizure-free for 6-12 months. However, the DMV may consider the following exceptions to this general rule where: (1) a physician-directed change in medication causes a seizure and the individual immediately resumes the previous therapy which controlled seizures; (2) there is a history of nocturnal seizures or seizures which do not involve loss of consciousness, loss of control of motor function, or loss of appropriate sensation and information process; and (3) an individual has a seizure disorder preceded by an aura (warning) lasting 2-3 minutes. While the DMV may also give consideration to other unusual circumstances which may affect the general requirement that drivers be seizure-free for 6-12 months, interpretation of these circumstances and assignment of restrictions is at the discretion of the Medical Advisor. The DMV also considers compliance with medical therapy essential for safe driving. [The [place] Physician's Guide to Driver Medical Evaluation (June, 1995 ed.)] The Department learns of an individual's condition by inquiring on the application form or renewal form, a physician's report to the DMV, an accident report or from correspondence from the individual. The person may be required to submit a Medical Report Form either annually or semi-annually.  Do not drive, swim, take baths or do any other activities that would be dangerous if you had another seizure.   Contact a health care provider if: You have another seizure. You have seizures more often. Your seizure symptoms  change. You continue to have seizures with treatment. You have symptoms of an infection or illness. They might increase your risk of having a seizure. Get help right away if: You have a seizure: That lasts longer than 5 minutes. That is different than previous seizures. That leaves you unable to speak or use a part of your body. That makes it harder to breathe. After a head injury. You have: Multiple seizures in a row. Confusion or a severe headache right after a seizure. You are having seizures more often. You do not wake up immediately after a seizure. You injure yourself during a seizure.  

## 2021-05-23 NOTE — ED Notes (Signed)
Pt transported to CT ?

## 2021-05-23 NOTE — ED Notes (Signed)
Patient verbalizes understanding of discharge instructions. Prescriptions reviewed. Opportunity for questioning and answers were provided. Armband removed by staff, pt discharged from ED ambulatory. ° °

## 2021-05-23 NOTE — ED Provider Notes (Signed)
[facility] EMERGENCY DEPARTMENT Provider Note   CSN: [id number] Arrival date & time: 05/23/21  1727     History Chief Complaint  Patient presents with   Seizures    Michael Patton is a 65 y.o. male with a past medical history of of alcoholism, COPD, hepatitis C, cirrhosis, tobacco abuse brought in by EMS after witnessed tonic-clonic seizure at the [facility].  Patient states that he last thing he remembers was being at [facility] in the next thing he remembers is waking up with paramedics surrounding him.  EMS reports that the patient has a history of alcohol withdrawal seizures however patient states that this is the first seizure he is ever had.  EMS reports that he was postictal for approximately 15 minutes but is back to baseline.  The patient does state that he was drinking about 6-12 beers daily 3 weeks ago but quit 3 weeks ago.  He suffered a small laceration to the right temple area.  He denies headache, vision changes.  He has no injury to his mouth and did not head did not have any urinary or fecal incontinence.  She states that he has had a tetanus shot in the last 5 years   Seizures     Past Medical History:  Diagnosis Date   Alcoholism (HCC)    COPD (chronic obstructive pulmonary disease) (HCC)    Depression    Hepatitis C     Patient Active Problem List   Diagnosis Date Noted   COPD (chronic obstructive pulmonary disease) (HCC)    Hepatitis C    Hyponatremia 11/12/2020   Alcoholic cirrhosis of liver with ascites (HCC)    Uncontrolled pain 10/15/2012   Tobacco abuse 10/15/2012   Constipation 10/15/2012   Alcohol abuse 10/15/2012   CAP (community acquired pneumonia) 10/15/2012    Past Surgical History:  Procedure Laterality Date   IR PARACENTESIS  11/12/2020       No family history on file.  Social History   Tobacco Use   Smoking status: Every Day    Packs/day: 2.00    Types: Cigarettes   Smokeless tobacco: Never   Substance Use Topics   Alcohol use: Yes    Alcohol/week: 50.0 standard drinks    Types: 50 Cans of beer per week    Comment: Wkly    Drug use: No    Types: "Crack" cocaine, Opium, Marijuana    Comment: Daily     Home Medications Prior to Admission medications   Medication Sig Start Date End Date Taking? Authorizing Provider  folic acid (FOLVITE) 1 MG tablet Take 1 tablet (1 mg total) by mouth daily. 11/15/20   Elgergawy, Leana Roe, MD  furosemide (LASIX) 40 MG tablet Take 1 tablet (40 mg total) by mouth daily. 11/14/20 11/14/21  Elgergawy, Leana Roe, MD  lactulose (CHRONULAC) 10 GM/15ML solution Take 15 mLs (10 g total) by mouth 2 (two) times daily. Hold for diarrhea, or more than 3 loose bowel movements per day 11/14/20   Elgergawy, Leana Roe, MD  Multiple Vitamin (MULTIVITAMIN WITH MINERALS) TABS tablet Take 1 tablet by mouth daily. 11/15/20   Elgergawy, Leana Roe, MD  Naphazoline-Pheniramine (VISINE-A OP) Apply 1 drop to eye daily as needed (for dry eyes).    [provider]  nicotine (NICODERM CQ - DOSED IN MG/24 HOURS) 21 mg/24hr patch Place 1 patch (21 mg total) onto the skin daily. 11/15/20   Elgergawy, Leana Roe, MD  sodium chloride 1 g tablet  Take 1 tablet (1 g total) by mouth 3 (three) times daily. 12/14/20   Cipriano Bunker, MD  spironolactone (ALDACTONE) 100 MG tablet Take 1 tablet (100 mg total) by mouth daily. 11/15/20   Elgergawy, Leana Roe, MD  thiamine 100 MG tablet Take 1 tablet (100 mg total) by mouth daily. 11/15/20   Elgergawy, Leana Roe, MD    Allergies    Sertraline  Review of Systems   Review of Systems  Neurological:  Positive for seizures.  Ten systems reviewed and are negative for acute change, except as noted in the HPI.   Physical Exam Updated Vital Signs BP 120/82 (BP Location: Right Arm)   Pulse 92   Temp 98.4 F (36.9 C) (Oral)   Resp (!) 21   SpO2 96%   Physical Exam Vitals and nursing note reviewed.  Constitutional:      General: He is not in  acute distress.    Appearance: He is well-developed. He is not diaphoretic.  HENT:     Head: Normocephalic.  Eyes:     General: No scleral icterus.    Conjunctiva/sclera: Conjunctivae normal.  Cardiovascular:     Rate and Rhythm: Normal rate and regular rhythm.     Heart sounds: Normal heart sounds.  Pulmonary:     Effort: Pulmonary effort is normal. No respiratory distress.     Breath sounds: Normal breath sounds.  Abdominal:     Palpations: Abdomen is soft.     Tenderness: There is no abdominal tenderness.  Musculoskeletal:     Cervical back: Normal range of motion and neck supple.  Skin:    General: Skin is warm and dry.  Neurological:     Mental Status: He is alert.     GCS: GCS eye subscore is 4. GCS verbal subscore is 5. GCS motor subscore is 6.     Cranial Nerves: Cranial nerves are intact.     Motor: Tremor present.     Coordination: Coordination is intact.     Deep Tendon Reflexes: Reflexes are normal and symmetric.  Psychiatric:        Behavior: Behavior normal.    ED Results / Procedures / Treatments   Labs (all labs ordered are listed, but only abnormal results are displayed) Labs Reviewed  COMPREHENSIVE METABOLIC PANEL  CBC WITH DIFFERENTIAL/PLATELET  MAGNESIUM  URINALYSIS, ROUTINE W REFLEX MICROSCOPIC  ETHANOL  RAPID URINE DRUG SCREEN, HOSP PERFORMED  CBG MONITORING, ED    EKG None  Radiology No results found.  Procedures Procedures   Medications Ordered in ED Medications  LORazepam (ATIVAN) injection 1 mg (has no administration in time range)  thiamine (B-1) injection 100 mg (has no administration in time range)    ED Course  I have reviewed the triage vital signs and the nursing notes.  Pertinent labs & imaging results that were available during my care of the patient were reviewed by me and considered in my medical decision making (see chart for details).    MDM Rules/Calculators/A&P                            Patient here with  new onset seizure. The differential diagnosis for includes but is not limited to idiopathic seizure, traumatic brain injury, intracranial hemorrhage, vascular lesion, mass or space containing lesion, degenerative neurologic disease, congenital brain abnormality, infectious etiology such as meningitis, encephalitis or abscess, metabolic disturbance including hyper or hypoglycemia, hyper or hyponatremia, hyperosmolar state, uremia, hepatic  failure, hypocalcemia, hypomagnesemia.  Toxic substances such as cocaine, lidocaine, antidepressants, theophylline, alcohol withdrawal, drug withdrawal, eclampsia, hypertensive encephalopathy and anoxic brain injury.  Suspect that ETOH use has lowered seizure threshold. Doubt acute ETOH withdrawal seizure.   I ordered and reviewed labs. CMP- mild hypokalemia insig, AST:ALT 2:1 suggestive of ETOH abuse, elevated ALP, low albumin consistent with liver disease  CBC shows platelets of 76- likely due to marrow suppression from ETOH-   Mag and Ethanol wnl.   Imaging: I reviewed images of Head CT- No Acute abnormalities  Interventions: Patient given thiamine, ativan, and loading dose of Keppra.  MDM: Patient here with ETOH associated seizure. No signs fo acute withdrawal. Given loading dose of keppra and will be discharged with the same. He is advised to f/u with neurology, dc substance abuse , and given home and [place] driving precautions.   Patient will be discharged in stable condition at baseline mental status.    Final Clinical Impression(s) / ED Diagnoses Final diagnoses:  None    Rx / DC Orders ED Discharge Orders     None        Arthor Captain, PA-C 05/24/21 1047    Tegeler, Canary Brim, MD 05/26/21 [phone]

## 2021-05-23 NOTE — ED Triage Notes (Signed)
BIB EMS from outside a convenience store. Witnessed seizure today tonic clonic. EMS states that he has had seizures in the past that went away with his drinking. He recently started to drink again but stopped cold [place]. Small laceration noted to the right upper eye bleeding controled. Aox4 at this time.

## 2023-07-08 IMAGING — CT CT HEAD W/O CM
4 series · 15 of 47 positions shown, 17 images · non-contrast
Comparison: None.

CLINICAL DATA: Seizure.  Head trauma.

EXAM:
CT HEAD WITHOUT CONTRAST
TECHNIQUE: Contiguous axial images were obtained from the base of the skull
through the vertex without intravenous contrast.

[Series 3: head without · axial · non-contrast · 0.45mm/px · z∈[+1234,+1354]mm · 7 of 33 slices shown, 9 images]
[im 5/33  brain]
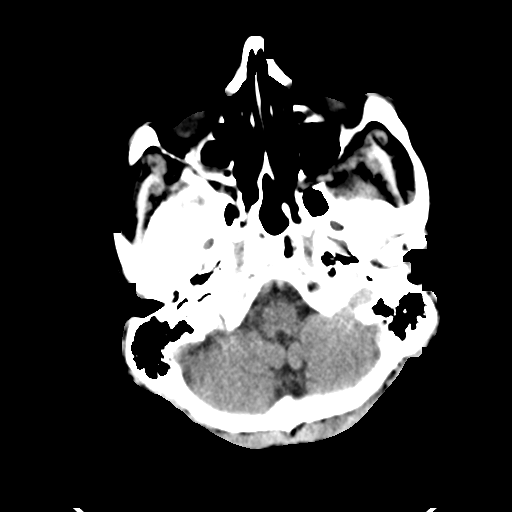
[im 5/33  bone]
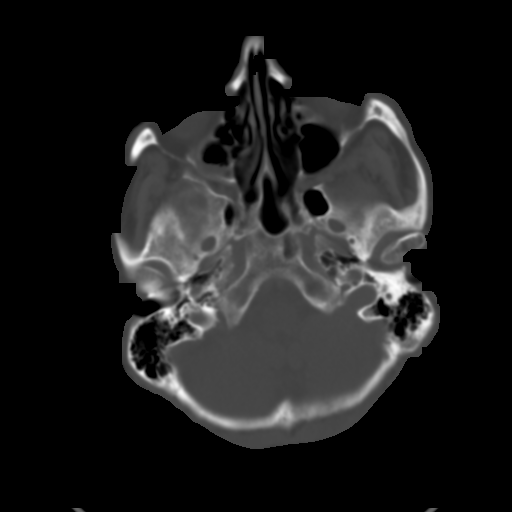
[im 9/33  brain]
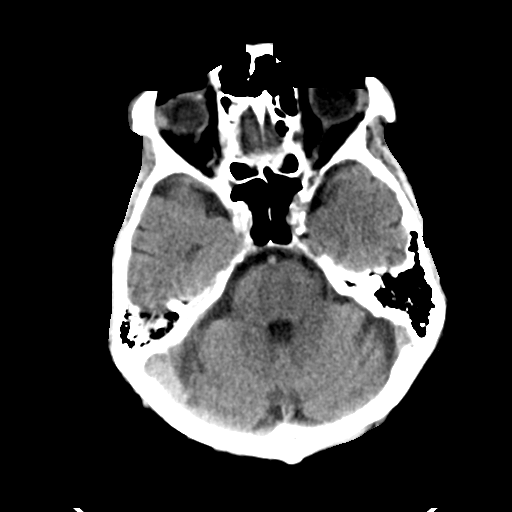
[im 13/33  brain]
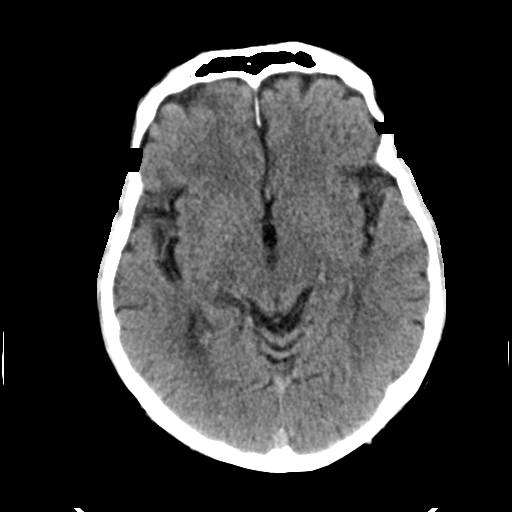
[im 17/33  brain]
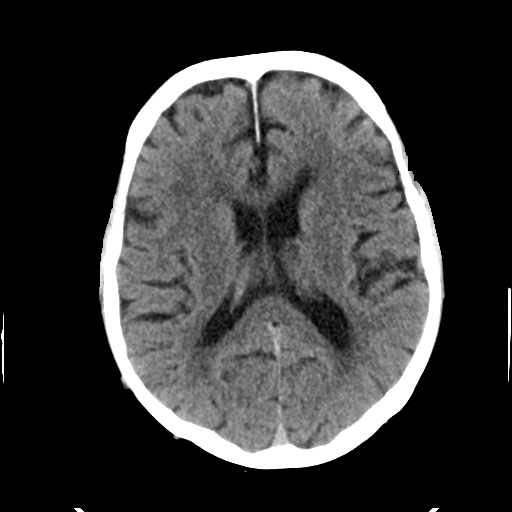
[im 21/33  brain]
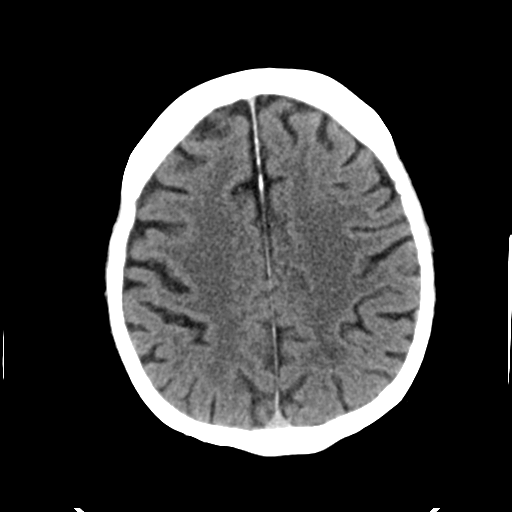
[im 21/33  bone]
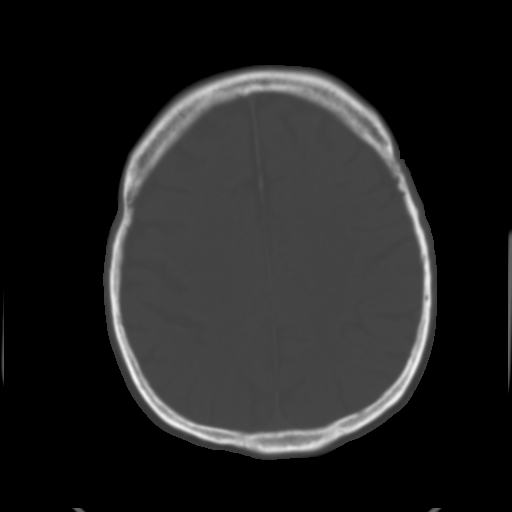
[im 25/33  brain]
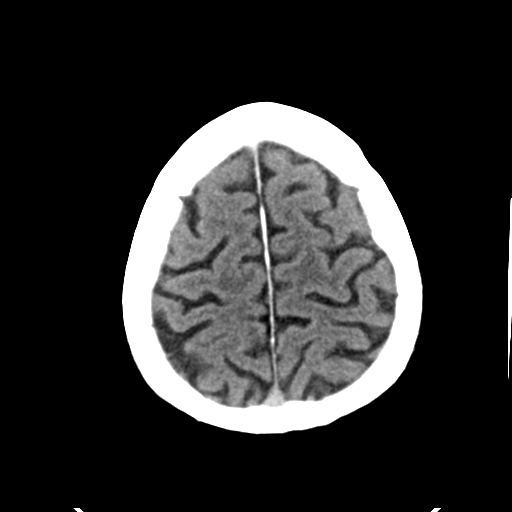
[im 29/33  brain]
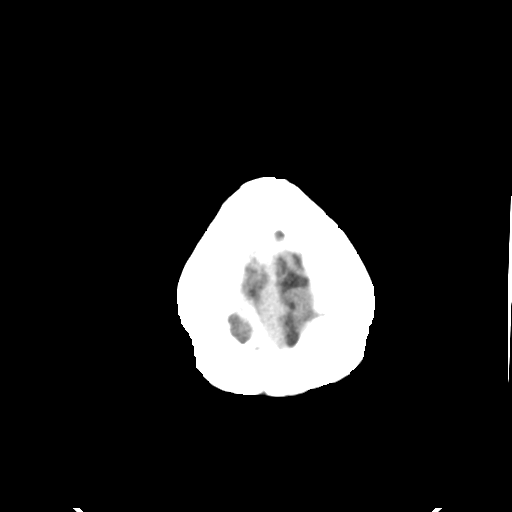

[Series 4: head bone · axial · 0.45mm/px · z∈[+1230,+1246]mm · 2 of 81 slices shown]
[im 9/81  bone]
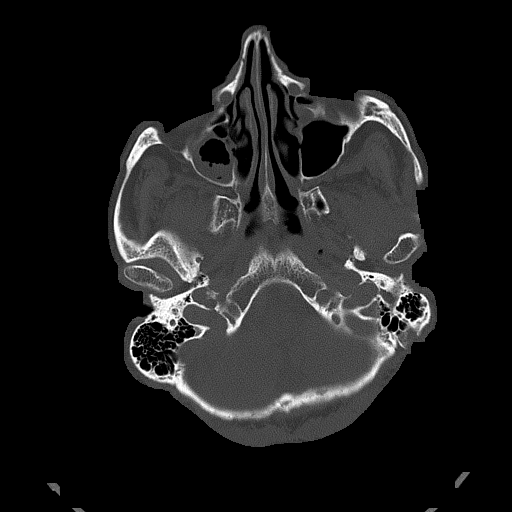
[im 17/81  bone]
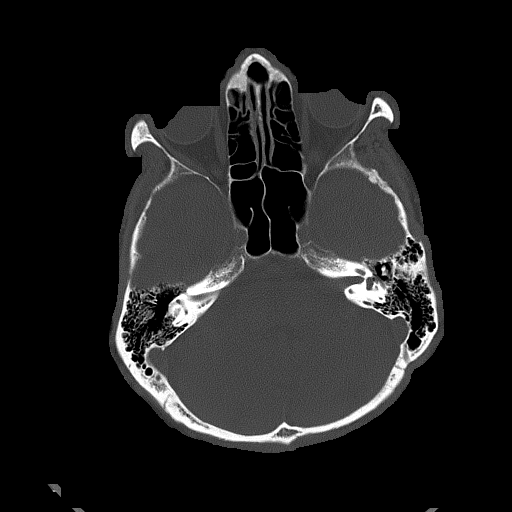

[Series 5: head without cor · coronal · non-contrast · 0.29mm/px · 3 of 69 slices shown]
[im 23/69  brain]
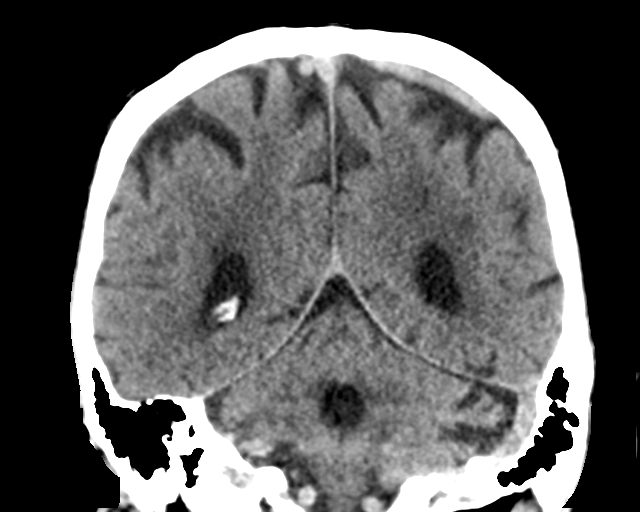
[im 31/69  brain]
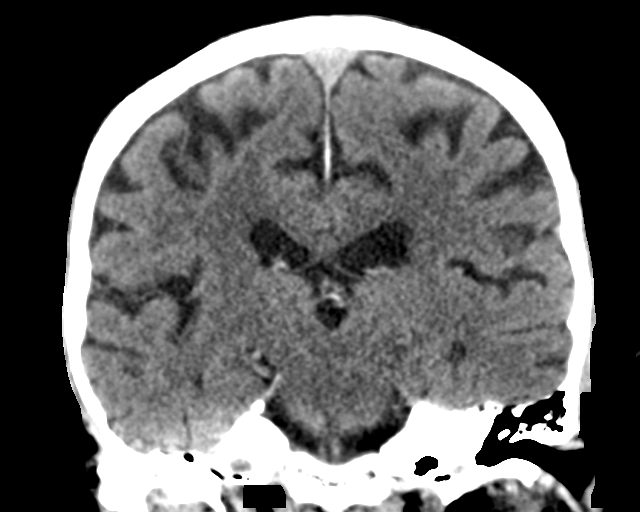
[im 38/69  brain]
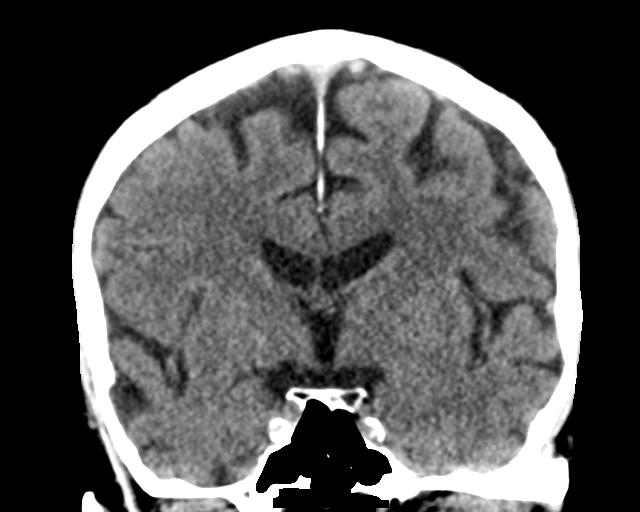

[Series 6: head without sag · sagittal · non-contrast · 0.31mm/px · 3 of 67 slices shown]
[im 23/67  brain]
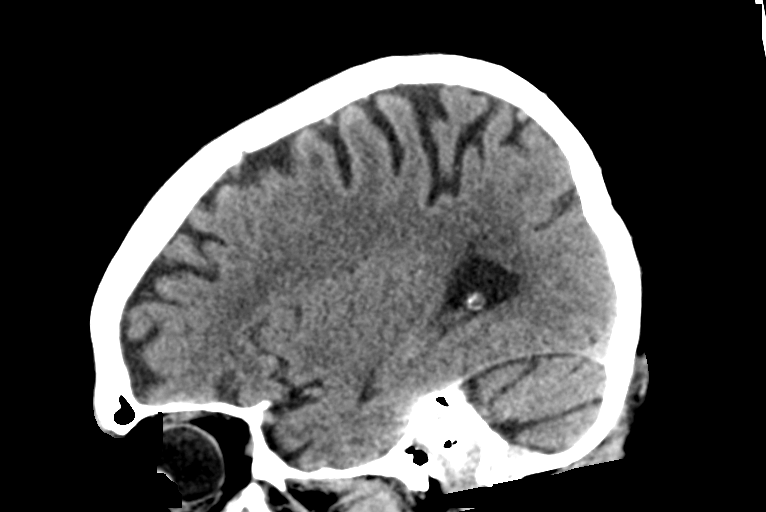
[im 34/67  brain]
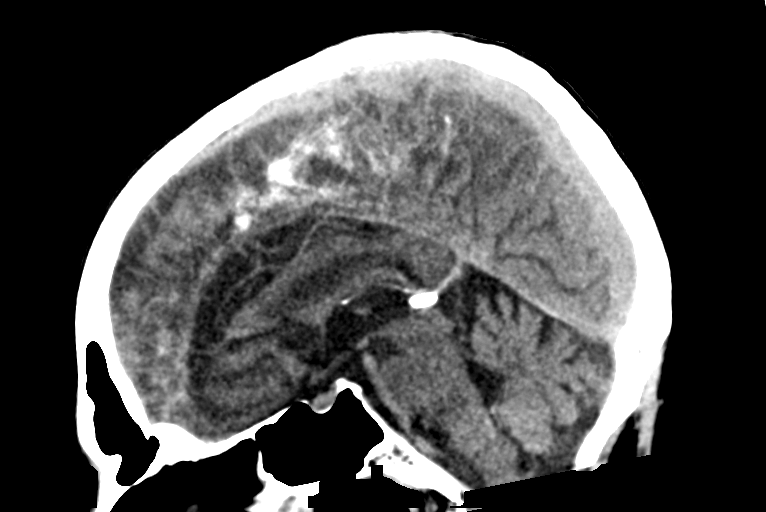
[im 45/67  brain]
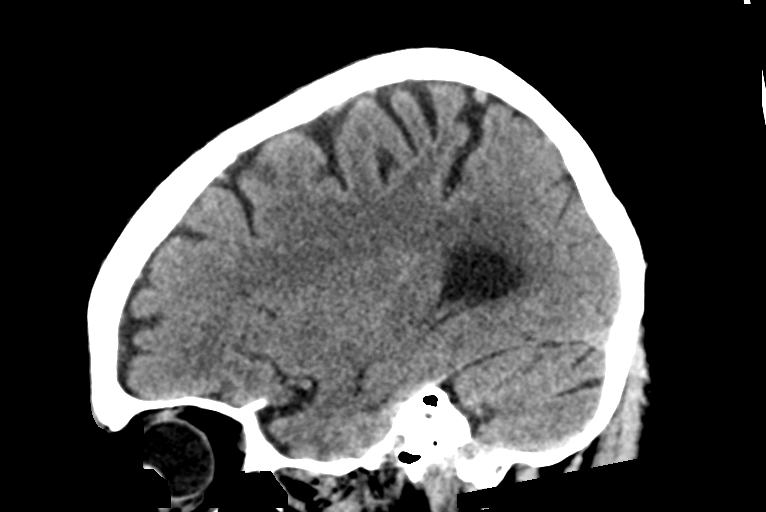

[15 of 47 positions shown; findings below may reference images not displayed]

FINDINGS: Brain: No evidence of acute infarction, hemorrhage, hydrocephalus,
extra-axial collection or mass lesion/mass effect. There is mild
periventricular white matter hypodensity, likely chronic small
vessel ischemic change.

Vascular: No hyperdense vessel or unexpected calcification.

Skull: Normal. Negative for fracture or focal lesion.

Sinuses/Orbits: There is mucosal thickening and air-fluid level in
the right maxillary sinus. Mastoid air cells are clear. There is a
small follow-up for root mucous retention cyst in the right sphenoid
sinus.

Other: None.
IMPRESSION: 1.  No acute intracranial abnormality.

2.  Mild chronic small vessel ischemic change.

3.  Acute right maxillary sinusitis.

## 2024-08-30 ENCOUNTER — Emergency Department (HOSPITAL_COMMUNITY)

## 2024-08-30 ENCOUNTER — Inpatient Hospital Stay (HOSPITAL_COMMUNITY)
Admission: EM | Admit: 2024-08-30 | Discharge: 2024-09-08 | DRG: 522 | Disposition: A | Discharge status: Skilled Nursing Facility | Attending: Infectious Diseases | Admitting: Infectious Diseases

## 2024-08-30 ENCOUNTER — Encounter (HOSPITAL_COMMUNITY): Payer: Self-pay

## 2024-08-30 ENCOUNTER — Inpatient Hospital Stay (HOSPITAL_COMMUNITY)

## 2024-08-30 ENCOUNTER — Other Ambulatory Visit: Payer: Self-pay

## 2024-08-30 DIAGNOSIS — F32A Depression, unspecified: Secondary | ICD-10-CM | POA: Diagnosis not present

## 2024-08-30 DIAGNOSIS — F17210 Nicotine dependence, cigarettes, uncomplicated: Secondary | ICD-10-CM | POA: Diagnosis present

## 2024-08-30 DIAGNOSIS — J449 Chronic obstructive pulmonary disease, unspecified: Secondary | ICD-10-CM | POA: Diagnosis present

## 2024-08-30 DIAGNOSIS — W1839XA Other fall on same level, initial encounter: Secondary | ICD-10-CM | POA: Diagnosis not present

## 2024-08-30 DIAGNOSIS — E559 Vitamin D deficiency, unspecified: Secondary | ICD-10-CM | POA: Diagnosis present

## 2024-08-30 DIAGNOSIS — M80052A Age-related osteoporosis with current pathological fracture, left femur, initial encounter for fracture: Secondary | ICD-10-CM | POA: Diagnosis present

## 2024-08-30 DIAGNOSIS — M25552 Pain in left hip: Secondary | ICD-10-CM | POA: Diagnosis present

## 2024-08-30 DIAGNOSIS — E871 Hypo-osmolality and hyponatremia: Secondary | ICD-10-CM | POA: Diagnosis present

## 2024-08-30 DIAGNOSIS — S7292XA Unspecified fracture of left femur, initial encounter for closed fracture: Secondary | ICD-10-CM | POA: Diagnosis not present

## 2024-08-30 DIAGNOSIS — S51012A Laceration without foreign body of left elbow, initial encounter: Secondary | ICD-10-CM | POA: Diagnosis present

## 2024-08-30 DIAGNOSIS — E872 Acidosis, unspecified: Secondary | ICD-10-CM | POA: Diagnosis present

## 2024-08-30 DIAGNOSIS — K7031 Alcoholic cirrhosis of liver with ascites: Secondary | ICD-10-CM | POA: Diagnosis present

## 2024-08-30 DIAGNOSIS — G9340 Encephalopathy, unspecified: Secondary | ICD-10-CM | POA: Diagnosis not present

## 2024-08-30 DIAGNOSIS — F1029 Alcohol dependence with unspecified alcohol-induced disorder: Secondary | ICD-10-CM | POA: Diagnosis present

## 2024-08-30 DIAGNOSIS — Z720 Tobacco use: Secondary | ICD-10-CM | POA: Diagnosis present

## 2024-08-30 DIAGNOSIS — F05 Delirium due to known physiological condition: Secondary | ICD-10-CM | POA: Diagnosis not present

## 2024-08-30 DIAGNOSIS — F10220 Alcohol dependence with intoxication, uncomplicated: Secondary | ICD-10-CM | POA: Diagnosis present

## 2024-08-30 DIAGNOSIS — Y92230 Patient room in hospital as the place of occurrence of the external cause: Secondary | ICD-10-CM | POA: Diagnosis not present

## 2024-08-30 DIAGNOSIS — S7292XD Unspecified fracture of left femur, subsequent encounter for closed fracture with routine healing: Secondary | ICD-10-CM

## 2024-08-30 DIAGNOSIS — W108XXA Fall (on) (from) other stairs and steps, initial encounter: Secondary | ICD-10-CM | POA: Diagnosis present

## 2024-08-30 DIAGNOSIS — B182 Chronic viral hepatitis C: Secondary | ICD-10-CM | POA: Diagnosis present

## 2024-08-30 DIAGNOSIS — Y908 Blood alcohol level of 240 mg/100 ml or more: Secondary | ICD-10-CM | POA: Diagnosis present

## 2024-08-30 DIAGNOSIS — F10188 Alcohol abuse with other alcohol-induced disorder: Secondary | ICD-10-CM | POA: Diagnosis not present

## 2024-08-30 DIAGNOSIS — R410 Disorientation, unspecified: Secondary | ICD-10-CM | POA: Diagnosis not present

## 2024-08-30 DIAGNOSIS — W19XXXA Unspecified fall, initial encounter: Principal | ICD-10-CM | POA: Diagnosis present

## 2024-08-30 DIAGNOSIS — K739 Chronic hepatitis, unspecified: Secondary | ICD-10-CM | POA: Diagnosis not present

## 2024-08-30 DIAGNOSIS — Z79899 Other long term (current) drug therapy: Secondary | ICD-10-CM | POA: Diagnosis not present

## 2024-08-30 DIAGNOSIS — Z9181 History of falling: Secondary | ICD-10-CM | POA: Diagnosis not present

## 2024-08-30 DIAGNOSIS — M8000XA Age-related osteoporosis with current pathological fracture, unspecified site, initial encounter for fracture: Secondary | ICD-10-CM | POA: Diagnosis not present

## 2024-08-30 DIAGNOSIS — S5001XA Contusion of right elbow, initial encounter: Secondary | ICD-10-CM | POA: Diagnosis not present

## 2024-08-30 DIAGNOSIS — F1090 Alcohol use, unspecified, uncomplicated: Secondary | ICD-10-CM | POA: Diagnosis not present

## 2024-08-30 DIAGNOSIS — Y92008 Other place in unspecified non-institutional (private) residence as the place of occurrence of the external cause: Secondary | ICD-10-CM | POA: Diagnosis not present

## 2024-08-30 DIAGNOSIS — F1020 Alcohol dependence, uncomplicated: Secondary | ICD-10-CM | POA: Diagnosis present

## 2024-08-30 DIAGNOSIS — D696 Thrombocytopenia, unspecified: Secondary | ICD-10-CM | POA: Diagnosis present

## 2024-08-30 DIAGNOSIS — F10129 Alcohol abuse with intoxication, unspecified: Secondary | ICD-10-CM | POA: Diagnosis not present

## 2024-08-30 DIAGNOSIS — S72002A Fracture of unspecified part of neck of left femur, initial encounter for closed fracture: Secondary | ICD-10-CM

## 2024-08-30 DIAGNOSIS — R443 Hallucinations, unspecified: Secondary | ICD-10-CM | POA: Diagnosis not present

## 2024-08-30 DIAGNOSIS — S0083XA Contusion of other part of head, initial encounter: Secondary | ICD-10-CM | POA: Diagnosis not present

## 2024-08-30 DIAGNOSIS — K7460 Unspecified cirrhosis of liver: Secondary | ICD-10-CM | POA: Diagnosis not present

## 2024-08-30 DIAGNOSIS — F10920 Alcohol use, unspecified with intoxication, uncomplicated: Secondary | ICD-10-CM

## 2024-08-30 HISTORY — DX: Chronic viral hepatitis C: B18.2

## 2024-08-30 LAB — HEPATIC FUNCTION PANEL
ALT: 38 U/L (ref 0–44)
AST: 94 U/L — ABNORMAL HIGH (ref 15–41)
Albumin: 3.6 g/dL (ref 3.5–5.0)
Alkaline Phosphatase: 102 U/L (ref 38–126)
Bilirubin, Direct: 0.8 mg/dL — ABNORMAL HIGH (ref 0.0–0.2)
Indirect Bilirubin: 0.8 mg/dL (ref 0.3–0.9)
Total Bilirubin: 1.5 mg/dL — ABNORMAL HIGH (ref 0.0–1.2)
Total Protein: 7.9 g/dL (ref 6.5–8.1)

## 2024-08-30 LAB — BASIC METABOLIC PANEL WITH GFR
Anion gap: 15 (ref 5–15)
BUN: 9 mg/dL (ref 8–23)
CO2: 17 mmol/L — ABNORMAL LOW (ref 22–32)
Calcium: 8.1 mg/dL — ABNORMAL LOW (ref 8.9–10.3)
Chloride: 95 mmol/L — ABNORMAL LOW (ref 98–111)
Creatinine, Ser: 0.56 mg/dL — ABNORMAL LOW (ref 0.61–1.24)
GFR, Estimated: >60 mL/min
Glucose, Bld: 91 mg/dL (ref 70–99)
Potassium: 4.1 mmol/L (ref 3.5–5.1)
Sodium: 127 mmol/L — ABNORMAL LOW (ref 135–145)

## 2024-08-30 LAB — CBC WITH DIFFERENTIAL/PLATELET
Abs Immature Granulocytes: 0.04 K/uL (ref 0.00–0.07)
Basophils Absolute: 0.1 K/uL (ref 0.0–0.1)
Basophils Relative: 1 %
Eosinophils Absolute: 0 K/uL (ref 0.0–0.5)
Eosinophils Relative: 0 %
HCT: 37.1 % — ABNORMAL LOW (ref 39.0–52.0)
Hemoglobin: 13.9 g/dL (ref 13.0–17.0)
Immature Granulocytes: 1 %
Lymphocytes Relative: 24 %
Lymphs Abs: 1.2 K/uL (ref 0.7–4.0)
MCH: 37.1 pg — ABNORMAL HIGH (ref 26.0–34.0)
MCHC: 37.5 g/dL — ABNORMAL HIGH (ref 30.0–36.0)
MCV: 98.9 fL (ref 80.0–100.0)
Monocytes Absolute: 0.5 K/uL (ref 0.1–1.0)
Monocytes Relative: 10 %
Neutro Abs: 3.2 K/uL (ref 1.7–7.7)
Neutrophils Relative %: 64 %
Platelets: 81 K/uL — ABNORMAL LOW (ref 150–400)
RBC: 3.75 MIL/uL — ABNORMAL LOW (ref 4.22–5.81)
RDW: 13.3 % (ref 11.5–15.5)
WBC: 5.1 K/uL (ref 4.0–10.5)
nRBC: 0 % (ref 0.0–0.2)

## 2024-08-30 LAB — PROTIME-INR
INR: 1.2 (ref 0.8–1.2)
Prothrombin Time: 16.3 s — ABNORMAL HIGH (ref 11.4–15.2)

## 2024-08-30 LAB — OSMOLALITY: Osmolality: 339 mosm/kg (ref 275–295)

## 2024-08-30 LAB — VITAMIN D 25 HYDROXY (VIT D DEFICIENCY, FRACTURES): Vit D, 25-Hydroxy: 9.9 ng/mL — ABNORMAL LOW (ref 30–100)

## 2024-08-30 LAB — TROPONIN T, HIGH SENSITIVITY: Troponin T High Sensitivity: <15 ng/L (ref 0–19)

## 2024-08-30 LAB — ETHANOL: Alcohol, Ethyl (B): 307 mg/dL

## 2024-08-30 MED ORDER — ONDANSETRON HCL 4 MG/2ML IJ SOLN
4.0000 mg | Freq: Once | INTRAMUSCULAR | Status: AC
  Administered 2024-08-30: 4 mg via INTRAVENOUS
  Filled 2024-08-30: qty 2

## 2024-08-30 MED ORDER — NICOTINE 21 MG/24HR TD PT24
21.0000 mg | MEDICATED_PATCH | Freq: Every day | TRANSDERMAL | Status: DC
  Administered 2024-08-30 – 2024-09-04 (×6): 21 mg via TRANSDERMAL
  Filled 2024-08-30 (×5): qty 1

## 2024-08-30 MED ORDER — ENOXAPARIN SODIUM 40 MG/0.4ML IJ SOSY: 40.0000 mg | PREFILLED_SYRINGE | INTRAMUSCULAR | Status: DC

## 2024-08-30 MED ORDER — THIAMINE MONONITRATE 100 MG PO TABS
100.0000 mg | ORAL_TABLET | Freq: Every day | ORAL | Status: DC
  Administered 2024-08-30 – 2024-09-08 (×10): 100 mg via ORAL
  Filled 2024-08-30 (×5): qty 1

## 2024-08-30 MED ORDER — LORAZEPAM 1 MG PO TABS
1.0000 mg | ORAL_TABLET | ORAL | Status: DC | PRN
  Administered 2024-08-31 (×3): 1 mg via ORAL
  Filled 2024-08-30 (×3): qty 1

## 2024-08-30 MED ORDER — ACETAMINOPHEN 500 MG PO TABS
500.0000 mg | ORAL_TABLET | Freq: Two times a day (BID) | ORAL | Status: DC
  Administered 2024-08-30: 500 mg via ORAL
  Filled 2024-08-30: qty 1

## 2024-08-30 MED ORDER — POLYETHYLENE GLYCOL 3350 17 G PO PACK: 17.0000 g | PACK | Freq: Every day | ORAL | Status: DC | PRN

## 2024-08-30 MED ORDER — HYDROMORPHONE HCL 1 MG/ML IJ SOLN
0.5000 mg | Freq: Once | INTRAMUSCULAR | Status: AC
  Administered 2024-08-30: 0.5 mg via INTRAVENOUS
  Filled 2024-08-30: qty 1

## 2024-08-30 MED ORDER — ACETAMINOPHEN 325 MG PO TABS: 650.0000 mg | ORAL_TABLET | Freq: Four times a day (QID) | ORAL | Status: DC | PRN

## 2024-08-30 MED ORDER — MORPHINE SULFATE (PF) 4 MG/ML IV SOLN
4.0000 mg | Freq: Once | INTRAVENOUS | Status: AC
  Administered 2024-08-30: 4 mg via INTRAVENOUS
  Filled 2024-08-30: qty 1

## 2024-08-30 MED ORDER — ENOXAPARIN SODIUM 40 MG/0.4ML IJ SOSY
40.0000 mg | PREFILLED_SYRINGE | INTRAMUSCULAR | Status: DC
  Filled 2024-08-30 (×2): qty 0.4

## 2024-08-30 MED ORDER — TETANUS-DIPHTH-ACELL PERTUSSIS 5-2-15.5 LF-MCG/0.5 IM SUSP
0.5000 mL | Freq: Once | INTRAMUSCULAR | Status: AC
  Administered 2024-08-30: 0.5 mL via INTRAMUSCULAR
  Filled 2024-08-30: qty 0.5

## 2024-08-30 MED ORDER — SODIUM CHLORIDE 0.9 % IV BOLUS: 500.0000 mL | Freq: Once | INTRAVENOUS | Status: DC

## 2024-08-30 MED ORDER — OXYCODONE HCL 5 MG PO TABS
5.0000 mg | ORAL_TABLET | Freq: Four times a day (QID) | ORAL | Status: DC | PRN
  Administered 2024-08-31 (×2): 5 mg via ORAL
  Filled 2024-08-30 (×2): qty 1

## 2024-08-30 MED ORDER — FOLIC ACID 1 MG PO TABS
1.0000 mg | ORAL_TABLET | Freq: Every day | ORAL | Status: DC
  Administered 2024-08-30 – 2024-09-08 (×10): 1 mg via ORAL
  Filled 2024-08-30 (×5): qty 1

## 2024-08-30 MED ORDER — LORAZEPAM 2 MG/ML IJ SOLN
1.0000 mg | INTRAMUSCULAR | Status: DC | PRN
  Administered 2024-08-31 – 2024-09-02 (×7): 2 mg via INTRAVENOUS
  Filled 2024-08-30 (×7): qty 1

## 2024-08-30 MED ORDER — HYDROMORPHONE HCL 1 MG/ML IJ SOLN
1.0000 mg | INTRAMUSCULAR | Status: DC | PRN
  Administered 2024-08-30 – 2024-08-31 (×2): 1 mg via INTRAVENOUS
  Filled 2024-08-30 (×3): qty 1

## 2024-08-30 MED ORDER — SODIUM CHLORIDE 0.9 % IV BOLUS
1000.0000 mL | Freq: Once | INTRAVENOUS | Status: AC
  Administered 2024-08-30: 1000 mL via INTRAVENOUS

## 2024-08-30 MED ORDER — ADULT MULTIVITAMIN W/MINERALS CH
1.0000 | ORAL_TABLET | Freq: Every day | ORAL | Status: DC
  Administered 2024-08-30 – 2024-09-08 (×10): 1 via ORAL
  Filled 2024-08-30 (×5): qty 1

## 2024-08-30 MED ORDER — NICOTINE 14 MG/24HR TD PT24: 14.0000 mg | MEDICATED_PATCH | Freq: Every day | TRANSDERMAL | Status: DC

## 2024-08-30 NOTE — ED Notes (Signed)
 Patient transported to X-ray

## 2024-08-30 NOTE — ED Provider Notes (Signed)
 " [facility] Provider Note   CSN: [id number] Arrival date & time: 08/30/24  1705     Patient presents with: Fall and Near Syncope   Michael Patton is a 68 y.o. male history of EtOH use, COPD, hep C here for evaluation of fall and hip pain.  States he was walking up the steps, his slipper caught the end of the stairs and he subsequently fell to the left.  Landed on his left elbow and left hip.  Developed significant pain to his left hip after the fall.  He is unsure if he hit his head.  When EMS got there they stood him up from the ground and he had a syncopal episode.  He states he has no prior history of syncope.  Was feeling otherwise well prior to the fall.  No postictal period, seizure-like activity.  States he had 7 beers today.  He denies any headache, neck pain, back pain, chest pain, shortness of breath, abdominal pain, numbness or weakness.  He has skin tears to his left elbow.  Unsure last tetanus.  No anticoagulation   HPI     Prior to Admission medications  Medication Sig Start Date End Date Taking? Authorizing Provider  acetaminophen  (TYLENOL ) 325 MG tablet Take 650 mg by mouth every 6 (six) hours as needed for headache or mild pain.    [provider]  folic acid  (FOLVITE ) 1 MG tablet Take 1 tablet (1 mg total) by mouth daily. Patient not taking: Reported on 05/23/2021 11/15/20   Elgergawy, Brayton RAMAN, MD  furosemide  (LASIX ) 40 MG tablet Take 1 tablet (40 mg total) by mouth daily. Patient not taking: Reported on 05/23/2021 11/14/20 11/14/21  Elgergawy, Brayton RAMAN, MD  lactulose  (CHRONULAC ) 10 GM/15ML solution Take 15 mLs (10 g total) by mouth 2 (two) times daily. Hold for diarrhea, or more than 3 loose bowel movements per day Patient not taking: Reported on 05/23/2021 11/14/20   Elgergawy, Brayton RAMAN, MD  levETIRAcetam  (KEPPRA ) 500 MG tablet Take 1 tablet (500 mg total) by mouth 2 (two) times daily. 05/23/21   Harris, Abigail, PA-C   Multiple Vitamin (MULTIVITAMIN WITH MINERALS) TABS tablet Take 1 tablet by mouth daily. Patient not taking: Reported on 05/23/2021 11/15/20   Elgergawy, Dawood S, MD  Naphazoline-Pheniramine (VISINE-A OP) Apply 1 drop to eye daily as needed (for dry eyes).    [provider]  nicotine  (NICODERM CQ  - DOSED IN MG/24 HOURS) 21 mg/24hr patch Place 1 patch (21 mg total) onto the skin daily. Patient not taking: Reported on 05/23/2021 11/15/20   Elgergawy, Brayton RAMAN, MD  sodium chloride  1 g tablet Take 1 tablet (1 g total) by mouth 3 (three) times daily. Patient not taking: No sig reported 12/14/20   Leotis Bogus, MD  spironolactone  (ALDACTONE ) 100 MG tablet Take 1 tablet (100 mg total) by mouth daily. Patient not taking: Reported on 05/23/2021 11/15/20   Elgergawy, Brayton RAMAN, MD  thiamine  100 MG tablet Take 1 tablet (100 mg total) by mouth daily. Patient not taking: Reported on 05/23/2021 11/15/20   Elgergawy, Brayton RAMAN, MD    Allergies: Sertraline    Review of Systems  Constitutional: Negative.   HENT: Negative.    Respiratory: Negative.    Cardiovascular: Negative.   Gastrointestinal: Negative.   Musculoskeletal:        Left hip pain, left elbow pain  Neurological:  Positive for syncope. Negative for dizziness, tremors, seizures, weakness, light-headedness, numbness and  headaches.  All other systems reviewed and are negative.   Updated Vital Signs BP 111/73 (BP Location: Right Arm)   Pulse (!) 109   Temp 98.4 F (36.9 C) (Oral)   Resp 20   Ht 5' 9 (1.753 m)   Wt 61 kg   SpO2 100%   BMI 19.86 kg/m   Physical Exam Vitals and nursing note reviewed.  Constitutional:      General: He is not in acute distress.    Appearance: He is well-developed. He is not ill-appearing, toxic-appearing or diaphoretic.  HENT:     Head: Normocephalic and atraumatic.     Nose: Nose normal.     Mouth/Throat:     Mouth: Mucous membranes are moist.  Eyes:     Pupils: Pupils are equal, round, and  reactive to light.  Neck:     Comments: C-collar placed by EMS Cardiovascular:     Rate and Rhythm: Normal rate and regular rhythm.     Pulses: Normal pulses.     Heart sounds: Normal heart sounds.  Pulmonary:     Effort: Pulmonary effort is normal. No respiratory distress.     Breath sounds: Normal breath sounds.  Abdominal:     General: Bowel sounds are normal. There is distension.     Palpations: Abdomen is soft.     Tenderness: There is no abdominal tenderness. There is no right CVA tenderness, left CVA tenderness, guarding or rebound.     Comments: Distended, soft, nontender  Musculoskeletal:        General: Normal range of motion.     Comments: No midline spinal tenderness.  Diffuse tenderness left hip, externally rotated.  Nontender left midshaft, distal femur, tib-fib, foot.  Diffuse tenderness left elbow.  Nontender left humerus, forearm, hand.  Nontender right upper and lower extremity.  Skin:    General: Skin is warm and dry.     Capillary Refill: Capillary refill takes less than 2 seconds.     Comments: Skin tear left elbow  Neurological:     General: No focal deficit present.     Mental Status: He is alert and oriented to person, place, and time.     Cranial Nerves: Cranial nerves 2-12 are intact.     Sensory: Sensation is intact.     Comments: Full ROM bilateral upper and lower extremities Intentional tremor bilateral upper hands     (all labs ordered are listed, but only abnormal results are displayed) Labs Reviewed  CBC WITH DIFFERENTIAL/PLATELET - Abnormal; Notable for the following components:      Result Value   RBC 3.75 (*)    HCT 37.1 (*)    MCH 37.1 (*)    MCHC 37.5 (*)    Platelets 81 (*)    All other components within normal limits  BASIC METABOLIC PANEL WITH GFR - Abnormal; Notable for the following components:   Sodium 127 (*)    Chloride 95 (*)    CO2 17 (*)    Creatinine, Ser 0.56 (*)    Calcium 8.1 (*)    All other components within normal  limits  ETHANOL - Abnormal; Notable for the following components:   Alcohol, Ethyl (B) 307 (*)    All other components within normal limits  HEPATIC FUNCTION PANEL  PROTIME-INR  TROPONIN T, HIGH SENSITIVITY    EKG: EKG Interpretation Date/Time:  Wednesday August 30 2024 17:31:08 EST Ventricular Rate:  99 PR Interval:  202 QRS Duration:  121 QT Interval:  382 QTC Calculation: 491 R Axis:   6  Text Interpretation: Sinus rhythm IVCD, consider atypical RBBB ST elevation, consider inferior injury No significant change since last tracing Confirmed by Patt Alm DEL [phone]) on 08/30/2024 5:39:28 PM  Radiology: CT HIP LEFT WO CONTRAST Result Date: 08/30/2024 CLINICAL DATA:  Status post trauma. EXAM: CT OF THE LEFT HIP WITHOUT CONTRAST TECHNIQUE: Multidetector CT imaging of the left hip was performed according to the standard protocol. Multiplanar CT image reconstructions were also generated. RADIATION DOSE REDUCTION: This exam was performed according to the departmental dose-optimization program which includes automated exposure control, adjustment of the mA and/or kV according to patient size and/or use of iterative reconstruction technique. COMPARISON:  None Available. FINDINGS: Bones/Joint/Cartilage Acute, mildly impacted fracture deformity is seen extending through the head and neck of the proximal left femur. There is no evidence of dislocation. Degenerative changes are seen in the form of joint space narrowing and acetabular sclerosis. Ligaments Suboptimally assessed by CT. Muscles and Tendons Limited in evaluation and otherwise unremarkable. Soft tissues The urinary bladder is markedly distended and is otherwise unremarkable. Moderate to marked severity prostate gland calcification is seen. IMPRESSION: Acute, mildly impacted fracture of the proximal left femur. Electronically Signed   By: Suzen Dials M.D.   On: 08/30/2024 18:30   CT Cervical Spine Wo Contrast Result Date:  08/30/2024 CLINICAL DATA:  Status post trauma. EXAM: CT CERVICAL SPINE WITHOUT CONTRAST TECHNIQUE: Multidetector CT imaging of the cervical spine was performed without intravenous contrast. Multiplanar CT image reconstructions were also generated. RADIATION DOSE REDUCTION: This exam was performed according to the departmental dose-optimization program which includes automated exposure control, adjustment of the mA and/or kV according to patient size and/or use of iterative reconstruction technique. COMPARISON:  July 25, 2017 FINDINGS: Alignment: There is approximately 1 mm retrolisthesis of the C5 vertebral body on C6. Skull base and vertebrae: No acute fracture. No primary bone lesion or focal pathologic process. Soft tissues and spinal canal: No prevertebral fluid or swelling. No visible canal hematoma. Disc levels: Moderate severity endplate sclerosis, mild anterior osteophyte formation and mild to moderate severity posterior bony spurring are seen at the levels of C5-C6 and C6-C7. There is marked severity intervertebral disc space narrowing at the levels of C5-C6 and C6-C7. Bilateral marked severity multilevel facet joint hypertrophy is noted. Upper chest: Mild biapical scarring and/or atelectasis is seen. Other: None. IMPRESSION: 1. No acute fracture or traumatic subluxation of the cervical spine. 2. Marked severity degenerative changes at the levels of C5-C6 and C6-C7. Electronically Signed   By: Suzen Dials M.D.   On: 08/30/2024 18:22   CT Head Wo Contrast Result Date: 08/30/2024 CLINICAL DATA:  Status post trauma. EXAM: CT HEAD WITHOUT CONTRAST TECHNIQUE: Contiguous axial images were obtained from the base of the skull through the vertex without intravenous contrast. RADIATION DOSE REDUCTION: This exam was performed according to the departmental dose-optimization program which includes automated exposure control, adjustment of the mA and/or kV according to patient size and/or use of iterative  reconstruction technique. COMPARISON:  May 23, 2021 FINDINGS: Brain: There is generalized cerebral atrophy with widening of the extra-axial spaces and ventricular dilatation. There are areas of decreased attenuation within the white matter tracts of the supratentorial brain, consistent with microvascular disease changes. Vascular: Moderate severity bilateral cavernous carotid artery calcification is noted. Skull: Normal. Negative for fracture or focal lesion. Sinuses/Orbits: No acute finding. Other: None. IMPRESSION: 1. Generalized cerebral atrophy and microvascular disease changes of the supratentorial brain. 2.  No acute intracranial abnormality. Electronically Signed   By: Suzen Dials M.D.   On: 08/30/2024 18:18   DG Hip Unilat W or Wo Pelvis 2-3 Views Left Result Date: 08/30/2024 CLINICAL DATA:  Status post fall. EXAM: DG HIP (WITH OR WITHOUT PELVIS) 2-3V LEFT COMPARISON:  None Available. FINDINGS: An acute fracture deformity is seen extending through the neck of the proximal left femur. There is no evidence of dislocation. Mild degenerative changes are present in the form of joint space narrowing and acetabular sclerosis. IMPRESSION: Acute fracture of the proximal left femur. Electronically Signed   By: Suzen Dials M.D.   On: 08/30/2024 18:05   DG Chest 2 View Result Date: 08/30/2024 CLINICAL DATA:  Status post fall. EXAM: DG CHEST 2V COMPARISON:  November 12, 2020 FINDINGS: The heart size and mediastinal contours are within normal limits. There is moderate severity calcification of the aortic arch. Both lungs are clear. The visualized skeletal structures are unremarkable. IMPRESSION: No active cardiopulmonary disease. Electronically Signed   By: Suzen Dials M.D.   On: 08/30/2024 18:04   DG Elbow Complete Left Result Date: 08/30/2024 CLINICAL DATA:  Syncopal episode with subsequent collapse. EXAM: LEFT ELBOW - COMPLETE 3+ VIEW COMPARISON:  None Available. FINDINGS: There is no  evidence of fracture, dislocation, or joint effusion. There is no evidence of arthropathy or other focal bone abnormality. Soft tissues are unremarkable. IMPRESSION: Negative. Electronically Signed   By: Suzen Dials M.D.   On: 08/30/2024 18:03     Procedures   Medications Ordered in the ED  morphine  (PF) 4 MG/ML injection 4 mg (4 mg Intravenous Given 08/30/24 1833)  ondansetron  (ZOFRAN ) injection 4 mg (4 mg Intravenous Given 08/30/24 1835)  Tdap (ADACEL ) injection 0.5 mL (0.5 mLs Intramuscular Given 08/30/24 1837)  sodium chloride  0.9 % bolus 1,000 mL (1,000 mLs Intravenous New Bag/Given 08/30/24 1838)  HYDROmorphone  (DILAUDID ) injection 0.5 mg (0.5 mg Intravenous Given 08/30/24 3535)   68 year old here for evaluation of fall and left hip pain.  States he was walking up steps and had a mechanical fall.  Landed on his left side.  He has pain to his left hip.  Does not follow with orthopedics.  When EMS arrived they went to stand him and he had a syncopal episode.  He denies any history of prior seizures.  He states he feels otherwise well aside from his left hip pain.  He has skin tears to his left elbow.  He has a nonfocal neuroexam.  Does admit to EtOH use prior to arrival.  Plan on labs, imaging, pain control, reassess  Labs and imaging personally viewed interpreted:  CBC without leukocytosis, hemoglobin 13 Metabolic panel sodium 127, baseline around 128, 130--- suspect likely due to chronic EtOH use Ethanol 307 Troponin less than 15 CT head, cervical without significant abnormality CT left hip with proximal femur fracture Elbow without acute fracture X-ray without significant findings EKG without ischemic changes  Discussed results with patient.  C-collar removed.  Will discuss with Ortho.  Will need admission.  Nonadhesive dressing placed with skin tear, left elbow.  Neurovascularly intact, does have intentional tremor of hands however patient states this is chronic.  Denies  prior history of DVT, withdrawal.  CONSULT with Ortho will see in consult. Can eat today CONSULT IMTS who will see for admission  Patient reassessed.  Discussed.  Agreeable for admission and surgery.  Like some additional medication for pain.  Ordered.  The patient appears reasonably stabilized for admission considering the  current resources, flow, and capabilities available in the ED at this time, and I doubt any other [facility] requiring further screening and/or treatment in the ED prior to admission.    Clinical Course as of 08/30/24 2025  Wed Aug 30, 2024  1858 Dr. Sharl with Ortho, will see in consult. Medicine admit [BH]    Clinical Course User Index [BH] Evann Erazo A, PA-C                                   Medical Decision Making Amount and/or Complexity of Data Reviewed Independent Historian: EMS External Data Reviewed: labs, radiology, ECG and notes. Labs: ordered. Decision-making details documented in ED Course. Radiology: ordered and independent interpretation performed. Decision-making details documented in ED Course. ECG/medicine tests: ordered and independent interpretation performed. Decision-making details documented in ED Course.  Risk OTC drugs. Prescription drug management. Parenteral controlled substances. Decision regarding hospitalization. Diagnosis or treatment significantly limited by social determinants of health.       Final diagnoses:  Fall, initial encounter  Closed fracture of left hip, initial encounter (HCC)  Hyponatremia  Alcohol dependence with unspecified alcohol-induced disorder [facility])  Alcoholic intoxication without complication    ED Discharge Orders     None          Tekoa Amon A, PA-C 08/30/24 2025    Patt Alm Macho, MD 08/30/24 2253  "

## 2024-08-30 NOTE — ED Notes (Signed)
 Report given to receiving RN.

## 2024-08-30 NOTE — Hospital Course (Addendum)
 He says he feels fine this morning but remembers his fall this morning and states that he could be better. He is Aox3. He is aware that he got a head scan this morning and the purpose was to evaluate after his fall. He states he is moving better and only reports lateral jaw pain from when he fell. His increase in hip pain from the fall is only minimal. No CP, SOB, abdominal pain. Passing gas and urinating, no BM yet today.  12/29 Reports that he feels overall pretty good and better then yesterday. He still feels like he stumbles when walking or getting out of the bet but this is improved from yesterday. He denies any pain today. He has not had a fall since yesterday morning. At home, he reports waling without assistive device without difficulty prior to hospitalization. Has been walking with PT and OT. He reports that he has not had a bowel movement yet and is supposed to get an enema later today. Did have flatus last night. He feels that he would like to continue to strengthen his lower extremities in the outpatient setting and would like to get back to his normal self. Denies abdominal pain, distension, bloating. Denies chest pain, shortness of breath.   Denies history of urinary obstruction.   Denies pain in the back of his heels, sacrum, and buttocks.  He is ammenable to SNF

## 2024-08-30 NOTE — TOC CM/SW Note (Signed)
 TOC consult received for substance abuse. Follow-up to be completed with patient as appropriate.    Merilee Batty, MSN, RN Case Management [phone]

## 2024-08-30 NOTE — ED Notes (Signed)
 Called and placed PT on monitor with CCMD

## 2024-08-30 NOTE — H&P (Incomplete)
 " Date: 08/30/2024               Patient Name:  Michael Patton MRN: [id number]  DOB: [birth year] Age / Sex: 68 y.o., male   PCP: [facility]         Medical Service: Internal Medicine Teaching Service         Attending Physician: Dr. Jone Dauphin      First Contact: Alan Maiden     Second Contact: Dr. Toma Edwards, DO         Pager Information: First Contact Pager: [phone]   Second Contact Pager: [phone]   SUBJECTIVE   Chief Complaint: Fall  History of Present Illness: RANFERI CLINGAN is a 68 y.o. 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   The fall occurred after he had a normal morning routine of watching TV and eating. He consumed 6-7 beers (12 oz each), along with Chinese chicken wings, jumbo fried shrimp, and French fries. He then went across the street to visit friends and, upon returning home, was accompanied by one of his grandsons due to concerns about his ability to return to his room safely.While ascending the steps to his home, he suddenly lost balance and fell backward onto the concrete sidewalk. He did not experience dizziness or confusion and denies any loss of consciousness or syncope. He reports that his head struck the sidewalk, but he did not sustain any significant head injury, and he fell primarily on his left side.  Mr. Mortimer notes a prior fall approximately two months ago, when he was walking up the porch steps to his home, after which he experienced a sudden sensation of lights going out and fell head-first. He was evaluated by EMS at that time but was determined to be stable, though he requested assistance to move to a recliner.  The patient reports drinking 6-7 beers daily (12 oz), which he has consistently done for the past four years. Prior to that, his drinking was more intermittent. He denies  using any other medications or substances, except for occasional Tylenol . He reports having normal bowel movements once a day after waking.  He denies headache, neck pain, back pain, chest pain, shortness of breath, abdominal pain, numbness, or weakness. He does have skin tears noted to the left elbow.   ED Course: In the ED, laboratory studies were notable for thrombocytopenia with a platelet count of 81 and hyponatremia with a sodium level of 127, as well as a bicarbonate of 17. There was no leukocytosis. Blood alcohol level was markedly elevated at 307. Imaging revealed an acute impacted fracture of the proximal left femur on CT of the hip. CT imaging of the head and cervical spine showed no acute abnormalities. Orthopedic surgery was consulted and recommended admission to the medicine service for medical optimization with possible hip replacement within the next few days. The internal medicine teaching service was consulted for admission.  Meds:  Patient reported: Intermittent Tylenol   Active Medications[1]  Past Medical History Alcohol use disorder COPD Hepatitis C Cirrhosis on imaging but no definite confirmation on biopsy    Past Surgical History IR paracentesis in March 2022   Social:  Lives With: Rooming Home Occupation: Retired Support: Self Level of Function: independent in ADLs and iADLs except for driving PCP:  [facility]  Substances: -Tobacco: Smokes a pack a day since  age 37 -Alcohol: Drinking about 6-7 ,12 ounces of beer for the past 4 years -Recreational Drug: Denies current or previous use of any other illicit drugs  Family History:  History reviewed. No pertinent family history.   Allergies: Sertraline -Rash   Review of Systems: A complete ROS was negative except as per HPI.   OBJECTIVE:   Physical Exam: Blood pressure 111/73, pulse (!) 109, temperature 98.4 F (36.9 C), temperature source Oral, resp. rate 20, height 5' 9 (1.753 m),  weight 61 kg, SpO2 100%.   On exam the patient is well-developed and in no acute distress, alert and oriented 3, with normal vital signs. Head is normocephalic and atraumatic, pupils are equal, round, and reactive to light, and mucous membranes are moist; a cervical collar is in place. Cardiovascular and pulmonary exams are normal. The abdomen is distended but soft and nontender with normal bowel sounds. Musculoskeletal exam shows no midline spinal tenderness, diffuse tenderness of the left hip with external rotation, and diffuse tenderness of the left elbow, with otherwise nontender extremities and preserved range of motion. Skin is warm and dry with a skin tear noted on the left elbow. Neurologically, there are no focal deficits, sensation is intact, and an intentional tremor is present in both upper extremities.  Labs: CBC    Component Value Date/Time   WBC 5.1 08/30/2024 1815   RBC 3.75 (L) 08/30/2024 1815   HGB 13.9 08/30/2024 1815   HCT 37.1 (L) 08/30/2024 1815   PLT 81 (L) 08/30/2024 1815   MCV 98.9 08/30/2024 1815   MCH 37.1 (H) 08/30/2024 1815   MCHC 37.5 (H) 08/30/2024 1815   RDW 13.3 08/30/2024 1815   LYMPHSABS 1.2 08/30/2024 1815   MONOABS 0.5 08/30/2024 1815   EOSABS 0.0 08/30/2024 1815   BASOSABS 0.1 08/30/2024 1815     CMP     Component Value Date/Time   NA 127 (L) 08/30/2024 1815   K 4.1 08/30/2024 1815   CL 95 (L) 08/30/2024 1815   CO2 17 (L) 08/30/2024 1815   GLUCOSE 91 08/30/2024 1815   BUN 9 08/30/2024 1815   CREATININE 0.56 (L) 08/30/2024 1815   CALCIUM 8.1 (L) 08/30/2024 1815   PROT 7.9 08/30/2024 2207   ALBUMIN  3.6 08/30/2024 2207   AST 94 (H) 08/30/2024 2207   ALT 38 08/30/2024 2207   ALKPHOS 102 08/30/2024 2207   BILITOT 1.5 (H) 08/30/2024 2207   GFRNONAA >60 08/30/2024 1815   GFRAA >90 03/15/2013 1638    Imaging:  US  Abdomen Limited RUQ (LIVER/GB) Result Date: 08/30/2024 CLINICAL DATA:  Alcohol abuse with withdrawal. EXAM: ULTRASOUND ABDOMEN  LIMITED RIGHT UPPER QUADRANT COMPARISON:  None Available. FINDINGS: Gallbladder: Physiologically distended. No gallstones. Mild circumferential wall thickening at 4 mm. Minimal pericholecystic fluid/right upper quadrant ascites. No sonographic Murphy sign noted by sonographer. Common bile duct: Diameter: 3 mm, normal. Liver: Heterogeneous and increased parenchymal echogenicity. Subtle capsular nodularity. No evidence of discrete focal lesion. Portal vein is patent on color Doppler imaging with normal direction of blood flow towards the liver. Other: Trace right upper quadrant ascites. IMPRESSION: 1. Cirrhotic hepatic morphology. No evidence of focal hepatic lesion. 2. Diffuse gallbladder wall thickening is likely related to chronic liver disease. No gallstones. 3. Trace pericholecystic fluid/right upper quadrant ascites. Electronically Signed   By: Andrea Gasman M.D.   On: 08/30/2024 21:46   CT HIP LEFT WO CONTRAST Result Date: 08/30/2024 CLINICAL DATA:  Status post trauma. EXAM: CT OF THE LEFT HIP WITHOUT CONTRAST TECHNIQUE:  Multidetector CT imaging of the left hip was performed according to the standard protocol. Multiplanar CT image reconstructions were also generated. RADIATION DOSE REDUCTION: This exam was performed according to the departmental dose-optimization program which includes automated exposure control, adjustment of the mA and/or kV according to patient size and/or use of iterative reconstruction technique. COMPARISON:  None Available. FINDINGS: Bones/Joint/Cartilage Acute, mildly impacted fracture deformity is seen extending through the head and neck of the proximal left femur. There is no evidence of dislocation. Degenerative changes are seen in the form of joint space narrowing and acetabular sclerosis. Ligaments Suboptimally assessed by CT. Muscles and Tendons Limited in evaluation and otherwise unremarkable. Soft tissues The urinary bladder is markedly distended and is otherwise  unremarkable. Moderate to marked severity prostate gland calcification is seen. IMPRESSION: Acute, mildly impacted fracture of the proximal left femur. Electronically Signed   By: Suzen Dials M.D.   On: 08/30/2024 18:30   CT Cervical Spine Wo Contrast Result Date: 08/30/2024 CLINICAL DATA:  Status post trauma. EXAM: CT CERVICAL SPINE WITHOUT CONTRAST TECHNIQUE: Multidetector CT imaging of the cervical spine was performed without intravenous contrast. Multiplanar CT image reconstructions were also generated. RADIATION DOSE REDUCTION: This exam was performed according to the departmental dose-optimization program which includes automated exposure control, adjustment of the mA and/or kV according to patient size and/or use of iterative reconstruction technique. COMPARISON:  July 25, 2017 FINDINGS: Alignment: There is approximately 1 mm retrolisthesis of the C5 vertebral body on C6. Skull base and vertebrae: No acute fracture. No primary bone lesion or focal pathologic process. Soft tissues and spinal canal: No prevertebral fluid or swelling. No visible canal hematoma. Disc levels: Moderate severity endplate sclerosis, mild anterior osteophyte formation and mild to moderate severity posterior bony spurring are seen at the levels of C5-C6 and C6-C7. There is marked severity intervertebral disc space narrowing at the levels of C5-C6 and C6-C7. Bilateral marked severity multilevel facet joint hypertrophy is noted. Upper chest: Mild biapical scarring and/or atelectasis is seen. Other: None. IMPRESSION: 1. No acute fracture or traumatic subluxation of the cervical spine. 2. Marked severity degenerative changes at the levels of C5-C6 and C6-C7. Electronically Signed   By: Suzen Dials M.D.   On: 08/30/2024 18:22   CT Head Wo Contrast Result Date: 08/30/2024 CLINICAL DATA:  Status post trauma. EXAM: CT HEAD WITHOUT CONTRAST TECHNIQUE: Contiguous axial images were obtained from the base of the skull  through the vertex without intravenous contrast. RADIATION DOSE REDUCTION: This exam was performed according to the departmental dose-optimization program which includes automated exposure control, adjustment of the mA and/or kV according to patient size and/or use of iterative reconstruction technique. COMPARISON:  May 23, 2021 FINDINGS: Brain: There is generalized cerebral atrophy with widening of the extra-axial spaces and ventricular dilatation. There are areas of decreased attenuation within the white matter tracts of the supratentorial brain, consistent with microvascular disease changes. Vascular: Moderate severity bilateral cavernous carotid artery calcification is noted. Skull: Normal. Negative for fracture or focal lesion. Sinuses/Orbits: No acute finding. Other: None. IMPRESSION: 1. Generalized cerebral atrophy and microvascular disease changes of the supratentorial brain. 2. No acute intracranial abnormality. Electronically Signed   By: Suzen Dials M.D.   On: 08/30/2024 18:18   DG Hip Unilat W or Wo Pelvis 2-3 Views Left Result Date: 08/30/2024 CLINICAL DATA:  Status post fall. EXAM: DG HIP (WITH OR WITHOUT PELVIS) 2-3V LEFT COMPARISON:  None Available. FINDINGS: An acute fracture deformity is seen extending through the neck of  the proximal left femur. There is no evidence of dislocation. Mild degenerative changes are present in the form of joint space narrowing and acetabular sclerosis. IMPRESSION: Acute fracture of the proximal left femur. Electronically Signed   By: Suzen Dials M.D.   On: 08/30/2024 18:05   DG Chest 2 View Result Date: 08/30/2024 CLINICAL DATA:  Status post fall. EXAM: DG CHEST 2V COMPARISON:  November 12, 2020 FINDINGS: The heart size and mediastinal contours are within normal limits. There is moderate severity calcification of the aortic arch. Both lungs are clear. The visualized skeletal structures are unremarkable. IMPRESSION: No active cardiopulmonary  disease. Electronically Signed   By: Suzen Dials M.D.   On: 08/30/2024 18:04   DG Elbow Complete Left Result Date: 08/30/2024 CLINICAL DATA:  Syncopal episode with subsequent collapse. EXAM: LEFT ELBOW - COMPLETE 3+ VIEW COMPARISON:  None Available. FINDINGS: There is no evidence of fracture, dislocation, or joint effusion. There is no evidence of arthropathy or other focal bone abnormality. Soft tissues are unremarkable. IMPRESSION: Negative. Electronically Signed   By: Suzen Dials M.D.   On: 08/30/2024 18:03      ASSESSMENT & PLAN:   Assessment & Plan by Problem: Principal Problem:   Fall Active Problems:   Tobacco abuse   COPD (chronic obstructive pulmonary disease) (HCC)   Mr. Wade is a 68 year old  male with chronic alcohol use, COPD , suspected cirrhosis, reported seizure history, and recurrent falls who presents after a mechanical fall resulting in an acute left proximal femoral fracture and is admitted for medical optimization while awaiting orthopedic surgery.  #Fall #Proximal femoral fracture Patient is a 68 year old male with a history of alcohol use disorder and falls presenting  after an acute impacted fracture of the proximal left femur following a mechanical fall in the setting of chronic alcohol use and recurrent falls.  Reassuringly, CT head is negative for intracranial bleed and CT cervical spine is negative.  Patient is not on anticoagulation ED provider discussed case with orthopedic surgery pending further evaluation for possible hip replacement which is most likely not helping in the next few days.  - Admit to med/tele for medical optimization - Surgery consulted; plan for repair/possible replacement in the next few days - Maintain nonweightbearing status on the left lower extremity as tolerated - Uncontrolled with scheduled Tylenol , - Will give oxycodone  for intermediate pain and Dilaudid  for rectal pain  - Lovenox  for DVT prophylaxis - Start  patient on diet and make n.p.o. 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 On admission, he was severely acutely intoxicated (blood alcohol 307) but denies withdrawal symptoms and has no prior history of detoxification. Laboratory studies show AST 94 and ALT 38, consistent with alcohol-induced liver injury. Physical exam shows mild abdominal distension without signs of significant ascites or right upper quadrant tenderness. Follow-up RUQ ultrasound demonstrates cirrhotic hepatic morphology without focal lesions, diffuse gallbladder wall thickening likely secondary to chronic liver disease, trace pericholecystic fluid, and minimal right upper quadrant ascites. Patient is at risk for alcohol withdrawal and potential progression of liver disease and will need close monitoring. MELD score of 8 and FIB-4 score of 12.8 - CIWA with Ativan   - Monitor for  development of ascites, hepatic encephalopathy, or signs of infection. - Counseled on alcohol cessation - Consider addiction medicine and hepatology consults while he is hospitalized - Start folate, thiamine   #Tobacco use disorder #COPD - History of COPD, not currently on inhalers. Chronic smoker (1 pack/day since age 84). - Continue to monitor respiratory status during hospitalization; provide supplemental oxygen as indicated. - Initiate nicotine  patch to reduce cravings during hospitalization. - Provide bedside smoking cessation counseling and education. - No acute respiratory interventions needed at this time due to absence of symptoms. - Follow-up with primary care for ongoing COPD management and  smoking cessation support.  Best practice: Diet: Normal VTE: Enoxaparin  IVF: None,None Code: Full  Disposition planning: Prior to Admission Living Arrangement: Home, living with roommate Anticipated Discharge Location: Home  Dispo: Admit patient to Inpatient with expected length of stay greater than 2 midnights.  Signed: Jahkari Maclin, MD Internal Medicine Resident  08/30/2024, 10:42 PM  On Call pager: [phone]      [1]  No outpatient medications have been marked as taking for the 08/30/24 encounter [facility] Encounter).   "

## 2024-08-30 NOTE — ED Triage Notes (Addendum)
 error

## 2024-08-30 NOTE — ED Triage Notes (Signed)
 PT was BIB [facility] from home after falling attempting to walk up some steps.PT endorses no LOC and is not on blood thinners. When EMS arrived and stood PT up, he had a syncopal episode and collapsed. PT is a+ox4. CBG:88. PT did say he had 6-7 beers today and is c/o of left hip pain of 10

## 2024-08-31 DIAGNOSIS — B182 Chronic viral hepatitis C: Secondary | ICD-10-CM

## 2024-08-31 DIAGNOSIS — F10129 Alcohol abuse with intoxication, unspecified: Secondary | ICD-10-CM

## 2024-08-31 DIAGNOSIS — S72002A Fracture of unspecified part of neck of left femur, initial encounter for closed fracture: Secondary | ICD-10-CM | POA: Diagnosis not present

## 2024-08-31 DIAGNOSIS — J449 Chronic obstructive pulmonary disease, unspecified: Secondary | ICD-10-CM | POA: Diagnosis not present

## 2024-08-31 DIAGNOSIS — F10188 Alcohol abuse with other alcohol-induced disorder: Secondary | ICD-10-CM

## 2024-08-31 DIAGNOSIS — Z720 Tobacco use: Secondary | ICD-10-CM | POA: Diagnosis not present

## 2024-08-31 DIAGNOSIS — W19XXXA Unspecified fall, initial encounter: Secondary | ICD-10-CM

## 2024-08-31 LAB — COMPREHENSIVE METABOLIC PANEL WITH GFR
ALT: 40 U/L (ref 0–44)
AST: 85 U/L — ABNORMAL HIGH (ref 15–41)
Albumin: 3.4 g/dL — ABNORMAL LOW (ref 3.5–5.0)
Alkaline Phosphatase: 92 U/L (ref 38–126)
Anion gap: 13 (ref 5–15)
BUN: 9 mg/dL (ref 8–23)
CO2: 21 mmol/L — ABNORMAL LOW (ref 22–32)
Calcium: 8 mg/dL — ABNORMAL LOW (ref 8.9–10.3)
Chloride: 98 mmol/L (ref 98–111)
Creatinine, Ser: 0.45 mg/dL — ABNORMAL LOW (ref 0.61–1.24)
GFR, Estimated: >60 mL/min
Glucose, Bld: 77 mg/dL (ref 70–99)
Potassium: 4.3 mmol/L (ref 3.5–5.1)
Sodium: 131 mmol/L — ABNORMAL LOW (ref 135–145)
Total Bilirubin: 1.7 mg/dL — ABNORMAL HIGH (ref 0.0–1.2)
Total Protein: 7.6 g/dL (ref 6.5–8.1)

## 2024-08-31 LAB — URINE DRUG SCREEN
Amphetamines: NEGATIVE
Barbiturates: NEGATIVE
Benzodiazepines: NEGATIVE
Cocaine: NEGATIVE
Fentanyl: NEGATIVE
Methadone Scn, Ur: NEGATIVE
Opiates: POSITIVE — AB
Tetrahydrocannabinol: NEGATIVE

## 2024-08-31 LAB — URINALYSIS, ROUTINE W REFLEX MICROSCOPIC
Bilirubin Urine: NEGATIVE
Glucose, UA: NEGATIVE mg/dL
Ketones, ur: 5 mg/dL — AB
Nitrite: POSITIVE — AB
Protein, ur: NEGATIVE mg/dL
Specific Gravity, Urine: 1.015 (ref 1.005–1.030)
pH: 5 (ref 5.0–8.0)

## 2024-08-31 LAB — TECHNOLOGIST SMEAR REVIEW

## 2024-08-31 LAB — SODIUM, URINE, RANDOM: Sodium, Ur: <30 mmol/L

## 2024-08-31 LAB — CBC
HCT: 37 % — ABNORMAL LOW (ref 39.0–52.0)
Hemoglobin: 13.6 g/dL (ref 13.0–17.0)
MCH: 36.7 pg — ABNORMAL HIGH (ref 26.0–34.0)
MCHC: 36.8 g/dL — ABNORMAL HIGH (ref 30.0–36.0)
MCV: 99.7 fL (ref 80.0–100.0)
Platelets: 73 K/uL — ABNORMAL LOW (ref 150–400)
RBC: 3.71 MIL/uL — ABNORMAL LOW (ref 4.22–5.81)
RDW: 13.3 % (ref 11.5–15.5)
WBC: 6.5 K/uL (ref 4.0–10.5)
nRBC: 0 % (ref 0.0–0.2)

## 2024-08-31 LAB — HIV ANTIBODY (ROUTINE TESTING W REFLEX): HIV Screen 4th Generation wRfx: NONREACTIVE

## 2024-08-31 MED ORDER — TRANEXAMIC ACID-NACL 1000-0.7 MG/100ML-% IV SOLN
1000.0000 mg | INTRAVENOUS | Status: AC
  Administered 2024-09-01: 1000 mg via INTRAVENOUS
  Filled 2024-08-31: qty 100

## 2024-08-31 MED ORDER — HYDROMORPHONE HCL 1 MG/ML IJ SOLN
1.0000 mg | Freq: Three times a day (TID) | INTRAMUSCULAR | Status: DC | PRN
  Administered 2024-08-31: 1 mg via INTRAVENOUS
  Filled 2024-08-31: qty 1

## 2024-08-31 MED ORDER — HYDROMORPHONE HCL 1 MG/ML IJ SOLN
0.5000 mg | Freq: Once | INTRAMUSCULAR | Status: AC
  Administered 2024-08-31: 0.5 mg via INTRAVENOUS
  Filled 2024-08-31: qty 0.5

## 2024-08-31 MED ORDER — HYDROMORPHONE HCL 2 MG PO TABS
2.0000 mg | ORAL_TABLET | ORAL | Status: DC | PRN
  Administered 2024-08-31: 2 mg via ORAL
  Filled 2024-08-31 (×2): qty 1

## 2024-08-31 MED ORDER — LIDOCAINE 5 % EX PTCH
1.0000 | MEDICATED_PATCH | CUTANEOUS | Status: DC
  Administered 2024-08-31 – 2024-09-01 (×2): 1 via TRANSDERMAL
  Filled 2024-08-31 (×4): qty 1

## 2024-08-31 MED ORDER — MUPIROCIN 2 % EX OINT
1.0000 | TOPICAL_OINTMENT | Freq: Two times a day (BID) | CUTANEOUS | Status: AC
  Administered 2024-09-01 – 2024-09-04 (×8): 1 via NASAL
  Filled 2024-08-31 (×3): qty 22

## 2024-08-31 MED ORDER — CEFAZOLIN SODIUM-DEXTROSE 2-4 GM/100ML-% IV SOLN
2.0000 g | INTRAVENOUS | Status: AC
  Administered 2024-09-01: 2 g via INTRAVENOUS
  Filled 2024-08-31: qty 100

## 2024-08-31 MED ORDER — POVIDONE-IODINE 10 % EX SWAB
2.0000 | Freq: Once | CUTANEOUS | Status: AC
  Administered 2024-09-01: 2 via TOPICAL

## 2024-08-31 MED ORDER — CHLORHEXIDINE GLUCONATE 4 % EX SOLN: 60.0000 mL | Freq: Once | CUTANEOUS | Status: DC

## 2024-08-31 MED ORDER — ACETAMINOPHEN 500 MG PO TABS
500.0000 mg | ORAL_TABLET | Freq: Four times a day (QID) | ORAL | Status: DC
  Administered 2024-08-31 – 2024-09-08 (×29): 500 mg via ORAL
  Filled 2024-08-31 (×14): qty 1

## 2024-08-31 MED ORDER — HEPARIN SODIUM (PORCINE) 5000 UNIT/ML IJ SOLN
5000.0000 [IU] | Freq: Three times a day (TID) | INTRAMUSCULAR | Status: DC
  Administered 2024-08-31 (×2): 5000 [IU] via SUBCUTANEOUS
  Filled 2024-08-31 (×2): qty 1

## 2024-08-31 NOTE — Plan of Care (Signed)
   Problem: Nutrition: Goal: Adequate nutrition will be maintained Outcome: Progressing   Problem: Coping: Goal: Level of anxiety will decrease Outcome: Progressing

## 2024-08-31 NOTE — ED Notes (Signed)
 Entered patient's room and noted a new wound to patient's right forearm. Patient states he thinks he hit his arm on the side of the rail, when asked if he wanted assistance moving to the center of the bed patient declines. States he's worried about the pain from moving and states he will just try to be more careful. Wound dressed. Checked previously dressed wound on left elbow, dressing was falling apart so that wound was re-dressed. Patient provided with new warm blankets, denies any other needs at this time.

## 2024-08-31 NOTE — Consult Note (Signed)
 "  ORTHOPAEDIC CONSULTATION  REQUESTING PHYSICIAN: Shawn Sick, MD  PCP:  [facility]  Chief Complaint: Ground-level fall  HPI: Michael Patton is a 68 y.o. male who complains of left hip pain following a ground-level fall yesterday evening.  He was enjoying a holiday celebration when he sustained a fall likely due to inebriation.  He was found in his bed in significant pain and EMS was called.  In the emergency department he was noted to have a displaced left-sided femoral neck fracture.  He does have a history of untreated hepatitis C, alcohol abuse, COPD and likely alcoholic cirrhosis.  Past Medical History:  Diagnosis Date   Alcoholism (HCC)    COPD (chronic obstructive pulmonary disease) (HCC)    Depression    Hepatitis C    Past Surgical History:  Procedure Laterality Date   IR PARACENTESIS  11/12/2020   Social History   Socioeconomic History   Marital status: Divorced    Spouse name: Not on file   Number of children: Not on file   Years of education: Not on file   Highest education level: Not on file  Occupational History   Not on file  Tobacco Use   Smoking status: Every Day    Current packs/day: 2.00    Types: Cigarettes   Smokeless tobacco: Never  Substance and Sexual Activity   Alcohol use: Yes    Alcohol/week: 50.0 standard drinks of alcohol    Types: 50 Cans of beer per week    Comment: Wkly    Drug use: No    Types: Crack cocaine, Opium , Marijuana    Comment: Daily    Sexual activity: Not on file  Other Topics Concern   Not on file  Social History Narrative   Not on file   Social Drivers of Health   Tobacco Use: High Risk (08/30/2024)   Patient History    Smoking Tobacco Use: Every Day    Smokeless Tobacco Use: Never    Passive Exposure: Not on file  Financial Resource Strain: Not on file  Food Insecurity: Not on file  Transportation Needs: Not on file  Physical Activity: Not on file  Stress: Not on file  Social Connections:  Not on file  Depression (EYV7-0): Not on file  Alcohol Screen: Not on file  Housing: Not on file  Utilities: Not on file  Health Literacy: Not on file   History reviewed. No pertinent family history. Allergies[1] Prior to Admission medications  Medication Sig Start Date End Date Taking? Authorizing Provider  acetaminophen  (TYLENOL ) 500 MG tablet Take 1,000 mg by mouth every 6 (six) hours as needed for headache or mild pain (pain score 1-3).   Yes [provider]  carboxymethylcellulose (REFRESH PLUS) 0.5 % SOLN Place 2 drops into both eyes daily as needed (dry eyes).   Yes [provider]   US  Abdomen Limited RUQ (LIVER/GB) Result Date: 08/30/2024 CLINICAL DATA:  Alcohol abuse with withdrawal. EXAM: ULTRASOUND ABDOMEN LIMITED RIGHT UPPER QUADRANT COMPARISON:  None Available. FINDINGS: Gallbladder: Physiologically distended. No gallstones. Mild circumferential wall thickening at 4 mm. Minimal pericholecystic fluid/right upper quadrant ascites. No sonographic Murphy sign noted by sonographer. Common bile duct: Diameter: 3 mm, normal. Liver: Heterogeneous and increased parenchymal echogenicity. Subtle capsular nodularity. No evidence of discrete focal lesion. Portal vein is patent on color Doppler imaging with normal direction of blood flow towards the liver. Other: Trace right upper quadrant ascites. IMPRESSION: 1. Cirrhotic hepatic morphology. No evidence of focal hepatic  lesion. 2. Diffuse gallbladder wall thickening is likely related to chronic liver disease. No gallstones. 3. Trace pericholecystic fluid/right upper quadrant ascites. Electronically Signed   By: Andrea Gasman M.D.   On: 08/30/2024 21:46   CT HIP LEFT WO CONTRAST Result Date: 08/30/2024 CLINICAL DATA:  Status post trauma. EXAM: CT OF THE LEFT HIP WITHOUT CONTRAST TECHNIQUE: Multidetector CT imaging of the left hip was performed according to the standard protocol. Multiplanar CT image reconstructions were also  generated. RADIATION DOSE REDUCTION: This exam was performed according to the departmental dose-optimization program which includes automated exposure control, adjustment of the mA and/or kV according to patient size and/or use of iterative reconstruction technique. COMPARISON:  None Available. FINDINGS: Bones/Joint/Cartilage Acute, mildly impacted fracture deformity is seen extending through the head and neck of the proximal left femur. There is no evidence of dislocation. Degenerative changes are seen in the form of joint space narrowing and acetabular sclerosis. Ligaments Suboptimally assessed by CT. Muscles and Tendons Limited in evaluation and otherwise unremarkable. Soft tissues The urinary bladder is markedly distended and is otherwise unremarkable. Moderate to marked severity prostate gland calcification is seen. IMPRESSION: Acute, mildly impacted fracture of the proximal left femur. Electronically Signed   By: Suzen Dials M.D.   On: 08/30/2024 18:30   CT Cervical Spine Wo Contrast Result Date: 08/30/2024 CLINICAL DATA:  Status post trauma. EXAM: CT CERVICAL SPINE WITHOUT CONTRAST TECHNIQUE: Multidetector CT imaging of the cervical spine was performed without intravenous contrast. Multiplanar CT image reconstructions were also generated. RADIATION DOSE REDUCTION: This exam was performed according to the departmental dose-optimization program which includes automated exposure control, adjustment of the mA and/or kV according to patient size and/or use of iterative reconstruction technique. COMPARISON:  July 25, 2017 FINDINGS: Alignment: There is approximately 1 mm retrolisthesis of the C5 vertebral body on C6. Skull base and vertebrae: No acute fracture. No primary bone lesion or focal pathologic process. Soft tissues and spinal canal: No prevertebral fluid or swelling. No visible canal hematoma. Disc levels: Moderate severity endplate sclerosis, mild anterior osteophyte formation and mild to  moderate severity posterior bony spurring are seen at the levels of C5-C6 and C6-C7. There is marked severity intervertebral disc space narrowing at the levels of C5-C6 and C6-C7. Bilateral marked severity multilevel facet joint hypertrophy is noted. Upper chest: Mild biapical scarring and/or atelectasis is seen. Other: None. IMPRESSION: 1. No acute fracture or traumatic subluxation of the cervical spine. 2. Marked severity degenerative changes at the levels of C5-C6 and C6-C7. Electronically Signed   By: Suzen Dials M.D.   On: 08/30/2024 18:22   CT Head Wo Contrast Result Date: 08/30/2024 CLINICAL DATA:  Status post trauma. EXAM: CT HEAD WITHOUT CONTRAST TECHNIQUE: Contiguous axial images were obtained from the base of the skull through the vertex without intravenous contrast. RADIATION DOSE REDUCTION: This exam was performed according to the departmental dose-optimization program which includes automated exposure control, adjustment of the mA and/or kV according to patient size and/or use of iterative reconstruction technique. COMPARISON:  May 23, 2021 FINDINGS: Brain: There is generalized cerebral atrophy with widening of the extra-axial spaces and ventricular dilatation. There are areas of decreased attenuation within the white matter tracts of the supratentorial brain, consistent with microvascular disease changes. Vascular: Moderate severity bilateral cavernous carotid artery calcification is noted. Skull: Normal. Negative for fracture or focal lesion. Sinuses/Orbits: No acute finding. Other: None. IMPRESSION: 1. Generalized cerebral atrophy and microvascular disease changes of the supratentorial brain. 2. No acute intracranial  abnormality. Electronically Signed   By: Suzen Dials M.D.   On: 08/30/2024 18:18   DG Hip Unilat W or Wo Pelvis 2-3 Views Left Result Date: 08/30/2024 CLINICAL DATA:  Status post fall. EXAM: DG HIP (WITH OR WITHOUT PELVIS) 2-3V LEFT COMPARISON:  None Available.  FINDINGS: An acute fracture deformity is seen extending through the neck of the proximal left femur. There is no evidence of dislocation. Mild degenerative changes are present in the form of joint space narrowing and acetabular sclerosis. IMPRESSION: Acute fracture of the proximal left femur. Electronically Signed   By: Suzen Dials M.D.   On: 08/30/2024 18:05   DG Chest 2 View Result Date: 08/30/2024 CLINICAL DATA:  Status post fall. EXAM: DG CHEST 2V COMPARISON:  November 12, 2020 FINDINGS: The heart size and mediastinal contours are within normal limits. There is moderate severity calcification of the aortic arch. Both lungs are clear. The visualized skeletal structures are unremarkable. IMPRESSION: No active cardiopulmonary disease. Electronically Signed   By: Suzen Dials M.D.   On: 08/30/2024 18:04   DG Elbow Complete Left Result Date: 08/30/2024 CLINICAL DATA:  Syncopal episode with subsequent collapse. EXAM: LEFT ELBOW - COMPLETE 3+ VIEW COMPARISON:  None Available. FINDINGS: There is no evidence of fracture, dislocation, or joint effusion. There is no evidence of arthropathy or other focal bone abnormality. Soft tissues are unremarkable. IMPRESSION: Negative. Electronically Signed   By: Suzen Dials M.D.   On: 08/30/2024 18:03    Positive ROS: All other systems have been reviewed and were otherwise negative with the exception of those mentioned in the HPI and as above.  Physical Exam: General: Alert, no acute distress Cardiovascular: No pedal edema Respiratory: No cyanosis, no use of accessory musculature GI: No organomegaly, abdomen is soft and non-tender Skin: No lesions in the area of chief complaint Neurologic: Sensation intact distally Psychiatric: Patient is competent for consent with normal mood and affect Lymphatic: No axillary or cervical lymphadenopathy  MUSCULOSKELETAL:  Left lower extremity is warm and well-perfused and neurovascularly intact.  Shortened and  externally rotated.  Assessment: Left displaced femoral neck fracture  Plan: Plan to be to proceed tomorrow with definitive care with a total hip arthroplasty.  This will be performed under the care of Dr. Franky Haddix with the orthopedic trauma service.  Please keep n.p.o. tonight midnight.    Selinda Belvie Gosling, MD Cell [phone]    08/31/2024 12:37 PM     [1]  Allergies Allergen Reactions   Sertraline Rash   "

## 2024-08-31 NOTE — Progress Notes (Signed)
 Case dw EDP.  Left NOF fracture with displacement will need THA in this young patient.  Timing of surgery not known due to holiday and need for hip surgeon to execute the surgery.  Appreciate admit to medicine.  Will up team ASAP.

## 2024-08-31 NOTE — Progress Notes (Signed)
 "  HD#1 SUBJECTIVE:  Patient Summary: Michael Patton is a 68 y.o. with a pertinent PMHx  of alcohol use disorder (AUD), recurrent falls, COPD, untreated hepatitis C, suspected alcohol-related cirrhosis, and reported seizure history, who presented after a mechanical fall resulting in an acute impacted fracture of the proximal left femur. He is admitted for medical optimization while awaiting orthopedic surgical intervention.  Overnight Events and Interim History: Patient found laying in bed in significant pain. States his pain is 8/10. He denies chest pain, shortness of breath, abdominal pain, numbness, or weakness. He has tremors throughout his extremities, but denies N&V, AVH, anxiety, headaches, and agitation. Current CIWA 3.    OBJECTIVE:  Vital Signs: Vitals:   08/31/24 0400 08/31/24 0500 08/31/24 0600 08/31/24 0828  BP: 137/82 129/87 (!) 144/89   Pulse: 86 95    Resp:  13 13   Temp:      TempSrc:      SpO2:  94% 90% 91%  Weight:      Height:       Supplemental O2: Room Air SpO2: 91 %  Filed Weights   08/30/24 1722  Weight: 61 kg     Intake/Output Summary (Last 24 hours) at 08/31/2024 0853 Last data filed at 08/30/2024 2126 Gross per 24 hour  Intake 1000.11 ml  Output --  Net 1000.11 ml   Net IO Since Admission: 1,000.11 mL [08/31/24 0853]  Physical Exam: Physical Exam Constitutional:      Appearance: Normal appearance.  HENT:     Head: Normocephalic and atraumatic.  Eyes:     Pupils: Pupils are equal, round, and reactive to light.  Cardiovascular:     Rate and Rhythm: Normal rate and regular rhythm.     Heart sounds: Normal heart sounds.  Pulmonary:     Effort: Pulmonary effort is normal.     Breath sounds: Normal breath sounds.  Abdominal:     General: Abdomen is flat.     Palpations: Abdomen is soft.  Musculoskeletal:     Cervical back: Normal range of motion.     Comments: Diffuse tenderness of the bilateral hips, did not test ROM   Feet:      Comments: Dorsalis pedis pulses 3+ bilaterally Neurological:     Mental Status: He is alert.     Patient Lines/Drains/Airways Status     Active Line/Drains/Airways     Name Placement date Placement time Site Days   Peripheral IV 08/30/24 20 G Anterior;Right;Upper Arm 08/30/24  1800  Arm  1            Pertinent Imaging: US  Abdomen Limited RUQ (LIVER/GB) Result Date: 08/30/2024 CLINICAL DATA:  Alcohol abuse with withdrawal. EXAM: ULTRASOUND ABDOMEN LIMITED RIGHT UPPER QUADRANT COMPARISON:  None Available. FINDINGS: Gallbladder: Physiologically distended. No gallstones. Mild circumferential wall thickening at 4 mm. Minimal pericholecystic fluid/right upper quadrant ascites. No sonographic Murphy sign noted by sonographer. Common bile duct: Diameter: 3 mm, normal. Liver: Heterogeneous and increased parenchymal echogenicity. Subtle capsular nodularity. No evidence of discrete focal lesion. Portal vein is patent on color Doppler imaging with normal direction of blood flow towards the liver. Other: Trace right upper quadrant ascites. IMPRESSION: 1. Cirrhotic hepatic morphology. No evidence of focal hepatic lesion. 2. Diffuse gallbladder wall thickening is likely related to chronic liver disease. No gallstones. 3. Trace pericholecystic fluid/right upper quadrant ascites. Electronically Signed   By: Andrea Gasman M.D.   On: 08/30/2024 21:46   CT HIP LEFT WO CONTRAST Result Date: 08/30/2024  CLINICAL DATA:  Status post trauma. EXAM: CT OF THE LEFT HIP WITHOUT CONTRAST TECHNIQUE: Multidetector CT imaging of the left hip was performed according to the standard protocol. Multiplanar CT image reconstructions were also generated. RADIATION DOSE REDUCTION: This exam was performed according to the departmental dose-optimization program which includes automated exposure control, adjustment of the mA and/or kV according to patient size and/or use of iterative reconstruction technique. COMPARISON:  None  Available. FINDINGS: Bones/Joint/Cartilage Acute, mildly impacted fracture deformity is seen extending through the head and neck of the proximal left femur. There is no evidence of dislocation. Degenerative changes are seen in the form of joint space narrowing and acetabular sclerosis. Ligaments Suboptimally assessed by CT. Muscles and Tendons Limited in evaluation and otherwise unremarkable. Soft tissues The urinary bladder is markedly distended and is otherwise unremarkable. Moderate to marked severity prostate gland calcification is seen. IMPRESSION: Acute, mildly impacted fracture of the proximal left femur. Electronically Signed   By: Suzen Dials M.D.   On: 08/30/2024 18:30   CT Cervical Spine Wo Contrast Result Date: 08/30/2024 CLINICAL DATA:  Status post trauma. EXAM: CT CERVICAL SPINE WITHOUT CONTRAST TECHNIQUE: Multidetector CT imaging of the cervical spine was performed without intravenous contrast. Multiplanar CT image reconstructions were also generated. RADIATION DOSE REDUCTION: This exam was performed according to the departmental dose-optimization program which includes automated exposure control, adjustment of the mA and/or kV according to patient size and/or use of iterative reconstruction technique. COMPARISON:  July 25, 2017 FINDINGS: Alignment: There is approximately 1 mm retrolisthesis of the C5 vertebral body on C6. Skull base and vertebrae: No acute fracture. No primary bone lesion or focal pathologic process. Soft tissues and spinal canal: No prevertebral fluid or swelling. No visible canal hematoma. Disc levels: Moderate severity endplate sclerosis, mild anterior osteophyte formation and mild to moderate severity posterior bony spurring are seen at the levels of C5-C6 and C6-C7. There is marked severity intervertebral disc space narrowing at the levels of C5-C6 and C6-C7. Bilateral marked severity multilevel facet joint hypertrophy is noted. Upper chest: Mild biapical scarring  and/or atelectasis is seen. Other: None. IMPRESSION: 1. No acute fracture or traumatic subluxation of the cervical spine. 2. Marked severity degenerative changes at the levels of C5-C6 and C6-C7. Electronically Signed   By: Suzen Dials M.D.   On: 08/30/2024 18:22   CT Head Wo Contrast Result Date: 08/30/2024 CLINICAL DATA:  Status post trauma. EXAM: CT HEAD WITHOUT CONTRAST TECHNIQUE: Contiguous axial images were obtained from the base of the skull through the vertex without intravenous contrast. RADIATION DOSE REDUCTION: This exam was performed according to the departmental dose-optimization program which includes automated exposure control, adjustment of the mA and/or kV according to patient size and/or use of iterative reconstruction technique. COMPARISON:  May 23, 2021 FINDINGS: Brain: There is generalized cerebral atrophy with widening of the extra-axial spaces and ventricular dilatation. There are areas of decreased attenuation within the white matter tracts of the supratentorial brain, consistent with microvascular disease changes. Vascular: Moderate severity bilateral cavernous carotid artery calcification is noted. Skull: Normal. Negative for fracture or focal lesion. Sinuses/Orbits: No acute finding. Other: None. IMPRESSION: 1. Generalized cerebral atrophy and microvascular disease changes of the supratentorial brain. 2. No acute intracranial abnormality. Electronically Signed   By: Suzen Dials M.D.   On: 08/30/2024 18:18   DG Hip Unilat W or Wo Pelvis 2-3 Views Left Result Date: 08/30/2024 CLINICAL DATA:  Status post fall. EXAM: DG HIP (WITH OR WITHOUT PELVIS) 2-3V LEFT COMPARISON:  None Available. FINDINGS: An acute fracture deformity is seen extending through the neck of the proximal left femur. There is no evidence of dislocation. Mild degenerative changes are present in the form of joint space narrowing and acetabular sclerosis. IMPRESSION: Acute fracture of the proximal left  femur. Electronically Signed   By: Suzen Dials M.D.   On: 08/30/2024 18:05   DG Chest 2 View Result Date: 08/30/2024 CLINICAL DATA:  Status post fall. EXAM: DG CHEST 2V COMPARISON:  November 12, 2020 FINDINGS: The heart size and mediastinal contours are within normal limits. There is moderate severity calcification of the aortic arch. Both lungs are clear. The visualized skeletal structures are unremarkable. IMPRESSION: No active cardiopulmonary disease. Electronically Signed   By: Suzen Dials M.D.   On: 08/30/2024 18:04   DG Elbow Complete Left Result Date: 08/30/2024 CLINICAL DATA:  Syncopal episode with subsequent collapse. EXAM: LEFT ELBOW - COMPLETE 3+ VIEW COMPARISON:  None Available. FINDINGS: There is no evidence of fracture, dislocation, or joint effusion. There is no evidence of arthropathy or other focal bone abnormality. Soft tissues are unremarkable. IMPRESSION: Negative. Electronically Signed   By: Suzen Dials M.D.   On: 08/30/2024 18:03     ASSESSMENT/PLAN:  Assessment: Principal Problem:   Fall Active Problems:   Tobacco abuse   COPD (chronic obstructive pulmonary disease) (HCC)   Plan: ##Fall #Proximal femoral fracture #Left Elbow wound 2/2 fall Patient sustained a mechanical fall in the setting of acute alcohol intoxication, resulting in an acute impacted fracture of the proximal left femur. CT head and cervical spine are negative for acute injury. Orthopedic Surgery evaluated the patient in the ED and recommends medical optimization with anticipated surgical repair or possible replacement within the next several days. 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 While the patient denies prior withdrawal history, his degree of use places him at high risk for withdrawal. CIWA 3 - CIWA protocol with lorazepam  PRN - Thiamine  100 mg daily - Folate 1 mg daily - Multivitamin with minerals 1 tablet daily - Monitor closely for withdrawal symptoms  #Hyponatremia Sodium 127 on admission, likely acute on chronic hyponatremia in the setting of chronic alcohol use and suspected beer potomania. Patient is asymptomatic and neurologically intact - Continue fluid restriction - Monitor BMP  #Cirrhotic Hepatic Morphology #Trace RUQ Ascites #Chronic Untreated Hepatitis C Imaging demonstrates cirrhotic hepatic morphology without focal lesions and trace right upper quadrant ascites. Likely secondary to chronic alcohol use and untreated hepatitis C. MELD score approximately 8. No current evidence of decompensation (no encephalopathy, variceal bleeding, SBP, or significant ascites). Labs notable for AST > ALT, consistent with alcohol-related liver injury. - Monitor for signs of decompensation (ascites, encephalopathy, infection) - No indication for SBP prophylaxis at this time  #Chronic Medical Conditions COPD: Monitor respiratory status Tobacco Use Disorder: Continue Nicotine  patch 21 mg daily  Best Practice: Diet: Regular diet and with fluid restriction IVF: Fluids: None VTE: enoxaparin  (LOVENOX ) injection 40 mg Start:  08/30/24 2200 Code: Full AB: N/A Pain Medicine: acetaminophen  500mg  QID, Dilaudid  Q4 PRN for mod pain, IV dilaudid  Q8H for severe pain, lidocaine  5% patch q24 hrs Bowel Regimen: N/A Therapy Recs: Pending, DME: none Family Contact:  Contact Information   None  on File    Other Contacts     Name Relation Home Work T[place]ey, Ed Brother [phone]     Mardy Alm Rode   [phone]       DISPO: Anticipated discharge in 3-5 days to Rehab pending surgical procedure.  Signature: Alan Maiden, MD Psychiatry Resident, PGY-1 [facility]  Pager: # [phone]. 8:53 AM, 08/31/2024   "

## 2024-09-01 ENCOUNTER — Inpatient Hospital Stay (HOSPITAL_COMMUNITY)

## 2024-09-01 ENCOUNTER — Encounter (HOSPITAL_COMMUNITY): Admitting: Anesthesiology

## 2024-09-01 ENCOUNTER — Encounter (HOSPITAL_COMMUNITY): Payer: Self-pay

## 2024-09-01 ENCOUNTER — Encounter (HOSPITAL_COMMUNITY)
Admission: EM | Disposition: A | Discharge status: Skilled Nursing Facility | Payer: Self-pay | Source: Home / Self Care | Attending: Infectious Diseases

## 2024-09-01 ENCOUNTER — Inpatient Hospital Stay (HOSPITAL_COMMUNITY): Admitting: Anesthesiology

## 2024-09-01 DIAGNOSIS — F32A Depression, unspecified: Secondary | ICD-10-CM

## 2024-09-01 DIAGNOSIS — J449 Chronic obstructive pulmonary disease, unspecified: Secondary | ICD-10-CM | POA: Diagnosis not present

## 2024-09-01 DIAGNOSIS — F10129 Alcohol abuse with intoxication, unspecified: Secondary | ICD-10-CM | POA: Diagnosis not present

## 2024-09-01 DIAGNOSIS — S72002A Fracture of unspecified part of neck of left femur, initial encounter for closed fracture: Secondary | ICD-10-CM | POA: Diagnosis not present

## 2024-09-01 DIAGNOSIS — F10188 Alcohol abuse with other alcohol-induced disorder: Secondary | ICD-10-CM | POA: Diagnosis not present

## 2024-09-01 DIAGNOSIS — W19XXXA Unspecified fall, initial encounter: Secondary | ICD-10-CM | POA: Diagnosis not present

## 2024-09-01 DIAGNOSIS — F17210 Nicotine dependence, cigarettes, uncomplicated: Secondary | ICD-10-CM

## 2024-09-01 HISTORY — PX: TOTAL HIP ARTHROPLASTY: SHX124

## 2024-09-01 LAB — VITAMIN D 25 HYDROXY (VIT D DEFICIENCY, FRACTURES): Vit D, 25-Hydroxy: 7.9 ng/mL — ABNORMAL LOW (ref 30–100)

## 2024-09-01 LAB — CBC
HCT: 37.2 % — ABNORMAL LOW (ref 39.0–52.0)
Hemoglobin: 13.7 g/dL (ref 13.0–17.0)
MCH: 36.2 pg — ABNORMAL HIGH (ref 26.0–34.0)
MCHC: 36.8 g/dL — ABNORMAL HIGH (ref 30.0–36.0)
MCV: 98.4 fL (ref 80.0–100.0)
Platelets: 72 K/uL — ABNORMAL LOW (ref 150–400)
RBC: 3.78 MIL/uL — ABNORMAL LOW (ref 4.22–5.81)
RDW: 13.2 % (ref 11.5–15.5)
WBC: 8.7 K/uL (ref 4.0–10.5)
nRBC: 0 % (ref 0.0–0.2)

## 2024-09-01 LAB — BASIC METABOLIC PANEL WITH GFR
Anion gap: 13 (ref 5–15)
BUN: 13 mg/dL (ref 8–23)
CO2: 21 mmol/L — ABNORMAL LOW (ref 22–32)
Calcium: 8.5 mg/dL — ABNORMAL LOW (ref 8.9–10.3)
Chloride: 98 mmol/L (ref 98–111)
Creatinine, Ser: 0.57 mg/dL — ABNORMAL LOW (ref 0.61–1.24)
GFR, Estimated: >60 mL/min
Glucose, Bld: 110 mg/dL — ABNORMAL HIGH (ref 70–99)
Potassium: 4 mmol/L (ref 3.5–5.1)
Sodium: 131 mmol/L — ABNORMAL LOW (ref 135–145)

## 2024-09-01 LAB — ABO/RH: ABO/RH(D): O POS

## 2024-09-01 LAB — SURGICAL PCR SCREEN
MRSA, PCR: NEGATIVE
Staphylococcus aureus: NEGATIVE

## 2024-09-01 LAB — TYPE AND SCREEN
ABO/RH(D): O POS
Antibody Screen: NEGATIVE

## 2024-09-01 SURGERY — ARTHROPLASTY, HIP, TOTAL, ANTERIOR APPROACH
Anesthesia: General | Site: Hip | Laterality: Left

## 2024-09-01 MED ORDER — DIPHENHYDRAMINE HCL 12.5 MG/5ML PO ELIX: 12.5000 mg | ORAL_SOLUTION | ORAL | Status: DC | PRN

## 2024-09-01 MED ORDER — ROCURONIUM BROMIDE 10 MG/ML (PF) SYRINGE
PREFILLED_SYRINGE | INTRAVENOUS | Status: DC | PRN
  Administered 2024-09-01: 20 mg via INTRAVENOUS
  Administered 2024-09-01: 50 mg via INTRAVENOUS

## 2024-09-01 MED ORDER — HYDROMORPHONE HCL 1 MG/ML IJ SOLN
0.5000 mg | INTRAMUSCULAR | Status: DC | PRN
  Filled 2024-09-01: qty 1

## 2024-09-01 MED ORDER — MENTHOL 3 MG MT LOZG: 1.0000 | LOZENGE | OROMUCOSAL | Status: DC | PRN

## 2024-09-01 MED ORDER — VANCOMYCIN HCL 1000 MG IV SOLR
INTRAVENOUS | Status: AC
  Filled 2024-09-01: qty 20

## 2024-09-01 MED ORDER — LIDOCAINE 2% (20 MG/ML) 5 ML SYRINGE
INTRAMUSCULAR | Status: DC | PRN
  Administered 2024-09-01: 60 mg via INTRAVENOUS

## 2024-09-01 MED ORDER — PROPOFOL 10 MG/ML IV BOLUS
INTRAVENOUS | Status: AC
  Filled 2024-09-01: qty 20

## 2024-09-01 MED ORDER — DEXAMETHASONE SOD PHOSPHATE PF 10 MG/ML IJ SOLN
INTRAMUSCULAR | Status: DC | PRN
  Administered 2024-09-01: 10 mg via INTRAVENOUS

## 2024-09-01 MED ORDER — FENTANYL CITRATE (PF) 100 MCG/2ML IJ SOLN
INTRAMUSCULAR | Status: AC
  Filled 2024-09-01: qty 2

## 2024-09-01 MED ORDER — HYDROMORPHONE HCL 1 MG/ML IJ SOLN: 0.2500 mg | INTRAMUSCULAR | Status: DC | PRN

## 2024-09-01 MED ORDER — DROPERIDOL 2.5 MG/ML IJ SOLN: 0.6250 mg | Freq: Once | INTRAMUSCULAR | Status: DC | PRN

## 2024-09-01 MED ORDER — MIDAZOLAM HCL (PF) 2 MG/2ML IJ SOLN
INTRAMUSCULAR | Status: DC | PRN
  Administered 2024-09-01: 2 mg via INTRAVENOUS

## 2024-09-01 MED ORDER — HYDROMORPHONE HCL 1 MG/ML IJ SOLN
INTRAMUSCULAR | Status: AC
  Filled 2024-09-01: qty 0.5

## 2024-09-01 MED ORDER — ASPIRIN 81 MG PO CHEW
81.0000 mg | CHEWABLE_TABLET | Freq: Two times a day (BID) | ORAL | Status: DC
  Administered 2024-09-01 – 2024-09-08 (×14): 81 mg via ORAL
  Filled 2024-09-01 (×5): qty 1

## 2024-09-01 MED ORDER — MIDAZOLAM HCL 2 MG/2ML IJ SOLN
INTRAMUSCULAR | Status: AC
  Filled 2024-09-01: qty 2

## 2024-09-01 MED ORDER — HYDROMORPHONE HCL 1 MG/ML IJ SOLN
INTRAMUSCULAR | Status: DC | PRN
  Administered 2024-09-01: .5 mg via INTRAVENOUS

## 2024-09-01 MED ORDER — METHOCARBAMOL 1000 MG/10ML IJ SOLN: 500.0000 mg | Freq: Four times a day (QID) | INTRAMUSCULAR | Status: DC | PRN

## 2024-09-01 MED ORDER — ORAL CARE MOUTH RINSE: 15.0000 mL | Freq: Once | OROMUCOSAL | Status: AC

## 2024-09-01 MED ORDER — ONDANSETRON HCL 4 MG PO TABS: 4.0000 mg | ORAL_TABLET | Freq: Four times a day (QID) | ORAL | Status: DC | PRN

## 2024-09-01 MED ORDER — ONDANSETRON HCL 4 MG/2ML IJ SOLN
INTRAMUSCULAR | Status: DC | PRN
  Administered 2024-09-01: 4 mg via INTRAVENOUS

## 2024-09-01 MED ORDER — METOCLOPRAMIDE HCL 5 MG PO TABS: 5.0000 mg | ORAL_TABLET | Freq: Three times a day (TID) | ORAL | Status: DC | PRN

## 2024-09-01 MED ORDER — STERILE WATER FOR IRRIGATION IR SOLN
Status: DC | PRN
  Administered 2024-09-01: 1000 mL

## 2024-09-01 MED ORDER — PROPOFOL 10 MG/ML IV BOLUS
INTRAVENOUS | Status: DC | PRN
  Administered 2024-09-01: 70 mg via INTRAVENOUS

## 2024-09-01 MED ORDER — PHENOL 1.4 % MT LIQD: 1.0000 | OROMUCOSAL | Status: DC | PRN

## 2024-09-01 MED ORDER — PHENYLEPHRINE 80 MCG/ML (10ML) SYRINGE FOR IV PUSH (FOR BLOOD PRESSURE SUPPORT)
PREFILLED_SYRINGE | INTRAVENOUS | Status: DC | PRN
  Administered 2024-09-01 (×4): 80 ug via INTRAVENOUS

## 2024-09-01 MED ORDER — FENTANYL CITRATE (PF) 250 MCG/5ML IJ SOLN
INTRAMUSCULAR | Status: DC | PRN
  Administered 2024-09-01 (×2): 50 ug via INTRAVENOUS

## 2024-09-01 MED ORDER — CHLORHEXIDINE GLUCONATE 0.12 % MT SOLN
OROMUCOSAL | Status: AC
  Administered 2024-09-01: 15 mL via OROMUCOSAL
  Filled 2024-09-01: qty 15

## 2024-09-01 MED ORDER — DOCUSATE SODIUM 100 MG PO CAPS
100.0000 mg | ORAL_CAPSULE | Freq: Two times a day (BID) | ORAL | Status: DC
  Administered 2024-09-01 (×2): 100 mg via ORAL
  Filled 2024-09-01 (×2): qty 1

## 2024-09-01 MED ORDER — LACTATED RINGERS IV SOLN: INTRAVENOUS | Status: DC

## 2024-09-01 MED ORDER — CHLORHEXIDINE GLUCONATE 0.12 % MT SOLN: 15.0000 mL | Freq: Once | OROMUCOSAL | Status: AC

## 2024-09-01 MED ORDER — PHENYLEPHRINE HCL-NACL 20-0.9 MG/250ML-% IV SOLN
INTRAVENOUS | Status: DC | PRN
  Administered 2024-09-01: 50 ug/min via INTRAVENOUS

## 2024-09-01 MED ORDER — ASPIRIN 81 MG PO TBEC: 81.0000 mg | DELAYED_RELEASE_TABLET | Freq: Every day | ORAL | Status: DC

## 2024-09-01 MED ORDER — ACETAMINOPHEN 500 MG PO TABS
1000.0000 mg | ORAL_TABLET | Freq: Once | ORAL | Status: AC
  Administered 2024-09-01: 1000 mg via ORAL
  Filled 2024-09-01: qty 2

## 2024-09-01 MED ORDER — CEFAZOLIN SODIUM-DEXTROSE 2-4 GM/100ML-% IV SOLN
2.0000 g | Freq: Four times a day (QID) | INTRAVENOUS | Status: AC
  Administered 2024-09-01: 2 g via INTRAVENOUS
  Filled 2024-09-01 (×2): qty 100

## 2024-09-01 MED ORDER — ONDANSETRON HCL 4 MG/2ML IJ SOLN: 4.0000 mg | Freq: Four times a day (QID) | INTRAMUSCULAR | Status: DC | PRN

## 2024-09-01 MED ORDER — METOCLOPRAMIDE HCL 5 MG/ML IJ SOLN: 5.0000 mg | Freq: Three times a day (TID) | INTRAMUSCULAR | Status: DC | PRN

## 2024-09-01 MED ORDER — OXYCODONE HCL 5 MG PO TABS: 2.5000 mg | ORAL_TABLET | ORAL | Status: DC | PRN

## 2024-09-01 MED ORDER — LACTATED RINGERS IV SOLN: INTRAVENOUS | Status: DC | PRN

## 2024-09-01 MED ORDER — VANCOMYCIN HCL 1000 MG IV SOLR
INTRAVENOUS | Status: DC | PRN
  Administered 2024-09-01: 1000 mg via TOPICAL

## 2024-09-01 MED ORDER — METHOCARBAMOL 500 MG PO TABS
500.0000 mg | ORAL_TABLET | Freq: Four times a day (QID) | ORAL | Status: DC | PRN
  Administered 2024-09-04: 500 mg via ORAL

## 2024-09-01 MED ORDER — SODIUM CHLORIDE 0.9% IV SOLUTION: Freq: Once | INTRAVENOUS | Status: AC

## 2024-09-01 MED ORDER — 0.9 % SODIUM CHLORIDE (POUR BTL) OPTIME
TOPICAL | Status: DC | PRN
  Administered 2024-09-01: 1000 mL

## 2024-09-01 MED ORDER — ALBUMIN HUMAN 5 % IV SOLN: INTRAVENOUS | Status: DC | PRN

## 2024-09-01 MED ORDER — TRANEXAMIC ACID-NACL 1000-0.7 MG/100ML-% IV SOLN
1000.0000 mg | Freq: Once | INTRAVENOUS | Status: AC
  Administered 2024-09-01: 1000 mg via INTRAVENOUS
  Filled 2024-09-01: qty 100

## 2024-09-01 SURGICAL SUPPLY — 46 items
BAG COUNTER SPONGE SURGICOUNT (BAG) ×1 IMPLANT
BIT DRILL 7/64X5 DISP (BIT) ×1 IMPLANT
BIT DRILL RINGLOC QUICK CONN (BIT) IMPLANT
BLADE SAG 18X100X1.27 (BLADE) ×1 IMPLANT
BNDG COHESIVE 6X5 TAN ST LF (GAUZE/BANDAGES/DRESSINGS) ×1 IMPLANT
CHLORAPREP W/TINT 26 (MISCELLANEOUS) ×1 IMPLANT
COVER PERINEAL POST (MISCELLANEOUS) ×1 IMPLANT
COVER SURGICAL LIGHT HANDLE (MISCELLANEOUS) ×1 IMPLANT
DERMABOND ADVANCED .7 DNX12 (GAUZE/BANDAGES/DRESSINGS) ×1 IMPLANT
DRAPE C-ARM 42X72 X-RAY (DRAPES) ×1 IMPLANT
DRAPE IMP U-DRAPE 54X76 (DRAPES) ×1 IMPLANT
DRAPE STERI IOBAN 125X83 (DRAPES) ×1 IMPLANT
DRAPE U-SHAPE 47X51 STRL (DRAPES) IMPLANT
DRESSING AQUACEL AG SP 3.5X6 (GAUZE/BANDAGES/DRESSINGS) ×1 IMPLANT
DRSG AQUACEL AG ADV 3.5X10 (GAUZE/BANDAGES/DRESSINGS) IMPLANT
ELECT BLADE 6.5 EXT (BLADE) IMPLANT
ELECT CAUTERY BLADE 6.4 (BLADE) IMPLANT
ELECTRODE REM PT RTRN 9FT ADLT (ELECTROSURGICAL) ×1 IMPLANT
GLOVE BIO SURGEON STRL SZ 6.5 (GLOVE) ×3 IMPLANT
GLOVE BIO SURGEON STRL SZ7.5 (GLOVE) ×3 IMPLANT
GLOVE BIOGEL PI IND STRL 6.5 (GLOVE) ×1 IMPLANT
GLOVE BIOGEL PI IND STRL 7.5 (GLOVE) ×1 IMPLANT
GOWN STRL REUS W/ TWL LRG LVL3 (GOWN DISPOSABLE) ×2 IMPLANT
HEAD FEM -3XOFST 36XMDLR (Head) IMPLANT
HOOD PEEL AWAY T7 (MISCELLANEOUS) ×3 IMPLANT
KIT BASIN OR (CUSTOM PROCEDURE TRAY) ×1 IMPLANT
KIT TURNOVER KIT B (KITS) ×1 IMPLANT
LINER ACETAB NN G7 F 36 (Liner) IMPLANT
MANIFOLD NEPTUNE II (INSTRUMENTS) ×1 IMPLANT
PACK TOTAL JOINT (CUSTOM PROCEDURE TRAY) ×1 IMPLANT
PACK UNIVERSAL I (CUSTOM PROCEDURE TRAY) ×1 IMPLANT
PAD ARMBOARD POSITIONER FOAM (MISCELLANEOUS) ×2 IMPLANT
SCREW BONE SELF TAP 6.5X35MM (Screw) IMPLANT
SET INTERPULSE LAVAGE W/TIP (ORTHOPEDIC DISPOSABLE SUPPLIES) ×1 IMPLANT
SHELL ACET G7 4H 56 SZF (Shell) IMPLANT
SOLN 0.9% NACL POUR BTL 1000ML (IV SOLUTION) ×1 IMPLANT
SOLN STERILE WATER BTL 1000 ML (IV SOLUTION) ×1 IMPLANT
STAPLER SKIN PROX 35W (STAPLE) IMPLANT
STEM FEM CMTLS 13 146 (Stem) IMPLANT
SUT ETHIBOND NAB CT1 #1 30IN (SUTURE) ×1 IMPLANT
SUT MNCRL AB 3-0 PS2 18 (SUTURE) IMPLANT
SUT MON AB 2-0 SH 27 (SUTURE) ×1 IMPLANT
SUT MON AB 2-0 SH27 (SUTURE) IMPLANT
SUT VIC AB 0 CT1 36 (SUTURE) IMPLANT
TOWEL GREEN STERILE (TOWEL DISPOSABLE) ×1 IMPLANT
TUBE SUCT ARGYLE STRL (TUBING) ×1 IMPLANT

## 2024-09-01 NOTE — Progress Notes (Signed)
 Unable to reach nurse for report at this time. Secure Chat since with request to return call.

## 2024-09-01 NOTE — Anesthesia Postprocedure Evaluation (Signed)
"   Anesthesia Post Note  Patient: Halim Surrette Meskill  Procedure(s) Performed: ARTHROPLASTY, HIP, TOTAL, ANTERIOR APPROACH (Left: Hip)     Patient location during evaluation: PACU Anesthesia Type: General Level of consciousness: sedated and patient cooperative Pain management: pain level controlled Vital Signs Assessment: post-procedure vital signs reviewed and stable Respiratory status: spontaneous breathing Cardiovascular status: stable Anesthetic complications: no   No notable events documented.  Last Vitals:  Vitals:   09/01/24 1155 09/01/24 1423  BP: 129/82 103/67  Pulse: 95 80  Resp: 17 16  Temp:  36.8 C  SpO2: 97% 97%    Last Pain:  Vitals:   09/01/24 1423  TempSrc: Oral  PainSc:                  Norleen Pope      "

## 2024-09-01 NOTE — Anesthesia Preprocedure Evaluation (Addendum)
"                                    Anesthesia Evaluation  Patient identified by MRN, date of birth, ID band Patient awake    Reviewed: Allergy & Precautions, NPO status , Patient's Chart, lab work & pertinent test results  Airway Mallampati: III  TM Distance: >3 FB Neck ROM: Limited    Dental  (+) Edentulous Upper, Edentulous Lower, Dental Advisory Given   Pulmonary COPD, Current Smoker and Patient abstained from smoking.   Pulmonary exam normal breath sounds clear to auscultation       Cardiovascular negative cardio ROS Normal cardiovascular exam Rhythm:Regular Rate:Normal     Neuro/Psych  PSYCHIATRIC DISORDERS  Depression    negative neurological ROS     GI/Hepatic negative GI ROS,,,(+) Cirrhosis     substance abuse  alcohol use, Hepatitis -, C  Endo/Other  negative endocrine ROS    Renal/GU negative Renal ROS     Musculoskeletal negative musculoskeletal ROS (+)    Abdominal   Peds  Hematology  (+) Blood dyscrasia   Anesthesia Other Findings   Reproductive/Obstetrics                              Anesthesia Physical Anesthesia Plan  ASA: 3  Anesthesia Plan: General   Post-op Pain Management: Tylenol  PO (pre-op)*, Precedex and Gabapentin PO (pre-op)*   Induction: Intravenous  PONV Risk Score and Plan: 2 and Ondansetron , Dexamethasone  and Treatment may vary due to age or medical condition  Airway Management Planned: Oral ETT  Additional Equipment:   Intra-op Plan:   Post-operative Plan: Extubation in OR  Informed Consent: I have reviewed the patients History and Physical, chart, labs and discussed the procedure including the risks, benefits and alternatives for the proposed anesthesia with the patient or authorized representative who has indicated his/her understanding and acceptance.     Dental advisory given  Plan Discussed with: CRNA  Anesthesia Plan Comments:          Anesthesia Quick  Evaluation  "

## 2024-09-01 NOTE — Transfer of Care (Signed)
 Immediate Anesthesia Transfer of Care Note  Patient: Michael Patton  Procedure(s) Performed: ARTHROPLASTY, HIP, TOTAL, ANTERIOR APPROACH (Left: Hip)  Patient Location: PACU  Anesthesia Type:General  Level of Consciousness: drowsy and patient cooperative  Airway & Oxygen Therapy: Patient Spontanous Breathing and Patient connected to face mask oxygen  Post-op Assessment: Report given to RN, Post -op Vital signs reviewed and stable, Patient moving all extremities, and Patient moving all extremities X 4  Post vital signs: Reviewed and stable  Last Vitals:  Vitals Value Taken Time  BP 118/71 09/01/24 10:15  Temp 97.2 09/01/24  10:15  Pulse 91 09/01/24 10:17  Resp 26 09/01/24 10:17  SpO2 100 % 09/01/24 10:17  Vitals shown include unfiled device data.  Last Pain:  Vitals:   09/01/24 0724  TempSrc: Oral  PainSc: 9       Patients Stated Pain Goal: 3 (09/01/24 0724)  Complications: No notable events documented.

## 2024-09-01 NOTE — Anesthesia Procedure Notes (Signed)
 Procedure Name: Intubation Date/Time: 09/01/2024 8:01 AM  Performed by: Arvell Edsel HERO, CRNAPre-anesthesia Checklist: Emergency Drugs available, Patient identified, Suction available, Patient being monitored and Timeout performed Patient Re-evaluated:Patient Re-evaluated prior to induction Oxygen Delivery Method: Circle system utilized Preoxygenation: Pre-oxygenation with 100% oxygen Induction Type: IV induction Ventilation: Mask ventilation without difficulty Laryngoscope Size: Mac and 4 Grade View: Grade I Tube type: Oral Tube size: 7.0 mm Number of attempts: 1 Airway Equipment and Method: Patient positioned with wedge pillow and Video-laryngoscopy Placement Confirmation: ETT inserted through vocal cords under direct vision, positive ETCO2 and breath sounds checked- equal and bilateral Secured at: 23 cm Tube secured with: Tape Dental Injury: Teeth and Oropharynx as per pre-operative assessment

## 2024-09-01 NOTE — Progress Notes (Signed)
 OT Cancellation Note  Patient Details Name: BARLOW HARRISON MRN: [id number] DOB: 1956/03/05   Cancelled Treatment:    Reason Eval/Treat Not Completed: Patient at procedure or test/ unavailable -OR  Lucie JONETTA Kendall 09/01/2024, 7:43 AM

## 2024-09-01 NOTE — Progress Notes (Signed)
 PT Cancellation Note  Patient Details Name: EDVIN ALBUS MRN: [id number] DOB: 06/18/1956   Cancelled Treatment:    Reason Eval/Treat Not Completed: Patient at procedure or test/unavailable (Pt off the floor at OR.)   Azile Minardi 09/01/2024, 8:59 AM

## 2024-09-01 NOTE — Progress Notes (Signed)
" °   08/31/24 2100  Assess: MEWS Score  Temp 98.9 F (37.2 C)  BP (!) 171/114  MAP (mmHg) 132  Pulse Rate (!) 118  Resp 18  Level of Consciousness Alert  SpO2 94 %  O2 Device Nasal Cannula  O2 Flow Rate (L/min) 2 L/min  Assess: MEWS Score  MEWS Temp 0  MEWS Systolic 0  MEWS Pulse 2  MEWS RR 0  MEWS LOC 0  MEWS Score 2  MEWS Score Color Yellow  Assess: if the MEWS score is Yellow or Red  Were vital signs accurate and taken at a resting state? Yes  Does the patient meet 2 or more of the SIRS criteria? No  MEWS guidelines implemented  Yes, yellow  Treat  MEWS Interventions Considered administering scheduled or prn medications/treatments as ordered  Take Vital Signs  Increase Vital Sign Frequency  Yellow: Q2hr x1, continue Q4hrs until patient remains green for 12hrs  Escalate  MEWS: Escalate Yellow: Discuss with charge nurse and consider notifying provider and/or RRT  Notify: Charge Nurse/RN  Name of Charge Nurse/RN Notified Mahima, RN  Provider Notification  Provider Name/Title Renne, MD  Date Provider Notified 08/31/24  Time Provider Notified 2124  Method of Notification Page  Notification Reason Other (Comment) (Yellow MEWS)  Provider response See new orders  Date of Provider Response 08/31/24  Time of Provider Response 2130  Assess: SIRS CRITERIA  SIRS Temperature  0  SIRS Respirations  0  SIRS Pulse 1  SIRS WBC 0  SIRS Score Sum  1    "

## 2024-09-01 NOTE — Op Note (Signed)
 Orthopaedic Surgery Operative Note (CSN: [id number] ) Date of Surgery: 09/01/2024  Admit Date: 08/30/2024   Diagnoses: Pre-Op Diagnoses: Left displaced femoral neck fracture  Post-Op Diagnosis: Same  Procedures: CPT 27130-Left total hip arthroplasty for displaced femoral neck fracture  Surgeons : Primary: Kendal Franky SQUIBB, MD  Assistant: Lauraine Moores, PA-C  Location: OR 3   Anesthesia: General   Antibiotics: Ancef  2g preop with 1 gm vancomycin  powder placed topically   Tourniquet time: None    Estimated Blood Loss: 300 mL  Complications:* No complications entered in OR log *   Specimens:* No specimens in log *   Implants: Implant Name Type Inv. Item Serial No. Manufacturer Lot No. LRB No. Used Action  SHELL ACET G7 4H 56 SZF - [id number] Shell SHELL ACET G7 4H 56 SZF  [employer](ORTH,TRAU,BIO,SG) [id number] Left 1 Implanted  SCREW BONE SELF TAP 6.5X35MM - [id number] Screw SCREW BONE SELF TAP 6.5X35MM  [employer](ORTH,TRAU,BIO,SG) [id number] Left 1 Implanted  LINER ACETAB NN G7 F 36 - [id number] Liner LINER ACETAB NN G7 F 36  [employer](ORTH,TRAU,BIO,SG) [id number] Left 1 Implanted  STEM FEM CMTLS 13 146 - [id number] Stem STEM FEM CMTLS 13 146  ZIMMER [employer](ORTH,TRAU,BIO,SG) [id number] Left 1 Implanted  HEAD FEM -3XOFST 36XMDLR - [id number] Head HEAD FEM -3XOFST 36XMDLR  [employer](ORTH,TRAU,BIO,SG) [id number] Left 1 Implanted     Indications for Surgery: 68 year old male who sustained a ground-level fall with a left displaced femoral neck fracture.  Due to the unstable nature of his injury and his activity level I recommend proceeding with a left total hip arthroplasty.  Risks and benefits were discussed with the patient.  Risks include but not limited to bleeding, infection, hip dislocation, leg length discrepancy, periprosthetic fracture, nerve or blood vessel injury, DVT, and the possibility anesthetic complications.  He agreed to proceed with surgery and consent was  obtained.  Operative Findings: 1.  Left total hip arthroplasty through anterior approach for left femoral neck fracture using [employer] G7 acetabular shell 56 mm size F 2.  Supplemental fixation of acetabular shell using a [employer] 6.5 millimeter screw into the posterior column. 3.  Vitamin E highly cross-linked polyethylene for 36 mm head size F 4.  [employer] cementless Taperloc reduced distal size 13 high offset stem 5.  [employer] modular head system size 36 mm with a -3 mm neck  Procedure: The patient was identified in the preoperative holding area. Consent was confirmed with the patient and their family and all questions were answered. The operative extremity was marked after confirmation with the patient. he was then brought back to the operating room by our anesthesia colleagues.  He was placed under general anesthetic and carefully transferred over to a Hana table.  Fluoroscopic imaging showed the unstable nature of his injury.  The left hip was then prepped and draped in usual sterile fashion.  A timeout was performed to verify the patient, the procedure, and the extremity.  Preoperative antibiotics were dosed.  A standard anterior approach to the hip was made and carried down through skin and subcutaneous tissue.  I incised through the TFL fascia lateral to the ASIS and entered the interval between the TFL and sartorius.  I then expose the anterior capsule and cauterized the crossing vessels.  I then performed my capsulotomy entering the hip joint.  I released the inferior capsule all the way down to the lesser trochanter.  I then made an intercalary neck cut and then proceeded to remove the femoral  head with a corkscrew.  I measured the head size and it was approximately 15 mm in size.  I then sequentially reamed from 49 mm to 55 mm.  I got good rim fit and good cancellous bleeding bone.  I then chose to press-fit a 56 mm shell and got good version and abduction using  fluoroscopic imaging as a guide.  I supplemented the fixation with a 6.5 mm bone screw into the posterior column.  I then placed a vitamin E highly cross-linked polyethylene for a 36 mm head.  I then turned my attention to the femur.  The leg was externally rotated 225 degrees.  The leg was delivered through the wound.  I performed a superior capsular release to access the femur appropriately for broaching.  I then used a canal finder and sequentially broached from size 4 to size 13.  I had excellent rotational control and fit of the proximal stem.  I then trialed off of this with a standard offset neck and a -3 head.  The hip was reduced and leg lengths were nearly symmetric there was a little bit of laxity in the hip and decreased offset compared to the contralateral side.  As result I redislocated the hip proceeded to place the final size 13 implant with a high offset neck.  The stem sat just a slight amount proud compared to the trial implant and so I proceeded to place a -3 mm neck 36 mm head and reduced the hip.  Final fluoroscopic imaging was obtained.  The incision was copiously irrigated.  A gram of vancomycin  powder was placed to the incision.  The capsule was closed with #1 Ethibond.  The TFL fascia was closed with 0 Vicryl, the skin was closed with 2-0 Monocryl and 3-0 Monocryl and Dermabond.  Sterile dressings were applied.  The patient was then awoke from anesthesia and taken to the PACU in stable condition.  Post Op Plan/Instructions: The patient be weightbearing as tolerated to the left lower extremity.  He will receive postoperative Ancef .  He will be placed on aspirin  81 mg for DVT prophylaxis.  Will have him mobilize with physical and Occupational Therapy.  There will be no hip precautions postoperatively.  I was present and performed the entire surgery.  Lauraine Moores, PA-C did assist me throughout the case. An assistant was necessary given the difficulty in approach, maintenance of  reduction and ability to instrument the fracture.   Franky Light, MD [facility]

## 2024-09-01 NOTE — Consult Note (Signed)
 Orthopaedic Trauma Service (OTS) Consult   Patient ID: Michael Patton MRN: [id number] DOB/AGE: [birth year] 67 y.o.  Reason for Consult:Left displaced femoral neck fracture Referring Physician: Dr. Selinda Gosling, MD [facility]  HPI: QUINTAVIUS NIEBUHR is an 68 y.o. male who is being seen in consultation at the request of Dr. Gosling for evaluation of left femoral neck fracture. Patient sustained a fall had immediate pain and inability to ambulate and was brought in and found to have a femoral neck fracture.  Patient has a history of alcohol use.  He lives in a complex.  He ambulates without assist device.  Denies any other injuries.  Past Medical History:  Diagnosis Date   Alcoholism (HCC)    COPD (chronic obstructive pulmonary disease) (HCC)    Depression    Hepatitis C     Past Surgical History:  Procedure Laterality Date   IR PARACENTESIS  11/12/2020    History reviewed. No pertinent family history.  Social History:  reports that he has been smoking cigarettes. He has never used smokeless tobacco. He reports current alcohol use of about 50.0 standard drinks of alcohol per week. He reports that he does not use drugs.  Allergies: Allergies[1]  Medications: Medications Ordered Prior to Encounter[2]   ROS: Constitutional: No fever or chills Vision: No changes in vision ENT: No difficulty swallowing CV: No chest pain Pulm: No SOB or wheezing GI: No nausea or vomiting GU: No urgency or inability to hold urine Skin: No poor wound healing Neurologic: No numbness or tingling Psychiatric: No depression or anxiety Heme: No bruising Allergic: No reaction to medications or food   Exam: Blood pressure (!) 153/88, pulse (!) 124, temperature 99.2 F (37.3 C), temperature source Oral, resp. rate 18, height 5' 9 (1.753 m), weight 61 kg, SpO2 96%. General: No acute distress Orientation: Awake and alert and oriented x 3 Mood and Affect: Cooperative and appropriate Gait: Unable to assess  due to his fracture Coordination and balance: Within normal limits  Left lower extremity: Skin is intact.  Leg is shortened externally rotated.  Compartments are soft compressible.  He has 2+ DP and PT pulses.  He has a warm well-perfused foot with brisk cap refill he endorses sensation to the dorsum and plantar aspect of his foot.  He is able to move his foot without difficulty.  Right lower extremity: Skin without lesions. No tenderness to palpation. Full painless ROM, full strength in each muscle groups without evidence of instability.   Medical Decision Making: Data: Imaging: X-rays and CT scan are reviewed which shows a displaced left femoral neck fracture.  Labs:  Results for orders placed or performed during the hospital encounter of 08/30/24 (from the past 24 hours)  Sodium, urine, random     Status: None   Collection Time: 08/31/24  8:27 AM  Result Value Ref Range   Sodium, Ur <30 mmol/L  Urinalysis, Routine w reflex microscopic -Urine, Clean Catch     Status: Abnormal   Collection Time: 08/31/24  8:27 AM  Result Value Ref Range   Color, Urine AMBER (A) YELLOW   APPearance HAZY (A) CLEAR   Specific Gravity, Urine 1.015 1.005 - 1.030   pH 5.0 5.0 - 8.0   Glucose, UA NEGATIVE NEGATIVE mg/dL   Hgb urine dipstick MODERATE (A) NEGATIVE   Bilirubin Urine NEGATIVE NEGATIVE   Ketones, ur 5 (A) NEGATIVE mg/dL   Protein, ur NEGATIVE NEGATIVE mg/dL   Nitrite POSITIVE (A) NEGATIVE   Leukocytes,Ua LARGE (A) NEGATIVE  RBC / HPF 0-5 0 - 5 RBC/hpf   WBC, UA 21-50 0 - 5 WBC/hpf   Bacteria, UA RARE (A) NONE SEEN   Squamous Epithelial / HPF 0-5 0 - 5 /HPF   Mucus PRESENT   Urine Drug Screen     Status: Abnormal   Collection Time: 08/31/24  8:27 AM  Result Value Ref Range   Opiates POSITIVE (A) NEGATIVE   Cocaine NEGATIVE NEGATIVE   Benzodiazepines NEGATIVE NEGATIVE   Amphetamines NEGATIVE NEGATIVE   Tetrahydrocannabinol NEGATIVE NEGATIVE   Barbiturates NEGATIVE NEGATIVE    Methadone Scn, Ur NEGATIVE NEGATIVE   Fentanyl  NEGATIVE NEGATIVE  Surgical PCR screen     Status: None   Collection Time: 08/31/24 11:22 PM   Specimen: Nasal Mucosa; Nasal Swab  Result Value Ref Range   MRSA, PCR NEGATIVE NEGATIVE   Staphylococcus aureus NEGATIVE NEGATIVE     Imaging or Labs ordered: None  Medical history and chart was reviewed and case discussed with medical provider.  Assessment/Plan: 68 year old male with a displaced left femoral neck fracture  Due to the unstable nature of his injury in his age and activity level I recommend proceeding with a left total hip arthroplasty.  Risks and benefits were discussed with the patient.  Risks include but not limited to bleeding, infection, hip dislocation, periprosthetic fracture, leg length discrepancy, DVT, even the possibility anesthetic complications.  He agreed to proceed with surgery and consent was obtained.  Surgery w/ risks or Emergency surgery: High complexity Risk (Level 5)  Franky MYRTIS Light, MD [facility] [phone] (office) [email or web address]       [1]  Allergies Allergen Reactions   Sertraline Rash  [2]  No current facility-administered medications on file prior to encounter.   Current Outpatient Medications on File Prior to Encounter  Medication Sig Dispense Refill   acetaminophen  (TYLENOL ) 500 MG tablet Take 1,000 mg by mouth every 6 (six) hours as needed for headache or mild pain (pain score 1-3).     carboxymethylcellulose (REFRESH PLUS) 0.5 % SOLN Place 2 drops into both eyes daily as needed (dry eyes).

## 2024-09-01 NOTE — Interval H&P Note (Signed)
 History and Physical Interval Note:  09/01/2024 7:28 AM  Michael Patton  has presented today for surgery, with the diagnosis of left hip fracture.  The various methods of treatment have been discussed with the patient and family. After consideration of risks, benefits and other options for treatment, the patient has consented to  Procedures: ARTHROPLASTY, HIP, TOTAL, ANTERIOR APPROACH (Left) as a surgical intervention.  The patient's history has been reviewed, patient examined, no change in status, stable for surgery.  I have reviewed the patient's chart and labs.  Questions were answered to the patient's satisfaction.     Denay Pleitez P Emonni Depasquale

## 2024-09-01 NOTE — H&P (View-Only) (Signed)
 Orthopaedic Trauma Service (OTS) Consult   Patient ID: Michael Patton MRN: [id number] DOB/AGE: [birth year] 67 y.o.  Reason for Consult:Left displaced femoral neck fracture Referring Physician: Dr. Selinda Gosling, MD [place]  HPI: Michael Patton is an 68 y.o. male who is being seen in consultation at the request of Dr. Gosling for evaluation of left femoral neck fracture. Patient sustained a fall had immediate pain and inability to ambulate and was brought in and found to have a femoral neck fracture.  Patient has a history of alcohol use.  He lives in a complex.  He ambulates without assist device.  Denies any other injuries.  Past Medical History:  Diagnosis Date   Alcoholism (HCC)    COPD (chronic obstructive pulmonary disease) (HCC)    Depression    Hepatitis C     Past Surgical History:  Procedure Laterality Date   IR PARACENTESIS  11/12/2020    History reviewed. No pertinent family history.  Social History:  reports that he has been smoking cigarettes. He has never used smokeless tobacco. He reports current alcohol use of about 50.0 standard drinks of alcohol per week. He reports that he does not use drugs.  Allergies: Allergies[1]  Medications: Medications Ordered Prior to Encounter[2]   ROS: Constitutional: No fever or chills Vision: No changes in vision ENT: No difficulty swallowing CV: No chest pain Pulm: No SOB or wheezing GI: No nausea or vomiting GU: No urgency or inability to hold urine Skin: No poor wound healing Neurologic: No numbness or tingling Psychiatric: No depression or anxiety Heme: No bruising Allergic: No reaction to medications or food   Exam: Blood pressure (!) 153/88, pulse (!) 124, temperature 99.2 F (37.3 C), temperature source Oral, resp. rate 18, height 5' 9 (1.753 m), weight 61 kg, SpO2 96%. General: No acute distress Orientation: Awake and alert and oriented x 3 Mood and Affect: Cooperative and appropriate Gait: Unable to assess  due to his fracture Coordination and balance: Within normal limits  Left lower extremity: Skin is intact.  Leg is shortened externally rotated.  Compartments are soft compressible.  He has 2+ DP and PT pulses.  He has a warm well-perfused foot with brisk cap refill he endorses sensation to the dorsum and plantar aspect of his foot.  He is able to move his foot without difficulty.  Right lower extremity: Skin without lesions. No tenderness to palpation. Full painless ROM, full strength in each muscle groups without evidence of instability.   Medical Decision Making: Data: Imaging: X-rays and CT scan are reviewed which shows a displaced left femoral neck fracture.  Labs:  Results for orders placed or performed during the hospital encounter of 08/30/24 (from the past 24 hours)  Sodium, urine, random     Status: None   Collection Time: 08/31/24  8:27 AM  Result Value Ref Range   Sodium, Ur <30 mmol/L  Urinalysis, Routine w reflex microscopic -Urine, Clean Catch     Status: Abnormal   Collection Time: 08/31/24  8:27 AM  Result Value Ref Range   Color, Urine AMBER (A) YELLOW   APPearance HAZY (A) CLEAR   Specific Gravity, Urine 1.015 1.005 - 1.030   pH 5.0 5.0 - 8.0   Glucose, UA NEGATIVE NEGATIVE mg/dL   Hgb urine dipstick MODERATE (A) NEGATIVE   Bilirubin Urine NEGATIVE NEGATIVE   Ketones, ur 5 (A) NEGATIVE mg/dL   Protein, ur NEGATIVE NEGATIVE mg/dL   Nitrite POSITIVE (A) NEGATIVE   Leukocytes,Ua LARGE (A) NEGATIVE  RBC / HPF 0-5 0 - 5 RBC/hpf   WBC, UA 21-50 0 - 5 WBC/hpf   Bacteria, UA RARE (A) NONE SEEN   Squamous Epithelial / HPF 0-5 0 - 5 /HPF   Mucus PRESENT   Urine Drug Screen     Status: Abnormal   Collection Time: 08/31/24  8:27 AM  Result Value Ref Range   Opiates POSITIVE (A) NEGATIVE   Cocaine NEGATIVE NEGATIVE   Benzodiazepines NEGATIVE NEGATIVE   Amphetamines NEGATIVE NEGATIVE   Tetrahydrocannabinol NEGATIVE NEGATIVE   Barbiturates NEGATIVE NEGATIVE    Methadone Scn, Ur NEGATIVE NEGATIVE   Fentanyl  NEGATIVE NEGATIVE  Surgical PCR screen     Status: None   Collection Time: 08/31/24 11:22 PM   Specimen: Nasal Mucosa; Nasal Swab  Result Value Ref Range   MRSA, PCR NEGATIVE NEGATIVE   Staphylococcus aureus NEGATIVE NEGATIVE     Imaging or Labs ordered: None  Medical history and chart was reviewed and case discussed with medical provider.  Assessment/Plan: 68 year old male with a displaced left femoral neck fracture  Due to the unstable nature of his injury in his age and activity level I recommend proceeding with a left total hip arthroplasty.  Risks and benefits were discussed with the patient.  Risks include but not limited to bleeding, infection, hip dislocation, periprosthetic fracture, leg length discrepancy, DVT, even the possibility anesthetic complications.  He agreed to proceed with surgery and consent was obtained.  Surgery w/ risks or Emergency surgery: High complexity Risk (Level 5)  Michael MYRTIS Light, MD [facility] [phone] (office) [email or web address]       [1]  Allergies Allergen Reactions   Sertraline Rash  [2]  No current facility-administered medications on file prior to encounter.   Current Outpatient Medications on File Prior to Encounter  Medication Sig Dispense Refill   acetaminophen  (TYLENOL ) 500 MG tablet Take 1,000 mg by mouth every 6 (six) hours as needed for headache or mild pain (pain score 1-3).     carboxymethylcellulose (REFRESH PLUS) 0.5 % SOLN Place 2 drops into both eyes daily as needed (dry eyes).

## 2024-09-01 NOTE — Anesthesia Preprocedure Evaluation (Incomplete)
"                                    Anesthesia Evaluation    Reviewed: Allergy & Precautions, Patient's Chart, lab work & pertinent test results  Airway        Dental   Pulmonary neg pulmonary ROS, Current Smoker          Cardiovascular negative cardio ROS      Neuro/Psych negative neurological ROS     GI/Hepatic negative GI ROS, Neg liver ROS,,,(+) Hepatitis -  Endo/Other  negative endocrine ROS    Renal/GU negative Renal ROS     Musculoskeletal negative musculoskeletal ROS (+)    Abdominal   Peds  Hematology negative hematology ROS (+)   Anesthesia Other Findings   Reproductive/Obstetrics                              Anesthesia Physical Anesthesia Plan Anesthesia Quick Evaluation  "

## 2024-09-01 NOTE — Progress Notes (Signed)
 "  HD#2 SUBJECTIVE:  Patient Summary: Michael Patton is a 68 y.o. 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 He is now postoperative day 0 status post left total hip arthroplasty.  Overnight Events and Interim History: Underwent left total hip arthroplasty today for displaced femoral neck fracture. No immediate perioperative complications reported. Patient was asleep at the time of evaluation and was unable to be aroused for interview.   OBJECTIVE:  Vital Signs: Vitals:   09/01/24 1100 09/01/24 1115 09/01/24 1130 09/01/24 1155  BP: 114/69 123/62 112/64 129/82  Pulse: 87 91 86 95  Resp: 17 16 19 17   Temp:   98.5 F (36.9 C)   TempSrc:      SpO2: 94% 96% 94% 97%  Weight:      Height:       Supplemental O2: Room Air SpO2: 97 % O2 Flow Rate (L/min): 2 L/min  Filed Weights   08/30/24 1722  Weight: 61 kg     Intake/Output Summary (Last 24 hours) at 09/01/2024 1329 Last data filed at 09/01/2024 1017 Gross per 24 hour  Intake 1790 ml  Output 975 ml  Net 815 ml   Net IO Since Admission: 1,815.11 mL [09/01/24 1329]  Physical Exam Constitutional:      Appearance: Normal appearance.  HENT:     Head: Normocephalic and atraumatic.  Cardiovascular:     Rate and Rhythm: Normal rate and regular rhythm.     Pulses: Normal pulses.     Heart sounds: Normal heart sounds.  Pulmonary:     Effort: Pulmonary effort is normal.     Breath sounds: Normal breath sounds.  Abdominal:     General: Abdomen is flat. Bowel sounds are normal.     Palpations: Abdomen is soft.  Skin:    General: Skin is warm and dry.     Capillary Refill: Capillary refill takes less than 2 seconds.     Patient Lines/Drains/Airways Status     Active Line/Drains/Airways     Name Placement date  Placement time Site Days   Peripheral IV 08/31/24 20 G Anterior;Left Forearm 08/31/24  1344  Forearm  1   Peripheral IV 09/01/24 18 G Left Hand 09/01/24  0751  Hand  less than 1   Wound 09/01/24 0949 Surgical Closed Surgical Incision Hip Left 09/01/24  0949  Hip  less than 1            Pertinent Imaging: DG C-Arm 1-60 Min-No Report Result Date: 09/01/2024 Fluoroscopy was utilized by the requesting physician.  No radiographic interpretation.   DG C-Arm 1-60 Min-No Report Result Date: 09/01/2024 Fluoroscopy was utilized by the requesting physician.  No radiographic interpretation.    ASSESSMENT/PLAN:  Assessment: Principal Problem:   Fall Active Problems:   Tobacco abuse   COPD (chronic obstructive pulmonary disease) (HCC)   Plan: #Left Femoral Neck Fracture s/p Left Total Hip Arthroplasty (POD 0) #Fall Patient underwent successful left total hip arthroplasty today for displaced femoral neck fracture. Postoperative exam is reassuring without signs of surgical site complication. - Orthopedic surgery consulted, appreciate recommendations - No hip precautions per ortho - Weight bearing as tolerated to left lower extremity - Postoperative antibiotics: Ancef  per orthopedic protocol - DVT prophylaxis: Aspirin  81 mg daily - Pain control: Acetaminophen  500mg  QID scheduled, IM Dilauded 0.5-1mg  q4hrs PRN for  breakthrough pain - Bowel Regimen: Colace 100mg  BID - PT/OT consult - Monitor surgical site for signs of infection or hematoma  #AUD CIWA range in last 24 hours 13 - 4, last CIWA 4. 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 While the patient denies prior withdrawal history, his degree of use places him at high risk for withdrawal.  - CIWA protocol with lorazepam  PRN - Thiamine  100 mg daily - Folate 1 mg  daily - Multivitamin with minerals 1 tablet daily - Monitor closely for withdrawal symptoms  #Hyponatremia Na 131, 127 on admission, likely acute on chronic hyponatremia in the setting of chronic alcohol use. Patient is asymptomatic and neurologically intact - Continue fluid restriction - Monitor BMP  #Cirrhotic Hepatic Morphology #Trace RUQ Ascites #Chronic Untreated Hepatitis C Imaging demonstrates cirrhotic hepatic morphology without focal lesions and trace right upper quadrant ascites. Likely secondary to chronic alcohol use and untreated hepatitis C. MELD score approximately 8. No current evidence of decompensation (no encephalopathy, variceal bleeding, SBP, or significant ascites). Labs notable for AST > ALT, consistent with alcohol-related liver injury. - Monitor for signs of decompensation (ascites, encephalopathy, infection) - No indication for SBP prophylaxis at this time  #Chronic Medical Conditions COPD: Monitor respiratory status Tobacco Use Disorder: Continue Nicotine  patch 21 mg daily  Best Practice: Diet: Regular diet and with fluid restriction IVF: Fluids: None VTE: SCDs Start: 09/01/24 1308 Place TED hose Start: 09/01/24 1308 Code: Full AB: N/A Pain Medicine: acetaminophen  500mg  QID, Dilaudid  Q4 PRN for mod pain, IV dilaudid  Q8H for severe pain, lidocaine  5% patch q24 hrs Bowel Regimen: N/A Therapy Recs: Pending, DME: none Family Contact:  Contact Information     Name Relation Home Work [place] Brother   [phone]      Other Contacts     Name Relation Home Work Mobile   [place] Brother [phone]     Mardy Alm Rode   [phone]       DISPO: Anticipated discharge in 3-5 days to Rehab pending surgical procedure.  Signature: Alan Maiden, MD Psychiatry Resident, PGY-1 [facility]  Pager: # [phone]. 1:29 PM, 09/01/2024   "

## 2024-09-02 LAB — BASIC METABOLIC PANEL WITH GFR
Anion gap: 8 (ref 5–15)
BUN: 16 mg/dL (ref 8–23)
CO2: 23 mmol/L (ref 22–32)
Calcium: 8.1 mg/dL — ABNORMAL LOW (ref 8.9–10.3)
Chloride: 101 mmol/L (ref 98–111)
Creatinine, Ser: 0.51 mg/dL — ABNORMAL LOW (ref 0.61–1.24)
GFR, Estimated: >60 mL/min
Glucose, Bld: 103 mg/dL — ABNORMAL HIGH (ref 70–99)
Potassium: 3.8 mmol/L (ref 3.5–5.1)
Sodium: 133 mmol/L — ABNORMAL LOW (ref 135–145)

## 2024-09-02 LAB — CBC
HCT: 29.9 % — ABNORMAL LOW (ref 39.0–52.0)
Hemoglobin: 10.8 g/dL — ABNORMAL LOW (ref 13.0–17.0)
MCH: 36.1 pg — ABNORMAL HIGH (ref 26.0–34.0)
MCHC: 36.1 g/dL — ABNORMAL HIGH (ref 30.0–36.0)
MCV: 100 fL (ref 80.0–100.0)
Platelets: 69 K/uL — ABNORMAL LOW (ref 150–400)
RBC: 2.99 MIL/uL — ABNORMAL LOW (ref 4.22–5.81)
RDW: 13.2 % (ref 11.5–15.5)
WBC: 8.5 K/uL (ref 4.0–10.5)
nRBC: 0 % (ref 0.0–0.2)

## 2024-09-02 MED ORDER — POLYETHYLENE GLYCOL 3350 17 G PO PACK
17.0000 g | PACK | Freq: Two times a day (BID) | ORAL | Status: DC
  Administered 2024-09-02 – 2024-09-05 (×7): 17 g via ORAL
  Filled 2024-09-02 (×4): qty 1

## 2024-09-02 MED ORDER — BISACODYL 10 MG RE SUPP: 10.0000 mg | Freq: Every day | RECTAL | Status: DC | PRN

## 2024-09-02 MED ORDER — VITAMIN D (ERGOCALCIFEROL) 1.25 MG (50000 UNIT) PO CAPS
50000.0000 [IU] | ORAL_CAPSULE | ORAL | Status: DC
  Administered 2024-09-02: 50000 [IU] via ORAL
  Filled 2024-09-02 (×2): qty 1

## 2024-09-02 MED ORDER — SENNA 8.6 MG PO TABS
1.0000 | ORAL_TABLET | Freq: Two times a day (BID) | ORAL | Status: DC
  Administered 2024-09-02 – 2024-09-08 (×9): 8.6 mg via ORAL
  Filled 2024-09-02 (×4): qty 1

## 2024-09-02 NOTE — Evaluation (Signed)
 Occupational Therapy Evaluation Patient Details Name: Michael Patton MRN: [id number] DOB: [birth year] Today's Date: 09/02/2024   History of Present Illness   68 y.o. male presented to ED 08/30/24 with left femoral neck fracture from a fall.  Underwant L THA on 09/01/24.   PMH significant for but ot limited to: chronic hepatitis C untreated, COPD, chronic alcohol abuse.     Clinical Impressions Michael Patton was evaluated s/p the above admission list. He is indep and lives with 5 roommates at baseline. Upon evaluation the pt was limited by impaired cognition, weakness, poor balance, LLE pain, limited activity tolerance and poor awareness. Overall he needed max A for stand and pivot with a RW, he required significant cues for body mechanics and safety - little to no carryover noted. Due to the deficits listed below the pt also needs total A for LB ADLs and min A for UB ADLs. Pt will benefit from continued acute OT services and skilled inpatient follow up therapy, <3 hours/day with hopes that he may progress to home with continued acute therapies. Current recommendation is based on significant assist levels and impaired cognition on OT evaluation.      If plan is discharge home, recommend the following:   A lot of help with walking and/or transfers;A lot of help with bathing/dressing/bathroom;Assistance with cooking/housework;Assist for transportation;Help with stairs or ramp for entrance;Supervision due to cognitive status     Functional Status Assessment   Patient has had a recent decline in their functional status and demonstrates the ability to make significant improvements in function in a reasonable and predictable amount of time.     Equipment Recommendations   Other (comment);BSC/3in1 (RW)      Precautions/Restrictions   Precautions Precautions: Fall Recall of Precautions/Restrictions: Impaired Precaution/Restrictions Comments: ciwa Restrictions Weight Bearing Restrictions  Per Provider Order: Yes LLE Weight Bearing Per Provider Order: Weight bearing as tolerated     Mobility Bed Mobility Overal bed mobility: Needs Assistance Bed Mobility: Sit to Supine       Sit to supine: Mod assist   General bed mobility comments: mod A to assist LEs back into bed    Transfers Overall transfer level: Needs assistance Equipment used: Rolling walker (2 wheels) Transfers: Sit to/from Stand, Bed to chair/wheelchair/BSC Sit to Stand: Max assist Stand pivot transfers: Max assist         General transfer comment: maximal cues and assist. Pt was unable to follow simple commands for body mechanics      Balance Overall balance assessment: Needs assistance Sitting-balance support: No upper extremity supported, Feet supported Sitting balance-Leahy Scale: Fair Sitting balance - Comments: close CGA   Standing balance support: Bilateral upper extremity supported, Reliant on assistive device for balance Standing balance-Leahy Scale: Poor Standing balance comment: relies on RW                           ADL either performed or assessed with clinical judgement   ADL Overall ADL's : Needs assistance/impaired Eating/Feeding: Set up   Grooming: Minimal assistance   Upper Body Bathing: Minimal assistance   Lower Body Bathing: Total assistance   Upper Body Dressing : Minimal assistance   Lower Body Dressing: Total assistance   Toilet Transfer: Maximal assistance;Stand-pivot;Rolling walker (2 wheels)   Toileting- Clothing Manipulation and Hygiene: Total assistance       Functional mobility during ADLs: Maximal assistance General ADL Comments: significant cues needed for all initiation, sequencing and processing. Pt had very  poor awareness. total A needed for all LB ADLs due to pain, balance and cognition     Vision Baseline Vision/History: 1 Wears glasses Vision Assessment?: No apparent visual deficits     Perception Perception: Not tested        Praxis Praxis: Not tested       Pertinent Vitals/Pain Pain Assessment Pain Assessment: Faces Pain Score: 9  Faces Pain Scale: Hurts little more Pain Location: L hip area Pain Descriptors / Indicators: Constant Pain Intervention(s): Monitored during session, Limited activity within patient's tolerance     Extremity/Trunk Assessment Upper Extremity Assessment Upper Extremity Assessment: Generalized weakness   Lower Extremity Assessment Lower Extremity Assessment: LLE deficits/detail LLE Deficits / Details: significant weakness in hip and knee muscles, liekly due to pain and recent surgery   Cervical / Trunk Assessment Cervical / Trunk Assessment: Kyphotic   Communication Communication Communication: No apparent difficulties   Cognition Arousal: Alert Behavior During Therapy: Impulsive Cognition: Cognition impaired   Orientation impairments: Time, Situation Awareness: Online awareness impaired Memory impairment (select all impairments): Working civil service fast streamer, Copywriter, advertising Attention impairment (select first level of impairment): Focused attention Executive functioning impairment (select all impairments): Initiation, Organization, Sequencing, Reasoning, Problem solving OT - Cognition Comments: pt follows 1 step commands ~50% of the time, had significant difficulty sequencing standing from the chair (he was attempted to hold to bottom of the RW) and needed maximal cues for initiation and sequencing all simple tasks. At the end of the session pt was asked how difficult the transfer was he said, it was no problem - poor insight/awareness                 Following commands: Impaired Following commands impaired: Follows one step commands inconsistently     Cueing  General Comments   Cueing Techniques: Verbal cues  VSS   Exercises     Shoulder Instructions      Home Living Family/patient expects to be discharged to:: Other (Comment)                                  Additional Comments: Rents a room at a house with 4 other people      Prior Functioning/Environment Prior Level of Function : Independent/Modified Independent                    OT Problem List: Decreased strength;Decreased range of motion;Decreased activity tolerance;Impaired balance (sitting and/or standing);Decreased cognition;Decreased safety awareness;Decreased knowledge of precautions;Decreased knowledge of use of DME or AE;Pain   OT Treatment/Interventions: Self-care/ADL training;Therapeutic exercise;DME and/or AE instruction;Therapeutic activities;Patient/family education;Balance training      OT Goals(Current goals can be found in the care plan section)   Acute Rehab OT Goals Patient Stated Goal: to lay down OT Goal Formulation: With patient Time For Goal Achievement: 09/16/24 Potential to Achieve Goals: Fair ADL Goals Pt Will Perform Grooming: with supervision;standing Pt Will Perform Lower Body Dressing: with min assist;sit to/from stand Pt Will Transfer to Toilet: with min assist;ambulating;bedside commode Additional ADL Goal #1: Pt will follow 1 step commands 100% of the time   OT Frequency:  Min 2X/week    Co-evaluation              AM-PAC OT 6 Clicks Daily Activity     Outcome Measure Help from another person eating meals?: A Little Help from another person taking care of personal grooming?: A Little Help from another  person toileting, which includes using toliet, bedpan, or urinal?: A Lot Help from another person bathing (including washing, rinsing, drying)?: Total Help from another person to put on and taking off regular upper body clothing?: A Little Help from another person to put on and taking off regular lower body clothing?: Total 6 Click Score: 13   End of Session Equipment Utilized During Treatment: Rolling walker (2 wheels);Gait belt Nurse Communication: Mobility status  Activity Tolerance: Patient  tolerated treatment well Patient left: in bed;with call bell/phone within reach;with bed alarm set  OT Visit Diagnosis: Unsteadiness on feet (R26.81);Other abnormalities of gait and mobility (R26.89);Muscle weakness (generalized) (M62.81);History of falling (Z91.81)                Time: 8755-8698 OT Time Calculation (min): 17 min Charges:  OT General Charges $OT Visit: 1 Visit OT Evaluation $OT Eval Moderate Complexity: 1 Mod  Michael Patton, OTR/L Acute Rehabilitation Services Office [phone] Secure Chat Communication Preferred   Michael Patton 09/02/2024, 2:32 PM

## 2024-09-02 NOTE — Evaluation (Signed)
 Physical Therapy Evaluation Patient Details Name: Michael Patton MRN: [id number] DOB: Dec 10, 1955 Today's Date: 09/02/2024  History of Present Illness  68 y.o. male presented to ED 08/30/24 with left femoral neck fracture from a fall.  Underwant L THA on 09/01/24.   PMH significant for but ot limited to: chronic hepatitis C untreated, COPD, chronic alcohol abuse.  Clinical Impression  Patient is s/p above surgery resulting in functional limitations due to the deficits listed below (see PT Problem List).  Patient will benefit from acute skilled PT to increase his independence and safety with mobility. If pt progresses well over the next several days he may be safe to DC to previous living situation, however if progress is slower than anticipated or if DC is very soon he may need post acute care in an inpatient setting.        If plan is discharge home, recommend the following: A little help with walking and/or transfers;Assistance with cooking/housework;Help with stairs or ramp for entrance   Can travel by private vehicle        Equipment Recommendations Rolling walker (2 wheels)  Recommendations for Other Services  OT consult    Functional Status Assessment Patient has had a recent decline in their functional status and demonstrates the ability to make significant improvements in function in a reasonable and predictable amount of time.     Precautions / Restrictions Precautions Precautions: Fall Recall of Precautions/Restrictions: Intact Restrictions Weight Bearing Restrictions Per Provider Order: Yes LLE Weight Bearing Per Provider Order: Weight bearing as tolerated      Mobility  Bed Mobility Overal bed mobility: Needs Assistance Bed Mobility: Supine to Sit     Supine to sit: Supervision, HOB elevated, Used rails (Took significant time to get to EOB, but no physical assist required)          Transfers Overall transfer level: Needs assistance Equipment used: Rolling  walker (2 wheels) Transfers: Sit to/from Stand Sit to Stand: Min assist           General transfer comment: Cues for safest technique    Ambulation/Gait Ambulation/Gait assistance: Mod assist Gait Distance (Feet): 2 Feet Assistive device: Rolling walker (2 wheels) Gait Pattern/deviations: Decreased stride length, Wide base of support, Shuffle       General Gait Details: pt very shaky on feet  Stairs            Wheelchair Mobility     Tilt Bed    Modified Rankin (Stroke Patients Only)       Balance Overall balance assessment: Needs assistance Sitting-balance support: No upper extremity supported, Feet supported Sitting balance-Leahy Scale: Fair     Standing balance support: Bilateral upper extremity supported, Reliant on assistive device for balance Standing balance-Leahy Scale: Zero Standing balance comment: relies on RW                             Pertinent Vitals/Pain Pain Assessment Pain Assessment: 0-10 Pain Score: 9  Pain Location: L hip area Pain Descriptors / Indicators: Constant Pain Intervention(s): Limited activity within patient's tolerance, Ice applied, Patient requesting pain meds-RN notified    Home Living Family/patient expects to be discharged to:: Other (Comment)                   Additional Comments: Rents a room at a house with 4 other people    Prior Function Prior Level of Function : Independent/Modified Independent  Extremity/Trunk Assessment   Upper Extremity Assessment Upper Extremity Assessment: Defer to OT evaluation    Lower Extremity Assessment Lower Extremity Assessment: LLE deficits/detail LLE Deficits / Details: significant weakness in hip and knee muscles, liekly due to pain and recent surgery    Cervical / Trunk Assessment Cervical / Trunk Assessment: Normal  Communication   Communication Communication: No apparent difficulties    Cognition Arousal:  Alert Behavior During Therapy: WFL for tasks assessed/performed   PT - Cognitive impairments: No apparent impairments                         Following commands: Intact       Cueing Cueing Techniques: Verbal cues     General Comments General comments (skin integrity, edema, etc.): No family present. Pt cooperative. Was able to state he needed to call RN to gewt back to bed.    Exercises Total Joint Exercises Ankle Circles/Pumps: AROM, Left, 10 reps, Seated   Assessment/Plan    PT Assessment Patient needs continued PT services  PT Problem List Decreased strength;Decreased activity tolerance;Decreased balance;Decreased mobility;Decreased knowledge of use of DME;Pain       PT Treatment Interventions DME instruction;Gait training;Stair training;Therapeutic activities;Therapeutic exercise;Patient/family education    PT Goals (Current goals can be found in the Care Plan section)  Acute Rehab PT Goals Patient Stated Goal: To return home PT Goal Formulation: With patient Time For Goal Achievement: 09/16/24 Potential to Achieve Goals: Good    Frequency Min 3X/week     Co-evaluation               AM-PAC PT 6 Clicks Mobility  Outcome Measure Help needed turning from your back to your side while in a flat bed without using bedrails?: A Little Help needed moving from lying on your back to sitting on the side of a flat bed without using bedrails?: A Little Help needed moving to and from a bed to a chair (including a wheelchair)?: A Lot Help needed standing up from a chair using your arms (e.g., wheelchair or bedside chair)?: A Lot Help needed to walk in hospital room?: Total   6 Click Score: 11    End of Session Equipment Utilized During Treatment: Gait belt Activity Tolerance: Patient limited by pain Patient left: in chair;with call bell/phone within reach;with chair alarm set;with nursing/sitter in room Nurse Communication: Mobility status;Patient requests  pain meds PT Visit Diagnosis: Unsteadiness on feet (R26.81)    Time: 8872-8848 PT Time Calculation (min) (ACUTE ONLY): 24 min   Charges:   PT Evaluation $PT Eval Moderate Complexity: 1 Mod PT Treatments $Gait Training: 8-22 mins PT General Charges $$ ACUTE PT VISIT: 1 Visit         Oneil RAMAN, PT   Acute Rehabilitation Services  Office [phone] 09/02/2024   [place] Oneil PARAS 09/02/2024, 1:05 PM

## 2024-09-02 NOTE — Progress Notes (Signed)
" ° ° ° °  1 Day Post-Op Procedures (LRB): ARTHROPLASTY, HIP, TOTAL, ANTERIOR APPROACH (Left) Subjective:  Patient reports pain as mild.  Slept okay.  No overnight events.  Mobilize with PT today.  Denies chest pain, ShOB, N/V.  Denies numbness or tingling.  No other complaints at this time.  Objective:   VITALS:   Vitals:   09/01/24 2001 09/02/24 0328 09/02/24 0746 09/02/24 1152  BP: 130/76 (!) 127/59 125/76 99/67  Pulse: (!) 111 93 91 (!) 102  Resp: 16 17 16 18   Temp: 98.7 F (37.1 C) 98.5 F (36.9 C) 98.1 F (36.7 C)   TempSrc: Oral  Oral Oral  SpO2: 91% 94% 96% 95%  Weight:      Height:        Neurologically intact Sensation intact distally Intact pulses distally Dorsiflexion/Plantar flexion intact Incision: dressing C/D/I Compartment soft Wiggles toes appropriately   Lab Results  Component Value Date   WBC 8.5 09/02/2024   HGB 10.8 (L) 09/02/2024   HCT 29.9 (L) 09/02/2024   MCV 100.0 09/02/2024   PLT 69 (L) 09/02/2024   BMET    Component Value Date/Time   NA 133 (L) 09/02/2024 0748   K 3.8 09/02/2024 0748   CL 101 09/02/2024 0748   CO2 23 09/02/2024 0748   GLUCOSE 103 (H) 09/02/2024 0748   BUN 16 09/02/2024 0748   CREATININE 0.51 (L) 09/02/2024 0748   CALCIUM 8.1 (L) 09/02/2024 0748   GFRNONAA >60 09/02/2024 0748    Yesterday's total administered Morphine  Milligram Equivalents: 40  Xray: Stable post-operative imaging  Assessment/Plan: 1 Day Post-Op   Principal Problem:   Fall Active Problems:   Tobacco abuse   COPD (chronic obstructive pulmonary disease) (HCC)   Procedures (LRB): ARTHROPLASTY, HIP, TOTAL, ANTERIOR APPROACH (Left)   Plan: Up with therapy Incentive Spirometry Elevate and Apply ice  Weightbearing: WBAT LLE ROM/Precautions:  None Mobility: Continue with PT/OT Insicional and dressing care: Reinforce dressings PRN Pain management: multimodal  Antibiotics: Periop abx Diet: Advance as tolerated VTE prophylaxis: Aspirin   81mg  BID x 4 weeks Dispo: per medicine, pending PT eval Follow - up plan: follow up in office in 2 weeks with Dr. Kendal Hila CHRISTELLA Mclean 09/02/2024, 12:07 PM   Contact information:   [id number] 7am-5pm epic message Dr. Edna, or call office for patient follow up: [phone] After hours and holidays please check [email or web address] for group call information for [facility]    "

## 2024-09-02 NOTE — Progress Notes (Cosign Needed)
 "  HD#4 SUBJECTIVE:  Patient Summary: Michael Patton is a 68 y.o. 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 He is now postoperative day 0 status post left total hip arthroplasty.  Overnight Events and Interim History: Underwent left total hip arthroplasty yesterday for displaced femoral neck fracture. States tremors have been the same;denies anxiety, agitation, hallucinosis. Had a small BM. Denies fevers, chills, chest pain, SOB.   OBJECTIVE:  Vital Signs: Vitals:   09/02/24 1532 09/03/24 0600 09/03/24 0750 09/03/24 1326  BP: 131/78  131/87 117/74  Pulse: 96  (!) 106 (!) 103  Resp: 16 20 20 19   Temp: 98.3 F (36.8 C)  99.1 F (37.3 C) 97.9 F (36.6 C)  TempSrc: Oral Oral Oral   SpO2: 95% 98% 97% 96%  Weight:      Height:       Supplemental O2: Room Air SpO2: 96 % O2 Flow Rate (L/min):  (2 L/M)  Filed Weights   08/30/24 1722  Weight: 134 lb 7.7 oz (61 kg)    No intake or output data in the 24 hours ending 09/03/24 1536  Net IO Since Admission: 831.33 mL [09/03/24 1536]  Physical Exam Constitutional:      Appearance: Normal appearance.     Comments: Slight tremor  HENT:     Head: Normocephalic and atraumatic.  Cardiovascular:     Rate and Rhythm: Normal rate and regular rhythm.     Pulses: Normal pulses.     Heart sounds: Normal heart sounds.  Pulmonary:     Effort: Pulmonary effort is normal.     Breath sounds: Normal breath sounds.  Abdominal:     General: Abdomen is flat. Bowel sounds are normal.     Palpations: Abdomen is soft.  Musculoskeletal:     Comments: Palmer erythema on hands, telangiectasias throughout neck and chest, decurved toenails   Skin:    General: Skin is warm and dry.     Capillary Refill: Capillary refill takes less than 2  seconds.     Patient Lines/Drains/Airways Status     Active Line/Drains/Airways     Name Placement date Placement time Site Days   Peripheral IV 08/31/24 20 G Anterior;Left Forearm 08/31/24  1344  Forearm  3   Peripheral IV 09/01/24 18 G Left Hand 09/01/24  0751  Hand  2   Wound 09/01/24 0949 Surgical Closed Surgical Incision Hip Left 09/01/24  0949  Hip  2            Pertinent Imaging: DG Pelvis Portable Result Date: 09/03/2024 CLINICAL DATA:  Left hip pain. EXAM: PORTABLE PELVIS 1 VIEW COMPARISON:  None Available. FINDINGS: Bipolar left hip prosthesis is seen in appropriate position. No evidence of fracture or dislocation. The iliac crests are not included in field of view, however, no other pelvic bone abnormality identified. IMPRESSION: Left hip prosthesis in appropriate position. No acute findings. Electronically Signed   By: Norleen DELENA Kil M.D.   On: 09/03/2024 11:28   CT HEAD WO CONTRAST ( ) Result Date: 09/03/2024 EXAM: CT HEAD WITHOUT CONTRAST 09/03/2024 09:05:31 AM TECHNIQUE: CT of the head was performed without the administration of intravenous contrast. Automated exposure control, iterative reconstruction, and/or weight based adjustment of the mA/kV was utilized to reduce the radiation dose to as low as reasonably achievable. COMPARISON: Head CT 08/30/2024.  CLINICAL HISTORY: 68 year old male. Facial trauma, blunt. Fall with left femur fracture. FINDINGS: BRAIN AND VENTRICLES: No acute hemorrhage. No evidence of acute infarct. No hydrocephalus. No extra-axial collection. No mass effect or midline shift. Brain volume stable, lower limits of normal for age. Asymmetric patchy periventricular white matter hypodensity appears stable and mild to moderate for age. Stable faint basal ganglia vascular calcification. No suspicious intracranial vascular hyperdensity. Calcified atherosclerosis at the skull base. ORBITS: No acute abnormality. SINUSES: Trace low density layering fluid and  bubbly opacity now in the maxillary and sphenoid sinuses, appears inflammatory. Tympanic cavities and mastoids are well aerated. SOFT TISSUES AND SKULL: No acute soft tissue abnormality. No skull fracture. IMPRESSION: 1. No acute intracranial abnormality. 2. Stable mild to moderate for age cerebral white matter disease. 3. Minimal new paranasal sinus inflammation. Electronically signed by: Helayne Hurst MD 09/03/2024 09:16 AM EST RP Workstation: [id number]     ASSESSMENT/PLAN:  Assessment: Principal Problem:   Closed left femoral fracture (HCC) Active Problems:   Fall   Acute delirium   Osteoporosis with current pathological fracture   Vitamin D  deficiency   Alcoholic cirrhosis of liver with ascites (HCC)   Chronic hepatitis C (HCC)   Tobacco abuse   Alcohol use disorder, severe, dependence (HCC)   COPD (chronic obstructive pulmonary disease) (HCC)   Plan: #Left Femoral Neck Fracture s/p Left Total Hip Arthroplasty (POD 1) #Fall #Osteoporosis Waiting PT recommendations. Patient underwent successful left total hip arthroplasty yesterday for displaced femoral neck fracture. Postoperative exam is reassuring without signs of surgical site complication. - Orthopedic surgery consulted, appreciate recommendations - No hip precautions per ortho - Start ergocalciferol  1.25mg  q7 days - Weight bearing as tolerated to left lower extremity - Postoperative antibiotics: Ancef  per orthopedic protocol - DVT prophylaxis: Aspirin  81 mg daily - Pain control: Acetaminophen  500mg  QID scheduled, IM Dilauded 0.5-1mg  q4hrs PRN for breakthrough pain - Bowel Regimen: Colace 100mg  BID - PT/OT consult - Monitor surgical site for signs of infection or hematoma  #AUD Last CIWA 2. 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 No prior withdrawal  history. Discontinued ativan  protocol today, day 4.  - Discontinued Ativan  taper, linked the following medications:   - Robaxin  500 mg q6hrs PRN for msucle spasms   - Reglan  5-10mg  q8 hrs, zofran  4 mg q6hrs PRN for nausea - Thiamine  100 mg daily - Folate 1 mg daily - Multivitamin with minerals 1 tablet daily - Monitor closely for withdrawal symptoms  #Hyponatremia Na 133, 127 on admission, likely acute on chronic hyponatremia in the setting of chronic alcohol use. Patient is asymptomatic and neurologically intact - Continue fluid restriction - Monitor BMP  #Cirrhotic Hepatic Morphology #Trace RUQ Ascites #Chronic Untreated Hepatitis C Imaging demonstrates cirrhotic hepatic morphology without focal lesions and trace right upper quadrant ascites. Likely secondary to chronic alcohol use and untreated hepatitis C. MELD score approximately 8. No current evidence of decompensation (no encephalopathy, variceal bleeding, SBP, or significant ascites). Labs notable for AST > ALT, consistent with alcohol-related liver injury. - Monitor for signs of decompensation (ascites, encephalopathy, infection) - No indication for SBP prophylaxis at this time  #Chronic Medical Conditions COPD: Monitor respiratory status Tobacco Use Disorder: Continue Nicotine  patch 21 mg daily  Best Practice: Diet: Regular diet and with fluid restriction IVF: Fluids: None VTE: SCDs Start: 09/01/24 1308 Place TED hose Start: 09/01/24  1308 Code: Full AB: N/A Pain Medicine: acetaminophen  500mg  QID, Dilaudid  Q4 PRN for mod pain, IV dilaudid  Q8H for severe pain, lidocaine  5% patch q24 hrs Bowel Regimen: N/A Therapy Recs: Pending, DME: none Family Contact:  Contact Information     Name Relation Home Work [place] Brother   [phone]      Other Contacts     Name Relation Home Work Mobile   [place] Brother [phone]     Mardy Alm Rode   [phone]       DISPO: Anticipated discharge  in 3-5 days to Rehab pending surgical procedure.  Signature: Alan Maiden, MD Psychiatry Resident, PGY-1 [facility]  Pager: # [phone]. 3:36 PM, 09/03/2024   "

## 2024-09-03 ENCOUNTER — Inpatient Hospital Stay (HOSPITAL_COMMUNITY)

## 2024-09-03 ENCOUNTER — Encounter (HOSPITAL_COMMUNITY): Payer: Self-pay

## 2024-09-03 DIAGNOSIS — E559 Vitamin D deficiency, unspecified: Secondary | ICD-10-CM | POA: Diagnosis present

## 2024-09-03 DIAGNOSIS — S7292XA Unspecified fracture of left femur, initial encounter for closed fracture: Secondary | ICD-10-CM | POA: Diagnosis present

## 2024-09-03 DIAGNOSIS — M8000XA Age-related osteoporosis with current pathological fracture, unspecified site, initial encounter for fracture: Secondary | ICD-10-CM | POA: Diagnosis present

## 2024-09-03 DIAGNOSIS — R410 Disorientation, unspecified: Secondary | ICD-10-CM | POA: Diagnosis not present

## 2024-09-03 LAB — CBC
HCT: 30.7 % — ABNORMAL LOW (ref 39.0–52.0)
Hemoglobin: 11 g/dL — ABNORMAL LOW (ref 13.0–17.0)
MCH: 36.3 pg — ABNORMAL HIGH (ref 26.0–34.0)
MCHC: 35.8 g/dL (ref 30.0–36.0)
MCV: 101.3 fL — ABNORMAL HIGH (ref 80.0–100.0)
Platelets: 88 K/uL — ABNORMAL LOW (ref 150–400)
RBC: 3.03 MIL/uL — ABNORMAL LOW (ref 4.22–5.81)
RDW: 13.2 % (ref 11.5–15.5)
WBC: 8.7 K/uL (ref 4.0–10.5)
nRBC: 0 % (ref 0.0–0.2)

## 2024-09-03 LAB — BASIC METABOLIC PANEL WITH GFR
Anion gap: 10 (ref 5–15)
BUN: 18 mg/dL (ref 8–23)
CO2: 24 mmol/L (ref 22–32)
Calcium: 8.3 mg/dL — ABNORMAL LOW (ref 8.9–10.3)
Chloride: 103 mmol/L (ref 98–111)
Creatinine, Ser: 0.55 mg/dL — ABNORMAL LOW (ref 0.61–1.24)
GFR, Estimated: >60 mL/min
Glucose, Bld: 113 mg/dL — ABNORMAL HIGH (ref 70–99)
Potassium: 3.4 mmol/L — ABNORMAL LOW (ref 3.5–5.1)
Sodium: 137 mmol/L (ref 135–145)

## 2024-09-03 MED ORDER — POTASSIUM CHLORIDE CRYS ER 20 MEQ PO TBCR
40.0000 meq | EXTENDED_RELEASE_TABLET | Freq: Once | ORAL | Status: AC
  Administered 2024-09-03: 40 meq via ORAL
  Filled 2024-09-03: qty 2

## 2024-09-03 NOTE — TOC CAGE-AID Note (Signed)
 Transition of Care [facility]) - CAGE-AID Screening   Patient Details  Name: Michael Patton MRN: [id number] Date of Birth: 12/03/1955  [facility]) CM/SW Contact:    LEBRON ROCKIE ORN, RN Phone Number: [phone] 09/03/2024, 6:17 PM   Clinical Narrative: Pt currently in the hospital post fall and hip fx. Pt denies drug use but is a significant alcohol drinker. CIWA has been ordered as well as ativan .  AVS to print resources for pt upon discharge.    CAGE-AID Screening:    Have You Ever Felt You Ought to Cut Down on Your Drinking or Drug Use?: Yes Have People Annoyed You By Critizing Your Drinking Or Drug Use?: Yes Have You Felt Bad Or Guilty About Your Drinking Or Drug Use?: Yes Have You Ever Had a Drink or Used Drugs First Thing In The Morning to Steady Your Nerves or to Get Rid of a Hangover?: Yes CAGE-AID Score: 4  Substance Abuse Education Offered: Yes

## 2024-09-03 NOTE — Progress Notes (Addendum)
 Orthopaedic Progress Note  S: The patient is off the floor.  According to the notes, he did have a fall overnight.  Apparently he hit his head.  He was noted to be hypertensive and tachycardic.  He was noted to complain of headaches.  A CT scan is ordered  O:  Vitals:   09/03/24 0600 09/03/24 0750  BP:  131/87  Pulse:  (!) 106  Resp: 20 20  Temp:  99.1 F (37.3 C)  SpO2: 98% 97%    Clinical exam the patient  Imaging: CT scan is pending.  Also get x-rays of the hip.  Labs:  Results for orders placed or performed during the hospital encounter of 08/30/24 (from the past 24 hours)  CBC     Status: Abnormal   Collection Time: 09/03/24  7:54 AM  Result Value Ref Range   WBC 8.7 4.0 - 10.5 K/uL   RBC 3.03 (L) 4.22 - 5.81 MIL/uL   Hemoglobin 11.0 (L) 13.0 - 17.0 g/dL   HCT 69.2 (L) 60.9 - 47.9 %   MCV 101.3 (H) 80.0 - 100.0 fL   MCH 36.3 (H) 26.0 - 34.0 pg   MCHC 35.8 30.0 - 36.0 g/dL   RDW 86.7 88.4 - 84.4 %   Platelets 88 (L) 150 - 400 K/uL   nRBC 0.0 0.0 - 0.2 %  Basic metabolic panel with GFR     Status: Abnormal   Collection Time: 09/03/24  7:54 AM  Result Value Ref Range   Sodium 137 135 - 145 mmol/L   Potassium 3.4 (L) 3.5 - 5.1 mmol/L   Chloride 103 98 - 111 mmol/L   CO2 24 22 - 32 mmol/L   Glucose, Bld 113 (H) 70 - 99 mg/dL   BUN 18 8 - 23 mg/dL   Creatinine, Ser 9.44 (L) 0.61 - 1.24 mg/dL   Calcium 8.3 (L) 8.9 - 10.3 mg/dL   GFR, Estimated >39 >39 mL/min   Anion gap 10 5 - 15    Assessment: I am currently unable to evaluate the patient as he is on the floor getting a CAT scan.  I do have to go to [place] with further surgeries today but will attempt to return and see the patient later on today.  In the meantime, we will defer to medicine for further evaluation of his headaches and fall.  I will also order x-rays of his hip to make sure is no concerns for dislocation.   AddendumL  x-rays show no dislocation or acute injury  2nd Addendum: Is able to return and see  the patient later in the day.  He reports no increased pain in his hip after his fall.  The dressing is clean.  Thigh is soft.  No calf tenderness.  Intact sensation in the saphenous, sural, tibial, peroneal nerve distributions.  5/5 strength EHL, FHL, gastrocs, tibialis anterior.  This point he continue to weight-bear as tolerated.  He continue with physical therapy.  Will continue to follow the patient.  Cordella Rhein, MD, MS [facility] Specialist / [place] [phone]

## 2024-09-03 NOTE — Congregational Nurse Program (Signed)
 0500 Patient was found on floor this am, Patient alert and oriented 2-3 patient had made his way across the room next to sofa Vitals taking  running tacky MD notified came to floor to examine patient, orders pending CT scan of head.Patient resting in bed at present time has made attempts to exit bed x2, bed alarm on and patient door remains open encouraged patient to not exit bed.

## 2024-09-03 NOTE — Progress Notes (Signed)
 "  HD#4 SUBJECTIVE:  Patient Summary: Michael Patton is a 68 y.o. with a pertinent PMH of alcohol use disorder, recurrent falls, COPD, untreated hepatitis C, suspected alcohol related cirrhosis, reported seizure history, who presented with mechanical fall resulting in displaced left femoral neck fracture.  He is now postoperative day 2 status post left total hip arthroplasty with stay complicated by suspected hospital delirium.  Overnight Events: Around 5:30 AM, it was reported that patient had a fall and hit his head.  Thought to be 2/2 pain medication versus hospital-acquired delirium.  Stat CT head ordered.  Holding opioids.  Delirium precautions ordered.  Interim History: He says he feels fine this morning but remembers his fall this morning and states that he could be better. He is Aox3. He is aware that he got a head scan this morning and the purpose was to evaluate after his fall. He states he is moving better and only reports left lateral jaw pain from when he fell. His increase in hip pain from the fall is only minimal. No CP, SOB, abdominal pain. Passing gas and urinating, no BM yet today.  OBJECTIVE:  Vital Signs: Vitals:   09/02/24 0746 09/02/24 1152 09/02/24 1532 09/03/24 0600  BP: 125/76 99/67 131/78   Pulse: 91 (!) 102 96   Resp: 16 18 16 20   Temp: 98.1 F (36.7 C)  98.3 F (36.8 C)   TempSrc: Oral Oral Oral Oral  SpO2: 96% 95% 95% 98%  Weight:      Height:       Supplemental O2: Room Air SpO2: 98 % O2 Flow Rate (L/min):  (2 L/M)  Filed Weights   08/30/24 1722  Weight: 61 kg    No intake or output data in the 24 hours ending 09/03/24 0649 Net IO Since Admission: 831.33 mL [09/03/24 0649]  Physical Exam: Physical Exam Constitutional:      General: He is not in acute distress.    Appearance: He is not ill-appearing, toxic-appearing or diaphoretic.  Cardiovascular:     Rate and Rhythm: Normal rate and regular rhythm.     Pulses: Normal pulses.     Heart  sounds: Normal heart sounds. No murmur heard.    No friction rub. No gallop.  Pulmonary:     Effort: Pulmonary effort is normal.     Breath sounds: Normal breath sounds. No stridor. No wheezing, rhonchi or rales.  Abdominal:     Palpations: Abdomen is soft.     Tenderness: There is no abdominal tenderness.  Musculoskeletal:     Right lower leg: No edema.     Left lower leg: No edema.     Comments: Moving all extremities.  Sensation intact  Skin:    General: Skin is warm and dry.     Findings: Bruising (Present on the left forehead, diffusely over upper extremities, left hip bruising.  Nontender to palpation) present.  Neurological:     General: No focal deficit present.     Mental Status: He is alert and oriented to person, place, and time. Mental status is at baseline.     Patient Lines/Drains/Airways Status     Active Line/Drains/Airways     Name Placement date Placement time Site Days   Peripheral IV 08/31/24 20 G Anterior;Left Forearm 08/31/24  1344  Forearm  3   Peripheral IV 09/01/24 18 G Left Hand 09/01/24  0751  Hand  2   Wound 09/01/24 0949 Surgical Closed Surgical Incision Hip Left 09/01/24  0949  Hip  2            Pertinent labs and imaging:      Latest Ref Rng & Units 09/02/2024    7:48 AM 09/01/2024    6:28 AM 08/31/2024    5:27 AM  CBC  WBC 4.0 - 10.5 K/uL 8.5  8.7  6.5   Hemoglobin 13.0 - 17.0 g/dL 89.1  86.2  86.3   Hematocrit 39.0 - 52.0 % 29.9  37.2  37.0   Platelets 150 - 400 K/uL 69  72  73        Latest Ref Rng & Units 09/02/2024    7:48 AM 09/01/2024    6:28 AM 08/31/2024    5:27 AM  CMP  Glucose 70 - 99 mg/dL 896  889  77   BUN 8 - 23 mg/dL 16  13  9    Creatinine 0.61 - 1.24 mg/dL 9.48  9.42  9.54   Sodium 135 - 145 mmol/L 133  131  131   Potassium 3.5 - 5.1 mmol/L 3.8  4.0  4.3   Chloride 98 - 111 mmol/L 101  98  98   CO2 22 - 32 mmol/L 23  21  21    Calcium 8.9 - 10.3 mg/dL 8.1  8.5  8.0   Total Protein 6.5 - 8.1 g/dL   7.6    Total Bilirubin 0.0 - 1.2 mg/dL   1.7   Alkaline Phos 38 - 126 U/L   92   AST 15 - 41 U/L   85   ALT 0 - 44 U/L   40     No results found.  ASSESSMENT/PLAN:  Assessment: Principal Problem:   Fall Active Problems:   Tobacco abuse   COPD (chronic obstructive pulmonary disease) (HCC)   Plan: #Left femoral neck fracture s/p left total hip arthroplasty #Osteoporosis Surgery 12/26.  Postop day 2.  Physical exam remarkable for significant bruising in upper extremities, patient is nontender to palpation of left hip and there are no focal deficits after fall. PT recommendations: Acute skilled PT to increase independence and safety with mobility.  If patient progresses well over the next several days, may be safe to discharge previous living situation however, progress is slower than anticipated he may need acute care in inpatient setting.  OT recommendations: Skilled inpatient follow-up therapy less than 3 hours/day. - Weightbearing as tolerated to left lower extremity - DVT prophylaxis: Aspirin  81 mg twice daily - See below for pain control - Bowel regimen: MiraLAX , senna twice daily, bisacodyl  daily as needed - Ergocalciferol  1.25 mg q. 7 days - Incentive spirometry - Elevate and apply cold therapy. - Follow-up in 2 weeks with Dr. Kendal  # Fall in hospital # Encephalopathy - resolved # Suspected hospital-acquired delirium Patient had a fall 12/28 AM that resulted in quarter sized bruise on left forehead, bleeding on right hand.  Surgical site after fall was noted to be clean, dry, intact without other bruising or injuries observed.  Physical exam today, significant bruising present along the lateral hip, however was nontender to palpation.  No focal deficits noted.  Denies headache, endorses slight facial jaw pain on left side. Immediately after fall, he was oriented only to name, but during examination today he was alert and oriented x 3 and appeared back at baseline. CT head  unremarkable for acute intracranial abnormality.  Does not appear that he received medication management with Dilaudid  or morphine  12/17.  More likely that he has hospital-acquired delirium  not driven by medication management.  Spoke with patient about holding Dilaudid  and morphine  given his pain is adequately controlled currently without, but encourage patient to reach out if he had any worsening pain. - Delirium precautions ordered - Holding opioids for now - Pain control: Tylenol  500 mg 4 times daily, Tylenol  650 mg every 6 hours as needed, lidocaine  patch - Follow-up x-ray hip per orthopedic surgery  # AUD Last CIWA reported 3 around 1500 12/27.  Has history of alcohol use includes daily consumption of 6-7 beers for the past 4 years.  Complicated by alcohol related seizures, cirrhosis, acute intoxication on admission with ethanol level 307.  Ativan  protocol discontinued 12/27.  Encephalopathy/fall unlikely due to decompensated cirrhosis, more likely due to hospital-acquired delirium as stated above. - Thiamine  100 mg daily - Folate 1 mg daily - Multivitamin 1 tablet daily - Reglan  5-10 mg every 8 hours - Zofran  4 mg every 8 hours as needed - Robaxin  500 mg every 6 hours as needed for muscle spasms  #Cirrhotic hepatic morphology #Trace RUQ ascites #Chronic untreated hep C Imaging demonstrated cirrhotic hepatic morphology without focal lesions and trace right upper quadrant ascites.  Likely 2/2 chronic alcohol use and untreated hep C.  MELD score 8.  No evidence of variceal bleeding, SBP, significant ascites.  Labs notable for AST > ALT consistent with alcohol related liver injury. - Stable  #Hyponatremia Resolved. Sodium 137 up from 133.  Hyponatremia in setting of chronic hyponatremia 2/2 alcohol use.  #Chronic medical conditions COPD - Stable Tobacco use disorder - Nicotine  patch 21 mg daily  Best Practice: Diet: Regular diet IVF: Fluids: None, Rate: None VTE: SCDs Start:  09/01/24 1308 Place TED hose Start: 09/01/24 1308 ASA 81 mg BID Code: Full  Disposition planning: Therapy Recs: Pending, DME: other PT/OT DISPO: Anticipated discharge pendingPT/OT recommendations.  Signature:  Sallyanne Benuel [facility]  6:49 AM, 09/03/2024  On Call pager [phone]  "

## 2024-09-03 NOTE — Progress Notes (Addendum)
 I received a secure message from the night RN regarding [name]. The message reported that the patient had a fall, hit his head, and is now experiencing soreness, bleeding, and bruising on both elbows. He is also hypertensive and tachycardic. The nurse stated that when she went to check on him, she found him on the floor. It was noted that the bed alarm did not go off to notify staff when the patient got out of bed.The circumstances surrounding the fall are unclear, as the patient is unable to provide details other than stating that he was attempting to go home.  Upon examination, the patient was being cleaned and assisted back into bed from the floor. He was oriented to his name but kept repeating his home address when asked if he knew where he was. When asked multiple times where he was, he responded, I guess I am somewhere, but I am not sure. He also mentioned, I don't like all the medications I am being given here. A cardiac exam revealed tachycardia but a normal rhythm. There was tenderness and a quarter-sized bruise on his left forehead. The right hand, which had bruising from admission, was also bleeding. Fortunately, his surgical site dressing was clean, dry, and intact, with no bruising or other injuries observed.He is otherwise in no acute distress.         I suspect the patient is experiencing hospital-acquired delirium. [name] is 68 years old, initially presented after a fall due to alcohol intoxication, and is being treated with CIWA protocol and Ativan . He recently underwent left hip replacement surgery and is on opioids for pain control. His alcohol withdrawal could be contributing to his delirium, although his last CIWA score was 3. He may also be receiving excessive pain medication, so I plan to hold it for now. Given his head injury and current confusion, I believe it is reasonable to order a STAT CT head without contrast to rule out an intracranial bleed. He is also on SCDs  for VTE prophylaxis.  Plan: - Order STAT CT Head without contrast to assess for any potential intracranial bleeding given the fall, head trauma, and ongoing confusion.  -Monitor his mental status closely for any signs of worsening delirium. Keep reassessing his orientation, level of alertness, and overall cognition. - Hold any additional opioids for now. Consider alternative non-opioid pain management strategies. - Ensure bed alarms are functional - Maintain Fall precautions - Delirium precautions   Drue Lisa Grow MD Internal Medicine Resident: PGY-2 Please contact the on call pager at: [phone] 09/03/2024, 6:05 AM

## 2024-09-03 NOTE — Progress Notes (Signed)
 PT Cancellation Note  Patient Details Name: Michael Patton MRN: [id number] DOB: Apr 04, 1956   Cancelled Treatment:    Reason Eval/Treat Not Completed: Other (comment)  Noted fall earlier this am, imaging of pelvis ordered and pending;  Will await results of imaging, and proceed with PT with any new recommendations from MD/Ortho;   Michael Patton, PT  Acute Rehabilitation Services Office [phone] Secure Chat welcomed    Michael Patton 09/03/2024, 9:25 AM

## 2024-09-03 NOTE — Plan of Care (Signed)
   Problem: Education: Goal: Knowledge of General Education information will improve Description Including pain rating scale, medication(s)/side effects and non-pharmacologic comfort measures Outcome: Progressing   Problem: Clinical Measurements: Goal: Ability to maintain clinical measurements within normal limits will improve Outcome: Progressing   Problem: Clinical Measurements: Goal: Will remain free from infection Outcome: Progressing

## 2024-09-03 NOTE — Progress Notes (Signed)
 Physical Therapy Treatment Patient Details Name: Michael Patton MRN: [id number] DOB: July 20, 1956 Today's Date: 09/03/2024   History of Present Illness 68 y.o. male presented to ED 08/30/24 with left femoral neck fracture from a fall.  Underwant L THA on 09/01/24.   PMH significant for but ot limited to: chronic hepatitis C untreated, COPD, chronic alcohol abuse.    PT Comments  Continuing work on functional mobility and activity tolerance;  Initial goal of session was to walk, however after first standing trial, pt was in too much pain for progressive ambulation; Switched gears to functional transfer training, and worked on sit<>stand training and stand pivot transfer OOB to recliner;   With noted fall earlier, and progress slower than anticipated, recommend we pursue post-acute rehab for Michael Patton, <3hrs/day   If plan is discharge home, recommend the following: A lot of help with walking and/or transfers;A lot of help with bathing/dressing/bathroom   Can travel by private vehicle      (likely soon)  Equipment Recommendations  Rolling walker (2 wheels);BSC/3in1    Recommendations for Other Services OT consult     Precautions / Restrictions Precautions Precautions: Fall Recall of Precautions/Restrictions: Impaired Precaution/Restrictions Comments: ciwa Restrictions Weight Bearing Restrictions Per Provider Order: No LLE Weight Bearing Per Provider Order: Weight bearing as tolerated     Mobility  Bed Mobility               General bed mobility comments: PT entered room as pt was getting OOB without assist and bed alarm was going off    Transfers Overall transfer level: Needs assistance Equipment used: Rolling walker (2 wheels) Transfers: Sit to/from Stand, Bed to chair/wheelchair/BSC Sit to Stand: Mod assist Stand pivot transfers: Mod assist         General transfer comment: Cues for hand placement and safety; Mod assist to rise and stabilize from bed to RW;  Plan was to walk, however, after 2 steps, pt turned to sit back down to the bed -- too much pain to walk; opted to bring recliner close and perform a stand pivot transfer; pt needed mod assist to guide hips safely to teh recliner    Ambulation/Gait                   Stairs             Wheelchair Mobility     Tilt Bed    Modified Rankin (Stroke Patients Only)       Balance     Sitting balance-Leahy Scale: Fair Sitting balance - Comments: close CGA     Standing balance-Leahy Scale: Poor Standing balance comment: relies on RW                            Communication Communication Communication: No apparent difficulties  Cognition Arousal: Alert Behavior During Therapy: Impulsive                             Following commands: Impaired Following commands impaired: Follows one step commands with increased time    Cueing Cueing Techniques: Verbal cues  Exercises      General Comments General comments (skin integrity, edema, etc.): Pt impulsive, but cooperative, and participated in conversation; he is a Engineer, technical sales      Pertinent Vitals/Pain Pain Assessment Pain Assessment: Faces Faces Pain Scale: Hurts even more Pain Location: L hip area Pain Descriptors /  Indicators: Grimacing, Guarding Pain Intervention(s): Monitored during session, RN gave pain meds during session    Home Living                          Prior Function            PT Goals (current goals can now be found in the care plan section) Acute Rehab PT Goals Patient Stated Goal: To return home PT Goal Formulation: With patient Time For Goal Achievement: 09/16/24 Potential to Achieve Goals: Good Progress towards PT goals: Progressing toward goals (though slower than anticipated)    Frequency    Min 3X/week      PT Plan      Co-evaluation              AM-PAC PT 6 Clicks Mobility   Outcome Measure  Help needed turning from  your back to your side while in a flat bed without using bedrails?: A Little Help needed moving from lying on your back to sitting on the side of a flat bed without using bedrails?: A Little Help needed moving to and from a bed to a chair (including a wheelchair)?: A Lot Help needed standing up from a chair using your arms (e.g., wheelchair or bedside chair)?: A Lot Help needed to walk in hospital room?: Total Help needed climbing 3-5 steps with a railing? : Total 6 Click Score: 12    End of Session Equipment Utilized During Treatment: Gait belt Activity Tolerance: Patient limited by pain Patient left: in chair;with call bell/phone within reach;with chair alarm set Nurse Communication: Mobility status PT Visit Diagnosis: Unsteadiness on feet (R26.81)     Time: 1250-1313 PT Time Calculation (min) (ACUTE ONLY): 23 min  Charges:    $Gait Training: 8-22 mins $Therapeutic Activity: 8-22 mins PT General Charges $$ ACUTE PT VISIT: 1 Visit                     Silvano Currier, PT  Acute Rehabilitation Services Office [phone] Secure Chat welcomed    Silvano VEAR Currier 09/03/2024, 2:38 PM

## 2024-09-04 ENCOUNTER — Encounter (HOSPITAL_COMMUNITY): Payer: Self-pay | Admitting: Student

## 2024-09-04 ENCOUNTER — Encounter: Payer: Self-pay | Admitting: Internal Medicine

## 2024-09-04 DIAGNOSIS — D696 Thrombocytopenia, unspecified: Secondary | ICD-10-CM | POA: Insufficient documentation

## 2024-09-04 LAB — BASIC METABOLIC PANEL WITH GFR
Anion gap: 11 (ref 5–15)
BUN: 18 mg/dL (ref 8–23)
CO2: 24 mmol/L (ref 22–32)
Calcium: 8.3 mg/dL — ABNORMAL LOW (ref 8.9–10.3)
Chloride: 107 mmol/L (ref 98–111)
Creatinine, Ser: 0.57 mg/dL — ABNORMAL LOW (ref 0.61–1.24)
GFR, Estimated: >60 mL/min
Glucose, Bld: 91 mg/dL (ref 70–99)
Potassium: 3.6 mmol/L (ref 3.5–5.1)
Sodium: 141 mmol/L (ref 135–145)

## 2024-09-04 LAB — PREPARE PLATELET PHERESIS
Unit division: 0
Unit division: 0
Unit division: 0

## 2024-09-04 LAB — BPAM PLATELET PHERESIS
Blood Product Expiration Date: 202512262359
Blood Product Expiration Date: 202512282359
Blood Product Expiration Date: 202512292359
ISSUE DATE / TIME: 202512261355
ISSUE DATE / TIME: 202512280204
Unit Type and Rh: 5100
Unit Type and Rh: 5100
Unit Type and Rh: 6200

## 2024-09-04 LAB — CBC
HCT: 31.7 % — ABNORMAL LOW (ref 39.0–52.0)
Hemoglobin: 11.2 g/dL — ABNORMAL LOW (ref 13.0–17.0)
MCH: 36.1 pg — ABNORMAL HIGH (ref 26.0–34.0)
MCHC: 35.3 g/dL (ref 30.0–36.0)
MCV: 102.3 fL — ABNORMAL HIGH (ref 80.0–100.0)
Platelets: 105 K/uL — ABNORMAL LOW (ref 150–400)
RBC: 3.1 MIL/uL — ABNORMAL LOW (ref 4.22–5.81)
RDW: 13.4 % (ref 11.5–15.5)
WBC: 9.1 K/uL (ref 4.0–10.5)
nRBC: 0 % (ref 0.0–0.2)

## 2024-09-04 LAB — MAGNESIUM: Magnesium: 2 mg/dL (ref 1.7–2.4)

## 2024-09-04 MED ORDER — QUETIAPINE FUMARATE 25 MG PO TABS
25.0000 mg | ORAL_TABLET | Freq: Every day | ORAL | Status: DC
  Administered 2024-09-04: 25 mg via ORAL
  Filled 2024-09-04 (×2): qty 1

## 2024-09-04 NOTE — NC FL2 (Signed)
 " [place]  MEDICAID FL2 LEVEL OF CARE FORM     IDENTIFICATION  Patient Name: Michael Patton Birthdate: Oct 04, 1955 Sex: male Admission Date (Current Location): 08/30/2024  [facility] and [place] Number:  Producer, Television/film/video and Address:  [facility], [address], [place], [place] [address]      Provider Number: [id number]  Attending Physician Name and Address:  Trudy Mliss Dragon, MD  Relative Name and Phone Number:       Current Level of Care: Hospital Recommended Level of Care: Skilled Nursing Facility Prior Approval Number:    Date Approved/Denied:   PASRR Number: [id number]  Discharge Plan: SNF    Current Diagnoses: Patient Active Problem List   Diagnosis Date Noted   Thrombocytopenia 09/04/2024   Acute delirium 09/03/2024   Osteoporosis with current pathological fracture 09/03/2024   Vitamin D  deficiency 09/03/2024   Closed left femoral fracture (HCC) 09/03/2024   Fall 08/30/2024   COPD (chronic obstructive pulmonary disease) (HCC)    Chronic hepatitis C (HCC)    Hyponatremia 11/12/2020   Alcoholic cirrhosis of liver with ascites (HCC)    Uncontrolled pain 10/15/2012   Tobacco abuse 10/15/2012   Constipation 10/15/2012   Alcohol use disorder, severe, dependence (HCC) 10/15/2012    Orientation RESPIRATION BLADDER Height & Weight     Self  Normal Incontinent Weight: 134 lb 7.7 oz (61 kg) Height:  5' 9 (175.3 cm)  BEHAVIORAL SYMPTOMS/MOOD NEUROLOGICAL BOWEL NUTRITION STATUS      Incontinent Diet (see dc summary)  AMBULATORY STATUS COMMUNICATION OF NEEDS Skin   Extensive Assist Verbally Surgical wounds (closed surgical incision 12/26 left hip)                       Personal Care Assistance Level of Assistance  Bathing, Feeding, Dressing Bathing Assistance: Limited assistance Feeding assistance: Independent Dressing Assistance: Limited assistance     Functional Limitations Info  Sight, Hearing, Speech Sight Info:  Impaired Hearing Info: Impaired Speech Info: Adequate    SPECIAL CARE FACTORS FREQUENCY  PT (By licensed PT), OT (By licensed OT)     PT Frequency: 5x week OT Frequency: 5x week            Contractures Contractures Info: Not present    Additional Factors Info  Code Status, Allergies Code Status Info: full Allergies Info: sertaline           Current Medications (09/04/2024):  This is the current hospital active medication list Current Facility-Administered Medications  Medication Dose Route Frequency Provider Last Rate Last Admin   acetaminophen  (TYLENOL ) tablet 500 mg  500 mg Oral QID Danton Domino A, PA-C   500 mg at 09/04/24 1344   aspirin  chewable tablet 81 mg  81 mg Oral BID Danton Domino LABOR, PA-C   81 mg at 09/04/24 9173   bisacodyl  (DULCOLAX) suppository 10 mg  10 mg Rectal Daily PRN Elodie Palma, MD       diphenhydrAMINE  (BENADRYL ) 12.5 MG/5ML elixir 12.5-25 mg  12.5-25 mg Oral Q4H PRN Danton Domino LABOR, PA-C       folic acid  (FOLVITE ) tablet 1 mg  1 mg Oral Daily Danton Domino A, PA-C   1 mg at 09/04/24 9173   lidocaine  (LIDODERM ) 5 % 1 patch  1 patch Transdermal Q24H Danton Domino LABOR, PA-C   1 patch at 09/01/24 1455   menthol  (CEPACOL) lozenge 3 mg  1 lozenge Oral PRN Danton Domino LABOR, PA-C  Or   phenol (CHLORASEPTIC) mouth spray 1 spray  1 spray Mouth/Throat PRN Danton Lauraine LABOR, PA-C       methocarbamol  (ROBAXIN ) tablet 500 mg  500 mg Oral Q6H PRN Danton Lauraine LABOR, PA-C   500 mg at 09/04/24 9173   Or   methocarbamol  (ROBAXIN ) injection 500 mg  500 mg Intravenous Q6H PRN Danton Lauraine LABOR, PA-C       metoCLOPramide  (REGLAN ) tablet 5-10 mg  5-10 mg Oral Q8H PRN Danton Lauraine LABOR, PA-C       Or   metoCLOPramide  (REGLAN ) injection 5-10 mg  5-10 mg Intravenous Q8H PRN Danton, Sarah A, PA-C       multivitamin with minerals tablet 1 tablet  1 tablet Oral Daily Danton Lauraine LABOR, PA-C   1 tablet at 09/04/24 9173   mupirocin  ointment (BACTROBAN ) 2 % 1  Application  1 Application Nasal BID Danton Lauraine LABOR, PA-C   1 Application at 09/04/24 0825   nicotine  (NICODERM CQ  - dosed in mg/24 hours) patch 21 mg  21 mg Transdermal Daily Danton Lauraine LABOR, PA-C   21 mg at 09/04/24 9173   ondansetron  (ZOFRAN ) tablet 4 mg  4 mg Oral Q6H PRN Danton Lauraine LABOR, PA-C       Or   ondansetron  (ZOFRAN ) injection 4 mg  4 mg Intravenous Q6H PRN Danton, Sarah A, PA-C       polyethylene glycol (MIRALAX  / GLYCOLAX ) packet 17 g  17 g Oral BID Syeda, Raeeha, DO   17 g at 09/04/24 9173   senna (SENOKOT) tablet 8.6 mg  1 tablet Oral BID Elodie Palma, MD   8.6 mg at 09/04/24 9173   thiamine  (VITAMIN B1) tablet 100 mg  100 mg Oral Daily Danton Lauraine A, PA-C   100 mg at 09/04/24 0827   Vitamin D  (Ergocalciferol ) (DRISDOL ) 1.25 MG (50000 UNIT) capsule 50,000 Units  50,000 Units Oral Q7 days Elodie Palma, MD   50,000 Units at 09/02/24 1444     Discharge Medications: Please see discharge summary for a list of discharge medications.  Relevant Imaging Results:  Relevant Lab Results:   Additional Information SSN [id number]  Hartley KATHEE Robertson, LCSWA     "

## 2024-09-04 NOTE — Discharge Instructions (Signed)
 "   Franky Light, MD Lauraine Moores PA-C [facility] [address] [place], [place] [address] [phone] (tel)   [phone] (fax)   TOTAL HIP REPLACEMENT POSTOPERATIVE DIRECTIONS    Hip Rehabilitation, Guidelines Following Surgery   WEIGHT BEARING Weight bearing as tolerated with assist device (walker, cane, etc) as directed, use it as long as suggested by your surgeon or therapist, typically at least 4-6 weeks.  The results of a hip operation are greatly improved after range of motion and muscle strengthening exercises. Follow all safety measures which are given to protect your hip. If any of these exercises cause increased pain or swelling in your joint, decrease the amount until you are comfortable again. Then slowly increase the exercises. Call your caregiver if you have problems or questions.   HOME CARE INSTRUCTIONS  Most of the following instructions are designed to prevent the dislocation of your new hip.  Remove items at home which could result in a fall. This includes throw rugs or furniture in walking pathways.  Continue medications as instructed at time of discharge. You may have some home medications which will be placed on hold until you complete the course of blood thinner medication. You may start showering once you are discharged home. Do not remove your dressing. Do not put on socks or shoes without following the instructions of your caregivers.   Sit on chairs with arms. Use the chair arms to help push yourself up when arising.  Arrange for the use of a toilet seat elevator so you are not sitting low.  Walk with walker as instructed.  You may resume a sexual relationship in one month or when given the OK by your caregiver.  Use walker as long as suggested by your caregivers.  You may put full weight on your legs and walk as much as is comfortable. Avoid periods of inactivity such as sitting longer than an hour when not asleep. This helps  prevent blood clots.  You may return to work once you are cleared by designer, industrial/product.  Do not drive a car for 6 weeks or until released by your surgeon.  Do not drive while taking narcotics.  Wear elastic stockings for two weeks following surgery during the day but you may remove then at night.  Make sure you keep all of your appointments after your operation with all of your doctors and caregivers. You should call the office at the above phone number and make an appointment for approximately two weeks after the date of your surgery. Please pick up a stool softener and laxative for home use as long as you are requiring pain medications. ICE to the affected hip every three hours for 30 minutes at a time and then as needed for pain and swelling. Continue to use ice on the hip for pain and swelling from surgery. You may notice swelling that will progress down to the foot and ankle.  This is normal after surgery.  Elevate the leg when you are not up walking on it.   It is important for you to complete the blood thinner medication as prescribed by your doctor. Continue to use the breathing machine which will help keep your temperature down.  It is common for your temperature to cycle up and down following surgery, especially at night when you are not up moving around and exerting yourself.  The breathing machine keeps your lungs expanded and your temperature down.  RANGE OF MOTION AND STRENGTHENING EXERCISES  These  exercises are designed to help you keep full movement of your hip joint. Follow your caregiver's or physical therapist's instructions. Perform all exercises about fifteen times, three times per day or as directed. Exercise both hips, even if you have had only one joint replacement. These exercises can be done on a training (exercise) mat, on the floor, on a table or on a bed. Use whatever works the best and is most comfortable for you. Use music or television while you are exercising so that the  exercises are a pleasant break in your day. This will make your life better with the exercises acting as a break in routine you can look forward to.  Lying on your back, slowly slide your foot toward your buttocks, raising your knee up off the floor. Then slowly slide your foot back down until your leg is straight again.  Lying on your back spread your legs as far apart as you can without causing discomfort.  Lying on your side, raise your upper leg and foot straight up from the floor as far as is comfortable. Slowly lower the leg and repeat.  Lying on your back, tighten up the muscle in the front of your thigh (quadriceps muscles). You can do this by keeping your leg straight and trying to raise your heel off the floor. This helps strengthen the largest muscle supporting your knee.  Lying on your back, tighten up the muscles of your buttocks both with the legs straight and with the knee bent at a comfortable angle while keeping your heel on the floor.   SKILLED REHAB INSTRUCTIONS: If the patient is transferred to a skilled rehab facility following release from the hospital, a list of the current medications will be sent to the facility for the patient to continue.  When discharged from the skilled rehab facility, please have the facility set up the patient's Home Health Physical Therapy prior to being released. Also, the skilled facility will be responsible for providing the patient with their medications at time of release from the facility to include their pain medication and their blood thinner medication. If the patient is still at the rehab facility at time of the two week follow up appointment, the skilled rehab facility will also need to assist the patient in arranging follow up appointment in our office and any transportation needs.  POST-OPERATIVE OPIOID TAPER INSTRUCTIONS: It is important to wean off of your opioid medication as soon as possible. If you do not need pain medication after your  surgery it is ok to stop day one. Opioids include: Codeine, Hydrocodone (Norco, Vicodin), Oxycodone (Percocet, oxycontin ) and hydromorphone  amongst others.  Long term and even short term use of opiods can cause: Increased pain response Dependence Constipation Depression Respiratory depression And more.  Withdrawal symptoms can include Flu like symptoms Nausea, vomiting And more Techniques to manage these symptoms Hydrate well Eat regular healthy meals Stay active Use relaxation techniques(deep breathing, meditating, yoga) Do Not substitute Alcohol to help with tapering If you have been on opioids for less than two weeks and do not have pain than it is ok to stop all together.  Plan to wean off of opioids This plan should start within one week post op of your joint replacement. Maintain the same interval or time between taking each dose and first decrease the dose.  Cut the total daily intake of opioids by one tablet each day Next start to increase the time between doses. The last dose that should be eliminated is the  evening dose.    MAKE SURE YOU:  Understand these instructions.  Will watch your condition.  Will get help right away if you are not doing well or get worse.  Pick up stool softner and laxative for home use following surgery while on pain medications. Do not remove your dressing. The dressing is waterproof--it is OK to take showers. Continue to use ice for pain and swelling after surgery. Do not use any lotions or creams on the incision until instructed by your surgeon. Total Hip Protocol.  Thank you for allowing us  to be part of your care. You were hospitalized for fall. 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 You are going to a medical center to work with physical therapy before you go home called a Skilled Nursing Facility.   See the changes in your medications and management of your chronic conditions below:  *For your recovery postsurgery -  Continue taking aspirin  81 mg twice daily for 1 month - Continue weightbearing as tolerated with help from family/friends - Be cautious as you are at increased risk for falls - You can continue Tylenol  650 mg every 6 hours as needed for pain.  You can continue to elevate and ice your hip. - Home health was ordered to help with recovery at home.  *For your alcohol use - You were treated in the hospital for alcohol withdrawal.  You received medications such as multivitamin, thiamine , folate.  I would encourage you to continue stopping drinking. - Continue taking vitamin D  capsule weekly, and thiamine  daily, and multivitamin daily  *For your hepatitis -Please follow-up with your primary care doctor regarding your hepatitis C labs  - Please see primary care doctor in [place] in 7 to 10 days - Follow-up with Dr. Kendal with orthopedic surgery in 2 weeks.  FOLLOW UP APPOINTMENTS: - Please see primary care doctor in [place] in 7 to 10 days - Follow-up with Dr. Kendal with orthopedic surgery in 2 weeks.  Please make sure to   Please call your PCP or our clinic if you have any questions or concerns, we may be able to help and keep you from a long and expensive emergency room wait. Our clinic and after hours phone number is [phone]. The best time to call is Monday through Friday 9 am to 4 pm but there is always someone available 24/7 if you have an emergency. If you need medication refills please notify your pharmacy one week in advance and they will send us  a request.   We are glad you are feeling better,  Alfornia Light, DO Internal Medicine Inpatient Teaching Service at [facility]   "

## 2024-09-04 NOTE — Plan of Care (Signed)
   Problem: Clinical Measurements: Goal: Will remain free from infection Outcome: Progressing   Problem: Clinical Measurements: Goal: Respiratory complications will improve Outcome: Progressing

## 2024-09-04 NOTE — Progress Notes (Addendum)
 "  HD#5 SUBJECTIVE:  Patient Summary: Michael Patton is a 68 y.o. with a pertinent PMH of alcohol use disorder, recurrent falls, COPD, untreated hepatitis C, suspected alcohol and Hep C-related cirrhosis, reported seizure history, who presented with mechanical fall resulting in displaced left femoral neck fracture.  He is now status post left total hip arthroplasty pending SNF.   Overnight Events: No overnight events  Interim History: Reports that he feels overall pretty good and better then yesterday. He still feels like he stumbles when walking or getting out of the bed but this is improved from yesterday. He denies any pain today. He has not had a fall since yesterday morning. At home, he reports waling without assistive device without difficulty prior to hospitalization. Has been walking with PT and OT. He reports that he has not had a bowel movement yet and is supposed to get an enema later today. Did have flatus last night. He feels that he would like to continue to strengthen his lower extremities in the outpatient setting and would like to get back to his normal self. Denies abdominal pain, distension, bloating. Denies chest pain, shortness of breath. Denies history of urinary obstruction. Denies pain in the back of his heels, sacrum, and buttocks.  OBJECTIVE:  Vital Signs: Vitals:   09/03/24 1326 09/03/24 2154 09/04/24 0600 09/04/24 0728  BP: 117/74 124/76 (!) 155/98 135/86  Pulse: (!) 103 98 98 94  Resp: 19 16 17 16   Temp: 97.9 F (36.6 C) 98.9 F (37.2 C) 98.9 F (37.2 C) 98.5 F (36.9 C)  TempSrc:  Oral  Oral  SpO2: 96% 99% 99% 97%  Weight:      Height:       Supplemental O2: Room Air SpO2: 97 % O2 Flow Rate (L/min):  (2 L/M)  Filed Weights   08/30/24 1722  Weight: 134 lb 7.7 oz (61 kg)     Intake/Output Summary (Last 24 hours) at 09/04/2024 1350 Last data filed at 09/04/2024 0037 Gross per 24 hour  Intake 120 ml  Output 700 ml  Net -580 ml   Net IO Since  Admission: 251.33 mL [09/04/24 1350]  Physical Exam: Physical Exam Constitutional:      General: He is not in acute distress.    Appearance: He is not ill-appearing, toxic-appearing or diaphoretic.  Cardiovascular:     Rate and Rhythm: Normal rate and regular rhythm.     Pulses: Normal pulses.     Heart sounds: Normal heart sounds. No murmur heard.    No friction rub. No gallop.  Pulmonary:     Effort: Pulmonary effort is normal.     Breath sounds: Normal breath sounds. No stridor. No wheezing, rhonchi or rales.  Abdominal:     Palpations: Abdomen is soft.     Tenderness: There is no abdominal tenderness.  Musculoskeletal:     Right lower leg: No edema.     Left lower leg: No edema.     Comments: Moving all extremities.  Sensation intact  Skin:    General: Skin is warm and dry.     Findings: Bruising (Present on the left forehead, diffusely over upper extremities, left hip bruising.  Nontender to palpation) present.  Neurological:     General: No focal deficit present.     Mental Status: He is alert and oriented to person, place, and time. Mental status is at baseline.   No tenderness of heels or nonblanching redness  Patient Lines/Drains/Airways Status  Active Line/Drains/Airways     Name Placement date Placement time Site Days   Peripheral IV 08/31/24 20 G Anterior;Left Forearm 08/31/24  1344  Forearm  4   Peripheral IV 09/01/24 18 G Left Hand 09/01/24  0751  Hand  3   Wound 09/01/24 0949 Surgical Closed Surgical Incision Hip Left 09/01/24  0949  Hip  3            Pertinent labs and imaging:      Latest Ref Rng & Units 09/04/2024    3:53 AM 09/03/2024    7:54 AM 09/02/2024    7:48 AM  CBC  WBC 4.0 - 10.5 K/uL 9.1  8.7  8.5   Hemoglobin 13.0 - 17.0 g/dL 88.7  88.9  89.1   Hematocrit 39.0 - 52.0 % 31.7  30.7  29.9   Platelets 150 - 400 K/uL 105  88  69        Latest Ref Rng & Units 09/04/2024    3:53 AM 09/03/2024    7:54 AM 09/02/2024    7:48 AM   CMP  Glucose 70 - 99 mg/dL 91  886  896   BUN 8 - 23 mg/dL 18  18  16    Creatinine 0.61 - 1.24 mg/dL 9.42  9.44  9.48   Sodium 135 - 145 mmol/L 141  137  133   Potassium 3.5 - 5.1 mmol/L 3.6  3.4  3.8   Chloride 98 - 111 mmol/L 107  103  101   CO2 22 - 32 mmol/L 24  24  23    Calcium 8.9 - 10.3 mg/dL 8.3  8.3  8.1     No results found.   ASSESSMENT/PLAN:  Assessment: Principal Problem:   Closed left femoral fracture (HCC) Active Problems:   Fall   Acute delirium   Osteoporosis with current pathological fracture   Vitamin D  deficiency   Alcoholic cirrhosis of liver with ascites (HCC)   Chronic hepatitis C (HCC)   Tobacco abuse   Alcohol use disorder, severe, dependence (HCC)   COPD (chronic obstructive pulmonary disease) (HCC)   Plan: #Left femoral neck fracture s/p left total hip arthroplasty Surgery 12/26.  Postop day 3.  Xray post fall did not show any changes to surgical site. Patient amenable to SNF. Stated that he still feels unsteady on his feet. Is passing gas but has not had a bowel movement since admission. Has been mostly in bed but working with PT/OT. no obvious heel ulcers. Denies pressure points on contact with the bed. Pain well controlled with current pain regimen.   - Weightbearing as tolerated to left lower extremity - Robaxin  500 mg every 6 hours as needed for muscle spasms - DVT prophylaxis: Aspirin  81 mg twice daily - Bowel regimen: MiraLAX , senna twice daily, bisacodyl  suppository  - Pain control: Tylenol  500 mg 4 times daily, Tylenol  650 mg every 6 hours as needed, lidocaine  patch - Incentive spirometry - Elevate and apply cold therapy. - Follow-up in 2 weeks post-op with Dr. Kendal - Pending SNF --external urinary catheter d/c.   Concern for urinary reflux given position in bed.   # Fall in hospital # Suspected hospital-acquired delirium - resolved No overnight events. Imaging post fall is unremarkable. Patient is alert and oriented without focal  deficits.  - Delirium precautions ordered  # AUD No signs of withdrawal during hospitalization.  - Thiamine  100 mg daily - Folate 1 mg daily - Multivitamin 1 tablet daily - Reglan  5-10 mg  every 8 hours - Zofran  4 mg every 8 hours as needed   #Cirrhotic hepatic morphology #Trace RUQ ascites #Chronic untreated hep C Imaging demonstrated cirrhotic hepatic morphology without focal lesions and trace right upper quadrant ascites.  Likely 2/2 chronic alcohol use and untreated hep C.  MELD score 8.  No evidence of variceal bleeding, SBP, significant ascites.  Labs notable for AST > ALT consistent with alcohol related liver injury. Thrombocytopenia at 105.  - Stable  #Hyponatremia Resolved. Sodium 141. Hyponatremia in setting of chronic hyponatremia 2/2 alcohol use.  #Osteoporosis - Ergocalciferol  1.25 mg q. 7 days  #Chronic medical conditions COPD - Stable Tobacco use disorder - Nicotine  patch 21 mg daily  Best Practice: Diet: Regular diet IVF: Fluids: None, Rate: None VTE: SCDs Start: 09/01/24 1308 Place TED hose Start: 09/01/24 1308 ASA 81 mg BID Code: Full  Disposition planning: Therapy Recs: Pending, DME: other PT/OT DISPO: Anticipated discharge pending SNF.  Signature:  Dr. Sallyanne Primas, DO [facility]  1:50 PM, 09/04/2024  On Call pager [phone]  "
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
## 2024-09-04 NOTE — Progress Notes (Signed)
 Physical Therapy Treatment Patient Details Name: Michael Patton MRN: [id number] DOB: 1955/12/12 Today's Date: 09/04/2024   History of Present Illness 68 y.o. male presented to ED 08/30/24 with left femoral neck fracture from a fall.  Underwant L THA on 09/01/24.   PMH significant for but ot limited to: chronic hepatitis C untreated, COPD, chronic alcohol abuse.    PT Comments  Continuing work on functional mobility and activity tolerance;  Session focused on therapeutic exercise aimed at hip mobility and strength; Michael Patton seemed to enjoy the exercises, and was engaged in reading them on the HEP form; very nice participation and good form with cues; He told this PT that he doesn't have much assistance at home and is open to getting some extra rehab to maximize independence and safety with mobility     If plan is discharge home, recommend the following: A lot of help with walking and/or transfers;A lot of help with bathing/dressing/bathroom   Can travel by private vehicle      (Likely soon)  Equipment Recommendations  Rolling walker (2 wheels);BSC/3in1    Recommendations for Other Services       Precautions / Restrictions Precautions Precautions: Fall Recall of Precautions/Restrictions: Impaired Precaution/Restrictions Comments: ciwa; seems to be improving daily Restrictions Weight Bearing Restrictions Per Provider Order: No LLE Weight Bearing Per Provider Order: Weight bearing as tolerated     Mobility  Bed Mobility                    Transfers                        Ambulation/Gait                   Stairs             Wheelchair Mobility     Tilt Bed    Modified Rankin (Stroke Patients Only)       Balance                                            Communication Communication Communication: No apparent difficulties  Cognition Arousal: Alert Behavior During Therapy: Impulsive   PT - Cognitive  impairments: No apparent impairments                         Following commands: Impaired Following commands impaired: Follows one step commands with increased time (Still incr time, but better than last PT session)    Cueing Cueing Techniques: Verbal cues  Exercises Total Joint Exercises Ankle Circles/Pumps: AROM, Right, Left, 20 reps, Supine Quad Sets: AROM, Left, 10 reps, Supine Gluteal Sets: AROM, Both, 10 reps, Supine Towel Squeeze: AROM, Both, 10 reps, Supine Short Arc Quad: AROM, Left, 10 reps, Supine Heel Slides: AAROM, Left, 10 reps, Supine, Other (comment) (Self-AAROM using gait belt looped around foot) Hip ABduction/ADduction: AAROM, Left, 5 reps, Supine (Used gait belt to for self-AAROM)    General Comments        Pertinent Vitals/Pain Pain Assessment Pain Assessment: Faces Faces Pain Scale: Hurts even more Pain Location: L hip area Pain Descriptors / Indicators: Grimacing, Guarding Pain Intervention(s): Limited activity within patient's tolerance    Home Living  Prior Function            PT Goals (current goals can now be found in the care plan section) Acute Rehab PT Goals Patient Stated Goal: To return home PT Goal Formulation: With patient Time For Goal Achievement: 09/16/24 Potential to Achieve Goals: Good Progress towards PT goals: Progressing toward goals    Frequency    Min 3X/week      PT Plan      Co-evaluation              AM-PAC PT 6 Clicks Mobility   Outcome Measure  Help needed turning from your back to your side while in a flat bed without using bedrails?: A Little Help needed moving from lying on your back to sitting on the side of a flat bed without using bedrails?: A Little Help needed moving to and from a bed to a chair (including a wheelchair)?: A Lot Help needed standing up from a chair using your arms (e.g., wheelchair or bedside chair)?: A Lot Help needed to walk in  hospital room?: Total Help needed climbing 3-5 steps with a railing? : Total 6 Click Score: 12    End of Session Equipment Utilized During Treatment: Gait belt Activity Tolerance: Patient tolerated treatment well Patient left: in bed;with call bell/phone within reach;with bed alarm set Nurse Communication: Mobility status PT Visit Diagnosis: Unsteadiness on feet (R26.81)     Time: 0938-1000 PT Time Calculation (min) (ACUTE ONLY): 22 min  Charges:    $Therapeutic Exercise: 8-22 mins PT General Charges $$ ACUTE PT VISIT: 1 Visit                     Silvano Currier, PT  Acute Rehabilitation Services Office [phone] Secure Chat welcomed    Silvano VEAR Currier 09/04/2024, 12:00 PM

## 2024-09-04 NOTE — TOC Initial Note (Signed)
 Transition of Care [facility]) - Initial/Assessment Note    Patient Details  Name: Michael Patton MRN: [id number] Date of Birth: 1956-04-21  [facility] CM/SW Contact:    [name], LCSW Phone Number: 09/04/2024, 3:57 PM  Clinical Narrative:                  CSW met with pt bedside to discuss SNF recommendation. He is agreeable to SNF w/u. CSW explained process. Pt states he does have medicare but doesn't know what kind or have the card/number. Fl2 completed and bed requests sent in hub.   Expected Discharge Plan: Skilled Nursing Facility Barriers to Discharge: Continued Medical Work up, SNF Pending bed offer       Expected Discharge Plan and Services       Living arrangements for the past 2 months: Skilled Nursing Facility                                      Prior Living Arrangements/Services Living arrangements for the past 2 months: Skilled Nursing Facility   Patient language and need for interpreter reviewed:: Yes        Need for Family Participation in Patient Care: No (Comment) Care giver support system in place?: No (comment)   Criminal Activity/Legal Involvement Pertinent to Current Situation/Hospitalization: No - Comment as needed  Activities of Daily Living   ADL Screening (condition at time of admission) Independently performs ADLs?: No Does the patient have a NEW difficulty with bathing/dressing/toileting/self-feeding that is expected to last >3 days?: Yes (Initiates electronic notice to provider for possible OT consult) Does the patient have a NEW difficulty with getting in/out of bed, walking, or climbing stairs that is expected to last >3 days?: Yes (Initiates electronic notice to provider for possible PT consult) Does the patient have a NEW difficulty with communication that is expected to last >3 days?: No Is the patient deaf or have difficulty hearing?: No Does the patient have difficulty seeing, even when wearing  glasses/contacts?: No Does the patient have difficulty concentrating, remembering, or making decisions?: No  Permission Sought/Granted                  Emotional Assessment Appearance:: Appears stated age Attitude/Demeanor/Rapport: Engaged Affect (typically observed): Accepting Orientation: : Oriented to Self, Oriented to Place, Oriented to Situation, Oriented to  Time Alcohol / Substance Use: Alcohol Use Psych Involvement: No (comment)  Admission diagnosis:  Hyponatremia [E87.1] Fall [W19.XXXA] Closed fracture of left hip, initial encounter (HCC) [S72.002A] Fall, initial encounter [W19.XXXA] Alcoholic intoxication without complication [F10.920] Alcohol dependence with unspecified alcohol-induced disorder (HCC) [F10.29] Patient Active Problem List   Diagnosis Date Noted   Thrombocytopenia 09/04/2024   Acute delirium 09/03/2024   Osteoporosis with current pathological fracture 09/03/2024   Vitamin D  deficiency 09/03/2024   Closed left femoral fracture (HCC) 09/03/2024   Fall 08/30/2024   COPD (chronic obstructive pulmonary disease) (HCC)    Chronic hepatitis C (HCC)    Hyponatremia 11/12/2020   Alcoholic cirrhosis of liver with ascites (HCC)    Uncontrolled pain 10/15/2012   Tobacco abuse 10/15/2012   Constipation 10/15/2012   Alcohol use disorder, severe, dependence (HCC) 10/15/2012   PCP:  Clinic, [facility] Pharmacy:   [facility] - [place], [place] - [address] [place] [address] Phone: [phone] Fax: [phone]  [facility] [address] [place] [address] Phone: [phone] Fax: [phone]     Social Drivers of Health (SDOH) Social History: SDOH Screenings   Food Insecurity: No Food Insecurity (09/02/2024)  Housing: Low Risk (09/02/2024)  Transportation Needs: Unmet Transportation Needs (09/02/2024)  Utilities: Not At Risk  (09/02/2024)  Social Connections: Socially Isolated (09/02/2024)  Tobacco Use: High Risk (08/30/2024)   SDOH Interventions:     Readmission Risk Interventions     No data to display

## 2024-09-05 MED ORDER — HALOPERIDOL 1 MG PO TABS: 1.0000 mg | ORAL_TABLET | Freq: Four times a day (QID) | ORAL | Status: DC | PRN

## 2024-09-05 MED ORDER — HALOPERIDOL LACTATE 5 MG/ML IJ SOLN: 1.0000 mg | Freq: Four times a day (QID) | INTRAMUSCULAR | Status: DC | PRN

## 2024-09-05 MED ORDER — QUETIAPINE FUMARATE 25 MG PO TABS: 50.0000 mg | ORAL_TABLET | Freq: Every day | ORAL | Status: DC

## 2024-09-05 NOTE — Progress Notes (Signed)
Refusing vital signs at this time.
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
## 2024-09-05 NOTE — Progress Notes (Signed)
 Asked me to get him beer multiple times throughout shift.   Tried to re-orient him to being in the hospital and that we do not serve beer. Unable to re-orient.

## 2024-09-05 NOTE — Progress Notes (Cosign Needed Addendum)
 "  HD#6 SUBJECTIVE:  Patient Summary: Michael Patton is a 68 y.o. with a pertinent PMH of alcohol use disorder, recurrent falls, COPD, untreated hepatitis C, suspected alcohol and Hep C-related cirrhosis, reported seizure history, who presented with mechanical fall resulting in displaced left femoral neck fracture.  He is now status post left total hip arthroplasty pending SNF.   Overnight Events: On 12/29 around 5:30 PM, on-call team received notification from RN that patient was try to get out of bed stating he saw 3 Joker's that had escaped at the window and saw armed officers as well.  He stated roommate was recording the whole thing.  Patient was not able to be reduced directed and Seroquel  25 mg was added for bedtime.  Patient was amenable to taking this medication.  Unfortunately further on in the night around 3-7 AM, patient was found very confused talking about a young male and young boys in his room trying to start a fire, a ghost, being in a restaurant, being at home, and other police officers.    Interim History:  When assessing the patient bedside around 7 AM, patient was not endorsing hallucinations, alert and oriented to self, location, situation, year and vocalized wanting to leave the hospital due to nobody believing him seeing these things, the medications that he was receiving, and feeling like he could get better help at home.  Risks of leaving were explained to the patient then and patient was able to verbalize and exhibit understanding of the risks such as falling, bleed, death, deconditioning, blood clots.  He stated that he has people that can take care of him and he can do the exercises of physical therapy on his own.  He stated that he would leave at 2 PM no matter what.  Called patient's brother around 34 on 12/30.  Patient's brother stated that his brother does not have people to care for him at home as he lives in [place] housing with 6 roommates.  Patient's brother stated  that he tried to explain that he is not strong enough as well and encouraged him to remain in hospital. Encouraged patient to come bedside to have discussion with myself and patient. Patient's brother agreeable.   OBJECTIVE:  Vital Signs: Vitals:   09/04/24 0728 09/04/24 1425 09/04/24 1953 09/05/24 0734  BP: 135/86 114/67 130/77 (!) 85/61  Pulse: 94 (!) 102 (!) 106 (!) 106  Resp: 16 16 18 16   Temp: 98.5 F (36.9 C) 98.4 F (36.9 C) 98.3 F (36.8 C) 98.4 F (36.9 C)  TempSrc: Oral Oral Oral   SpO2: 97% 96% 98% 97%  Weight:      Height:       Supplemental O2: Room Air SpO2: 97 % O2 Flow Rate (L/min):  (2 L/M)  Filed Weights   08/30/24 1722  Weight: 61 kg     Intake/Output Summary (Last 24 hours) at 09/05/2024 1153 Last data filed at 09/05/2024 0900 Gross per 24 hour  Intake 600 ml  Output --  Net 600 ml   Net IO Since Admission: 851.33 mL [09/05/24 1153]  Physical Exam: Physical Exam Constitutional:      General: He is not in acute distress.    Appearance: He is not ill-appearing, toxic-appearing or diaphoretic.  Cardiovascular:     Rate and Rhythm: Normal rate and regular rhythm.     Pulses: Normal pulses.     Heart sounds: Normal heart sounds. No murmur heard.    No friction rub. No  gallop.  Pulmonary:     Effort: Pulmonary effort is normal.     Breath sounds: Normal breath sounds. No stridor. No wheezing, rhonchi or rales.  Abdominal:     Palpations: Abdomen is soft.     Tenderness: There is no abdominal tenderness.  Musculoskeletal:        General: Swelling (left hip) present. No tenderness (left hip).     Right lower leg: No edema.     Left lower leg: No edema.     Comments: Moving all extremities.  Sensation intact  Skin:    General: Skin is warm and dry.     Findings: Bruising (Present on the left forehead, diffusely over upper extremities, left hip bruising.  Nontender to palpation) present.  Neurological:     General: No focal deficit present.      Mental Status: He is alert and oriented to person, place, and time. Mental status is at baseline.     Comments: Oriented to self, time, location, year, situation. Able to verbalize in his own words the risks of leaving the hospital at this time.       Patient Lines/Drains/Airways Status     Active Line/Drains/Airways     Name Placement date Placement time Site Days   Wound 09/01/24 0949 Surgical Closed Surgical Incision Hip Left 09/01/24  0949  Hip  4            Pertinent labs and imaging:      Latest Ref Rng & Units 09/04/2024    3:53 AM 09/03/2024    7:54 AM 09/02/2024    7:48 AM  CBC  WBC 4.0 - 10.5 K/uL 9.1  8.7  8.5   Hemoglobin 13.0 - 17.0 g/dL 88.7  88.9  89.1   Hematocrit 39.0 - 52.0 % 31.7  30.7  29.9   Platelets 150 - 400 K/uL 105  88  69        Latest Ref Rng & Units 09/04/2024    3:53 AM 09/03/2024    7:54 AM 09/02/2024    7:48 AM  CMP  Glucose 70 - 99 mg/dL 91  886  896   BUN 8 - 23 mg/dL 18  18  16    Creatinine 0.61 - 1.24 mg/dL 9.42  9.44  9.48   Sodium 135 - 145 mmol/L 141  137  133   Potassium 3.5 - 5.1 mmol/L 3.6  3.4  3.8   Chloride 98 - 111 mmol/L 107  103  101   CO2 22 - 32 mmol/L 24  24  23    Calcium 8.9 - 10.3 mg/dL 8.3  8.3  8.1     No results found.   ASSESSMENT/PLAN:  Assessment: Principal Problem:   Closed left femoral fracture (HCC) Active Problems:   Tobacco abuse   Alcohol use disorder, severe, dependence (HCC)   Alcoholic cirrhosis of liver with ascites (HCC)   COPD (chronic obstructive pulmonary disease) (HCC)   Chronic hepatitis C (HCC)   Fall   Acute delirium   Osteoporosis with current pathological fracture   Vitamin D  deficiency   Plan: #Left femoral neck fracture s/p left total hip arthroplasty Surgery 12/26.  Postop day 4. Passing gas. Reports bowel movement this morning.  Nontender to palpation.  PT/OT stated that he seems to enjoy the exercises and had great participation, however he does not have much  assistance at home and so would benefit from SNF for maximization of independence and safety with mobility.  Nursing states  that he has a very very difficult time getting out of bed.  On physical exam he struggles with lifting himself forward.  Patient is very adamant about going home. Patient was alert and oriented to self, location, year, situation and was able to voice risks of leaving without SNF such as recurrent/dangerous falls, deconditioning, bleed, death, blood clot.  After discussion with patient's brother and patient bedside, patient agreeable to SNF.  - Weightbearing as tolerated to left lower extremity - Robaxin  500 mg every 6 hours as needed for muscle spasms - DVT prophylaxis: Aspirin  81 mg twice daily - Bowel regimen: MiraLAX , senna twice daily, bisacodyl  suppository  - Pain control: Tylenol  500 mg 4 times daily, Tylenol  650 mg every 6 hours as needed, lidocaine  patch - Incentive spirometry - Elevate and apply cold therapy. - Follow-up in 2 weeks post-op with Dr. Kendal  # Suspected hospital-acquired delirium - resolved # Fall in hospital Overnight event as listed above. Hallucinations do not appear dangerous and do not tell him to do anything.  I believe the hallucinations are hospital-acquired delirium as they are waxing and waning and this patient does not have recorded history of previous psychiatric illness or concerns in hospital in chart or per brother. The patient would be safe for discharge from a psychiatric standpoint and he has capacity, however from an orthopedic standpoint, he is not strong enough to care for himself and per brother and PT, there is not much help within his [place] housing, although as stated above, patient understands these risks.  Patient's brother was called and came bedside to help facilitate conversion. After discussion of needing SNF, patient agreeable again. Discussed with patient that he will have nightly Seroquel  given but if he refuses and later  presents with worsening agitation/harm to self/harm to others, another medication called haldol would be administered. Patient understood.  - Seroquel  50 mg nightly -- Haldol 1 mg IV or IM or Tablet PRN for agitation -- encouraged patient's brother to have family and friends visit during the day to help with delirium.   # AUD No signs of withdrawal during hospitalization.  - Thiamine  100 mg daily - Folate 1 mg daily - Multivitamin 1 tablet daily - Reglan  5-10 mg every 8 hours - Zofran  4 mg every 8 hours as needed  #Cirrhotic hepatic morphology #Trace RUQ ascites #Chronic untreated hep C Imaging demonstrated cirrhotic hepatic morphology without focal lesions and trace right upper quadrant ascites.  Likely 2/2 chronic alcohol use and untreated hep C.  MELD score 8.  No evidence of variceal bleeding, SBP, significant ascites.  Labs notable for AST > ALT consistent with alcohol related liver injury. Thrombocytopenia at 105.  - Hepatitis C labs have been ordered - Can follow-up outpatient with primary care at [facility] regarding results  #Hyponatremia Resolved. Hyponatremia in setting of chronic hyponatremia 2/2 alcohol use.  #Osteoporosis - Ergocalciferol  1.25 mg q. 7 days  #Chronic medical conditions COPD - Stable Tobacco use disorder - Nicotine  patch 21 mg daily  Best Practice: Diet: Regular diet IVF: Fluids: None, Rate: None VTE: SCDs Start: 09/01/24 1308 Place TED hose Start: 09/01/24 1308 ASA 81 mg BID Code: Full  Disposition planning: Therapy Recs: Pending, DME: other PT/OT DISPO: Recommending SNF, however patient wanting home.  Signature:  Dr. Sallyanne Primas, DO [facility]  11:53 AM, 09/05/2024  On Call pager [phone]  "

## 2024-09-05 NOTE — Discharge Summary (Incomplete)
 "  Name: Michael Patton MRN: [id number] DOB: 14-May-1956 68 y.o. PCP: [facility]  Date of Admission: 08/30/2024  5:05 PM Date of Discharge: 09/06/24 Attending Physician: Dr. Reyes Patton  Discharge Diagnosis: 1. Principal Problem:   Closed left femoral fracture (HCC) Active Problems:   Tobacco abuse   Alcohol use disorder, severe, dependence (HCC)   Alcoholic cirrhosis of liver with ascites (HCC)   COPD (chronic obstructive pulmonary disease) (HCC)   Chronic hepatitis C (HCC)   Fall   Acute delirium   Osteoporosis with current pathological fracture   Vitamin D  deficiency   Discharge Medications: Allergies as of 09/06/2024       Reactions   Sertraline Rash        Medication List     TAKE these medications    acetaminophen  500 MG tablet Commonly known as: TYLENOL  Take 1,000 mg by mouth every 6 (six) hours as needed for headache or mild pain (pain score 1-3).   aspirin  81 MG chewable tablet Chew 1 tablet (81 mg total) by mouth 2 (two) times daily.   carboxymethylcellulose 0.5 % Soln Commonly known as: REFRESH PLUS Place 2 drops into both eyes daily as needed (dry eyes).   folic acid  1 MG tablet Commonly known as: FOLVITE  Take 1 tablet (1 mg total) by mouth daily. Start taking on: September 07, 2024   lidocaine  5 % Commonly known as: LIDODERM  Place 1 patch onto the skin daily. Remove & Discard patch within 12 hours or as directed by MD Start taking on: September 07, 2024   multivitamin with minerals Tabs tablet Take 1 tablet by mouth daily. Start taking on: September 07, 2024   nicotine  21 mg/24hr patch Commonly known as: NICODERM CQ  - dosed in mg/24 hours Place 1 patch (21 mg total) onto the skin daily. Start taking on: September 07, 2024   polyethylene glycol 17 g packet Commonly known as: MIRALAX  / GLYCOLAX  Take 17 g by mouth 2 (two) times daily.   senna 8.6 MG Tabs tablet Commonly known as: SENOKOT Take 1 tablet (8.6 mg total) by mouth 2  (two) times daily.   thiamine  100 MG tablet Commonly known as: Vitamin B-1 Take 1 tablet (100 mg total) by mouth daily. Start taking on: September 07, 2024   Vitamin D  (Ergocalciferol ) 1.25 MG (50000 UNIT) Caps capsule Commonly known as: DRISDOL  Take 1 capsule (50,000 Units total) by mouth every 7 (seven) days. Start taking on: September 09, 2024               Discharge Care Instructions  (From admission, onward)           Start     Ordered   09/06/24 0000  Change dressing (specify)       Comments: Dressing change: once per day   09/06/24 1242            Disposition and follow-up:   Michael Patton was discharged from [facility] in Good condition.  At the hospital follow up visit please address:  1.   S/p left total hip arthoplasty: Ensure adherence to ASA 81 mg BID per ortho surgery. Ensure f/u with orthopedic surgery. Ensure adherence with proper conditioning and therapy. Assess for recent falls.  Hallucinations: Likely 2/2 hospital acquired delirium. AUD: encourage cessation of alcohol  Cirrhosis and chronic untreated hep C: follow up on hepatitis labs. Consider ID consult.   2.  Labs / imaging needed at time of follow-up: n/a  3.  Pending labs/ test needing follow-up: Hepatitis C Labs  Follow-up Appointments:  Follow-up Information     Haddix, Michael SQUIBB, MD. Schedule an appointment as soon as possible for a visit in 2 week(s).   Specialty: Orthopedic Surgery Why: for wound check and repeat x-rays Contact information: [address] [place] [address] [phone]         [facility].   Contact information: [address] [facility] [place] [address] [phone]                  Hospital Course by problem list: Michael Patton is a 68 y.o. person living with a history of alcohol use disorder, recurrent falls, COPD, untreated hepatitis C, suspected alcohol and Hep C-related  cirrhosis, reported seizure history, who presented with mechanical fall resulting in displaced left femoral neck fracture.  He is status post left total hip arthroplasty now being discharged on hospital day 7 with the following pertinent hospital course:  #Fall #Proximal femoral fracture #Left Elbow wound 2/2 fall Patient is a 68 year old male with a history of alcohol use disorder and falls presenting after an acute impacted fracture of the proximal left femur following a mechanical fall in the setting of chronic alcohol use and recurrent falls. CT head on admission negative for intracranial bleed and CT cervical spine is negative.  Orthopedic surgery was consulted and patient received total left hip arthroplasty.  Postoperatively, stay was complicated by another fall (see below).  Repeat x-ray of hip did not show changes to surgical site.  Pain is well-controlled with Tylenol .  Recommendations per orthopedic surgery included aspirin  81 mg twice daily for DVT prophylaxis, weightbearing as tolerated, elevation and cold therapy. Sent to SNF.   #Fall in hospital Patient had a fall 12/28 AM that resulted in quarter sized bruise on left forehead, bleeding on right hand.  Per notation, night team received page from nursing that he was found on the floor and circumstances surrounding the fall are unclear. Surgical site after fall was noted to be clean, dry, intact without other bruising or injuries observed.  Physical exam 12/28, significant bruising present along the lateral hip, however was nontender to palpation.  No focal deficits noted.  Denied headache, endorsed slight facial jaw pain on left side. Immediately after fall, he was oriented only to name, but during examination later in the day, he was alert and oriented x 3 and appeared back at baseline. CT head unremarkable for acute intracranial abnormality.  Did not appear to have received medication management with Dilaudid  or morphine  12/17.  More likely  that he has hospital-acquired delirium not driven by medication management.   #Hospital-acquired delirium Fall on 12/28 likely 2/2 hospital acquired delirium.  Low suspicion for alcohol withdrawal or medications causes.  On 12/29 around 5:30 PM, on-call team received notification from RN that patient was try to get out of bed stating he saw 3 Joker's that had escaped at the window and saw armed officers as well.  He stated roommate was recording the whole thing.  Patient was not able to be reduced directed and Seroquel  25 mg was added for bedtime.  Patient was amenable to taking this medication.  Unfortunately further on in the night around 3-7 AM, patient was found very confused talking about a young male and young boys in his room trying to start a fire, a ghost, being in a restaurant, being at home, and other police officers.  When assessing the patient bedside around  7 AM, patient was not endorsing hallucinations, alert and oriented to self, location, situation, year and vocalized wanting to leave the hospital due to nobody believing him seeing these things, the medications that he was receiving, and feeling like he could get better help at home. I believe the hallucinations are hospital-acquired delirium as they are waxing and waning and this patient does not have recorded history of previous psychiatric illness or concerns in hospital in chart or per brother. Called patient's brother around 42 on 12/30.  Patient's brother stated that his brother does not have people to care for him at home as he lives in [place] housing with 6 roommates. Patient seen bedside with brother and after discussion of needing SNF, patient agreeable. Ordered nightly seroquel  which patient refused and PRN haldol.   ##Alcohol use disorder #High Risk for Alcohol withdrawal  Chronic alcohol use disorder with daily consumption of approximately 6-7 twelve-ounce beers for the past four years. 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 On admission, the patient was severely acutely intoxicated (blood alcohol level 307 mg/dL).  On admission he denied withdrawal symptoms.  He was placed on CIWA with Ativan .  Received thiamine , folate, multivitamin, Zofran  as needed, Reglan .  #Cirrhotic hepatic morphology with no evidence of focal hepatic lesion #Trace Perry cholecystic fluid/right upper quadrant ascites #Hx of untreated Hep C Cirrhotic hepatic morphology on RUQ ultrasound without focal hepatic lesions, likely secondary to chronic alcohol use and untreated hepatitis C. Likely compensated cirrhosis with MELD score of 8 and no clinical evidence of significant ascites, hepatic encephalopathy, variceal bleeding, or spontaneous bacterial peritonitis. Trace right upper quadrant ascites noted on imaging. Prior plan for EGD/colonoscopy in 2022, unclear if completed.Laboratory studies show AST 94 and ALT 38, consistent with alcohol-induced liver injury. Physical exam shows mild abdominal distension without signs of significant ascites or right upper quadrant tenderness.  Hepatitis C labs ordered prior to discharge.  Encouraged follow-up with primary care doctor.   #Hyponatremia  The patient has a sodium level of 127 on admission.  Chart review suggests he may have been chronically in the 120-130s, though available data only goes back three years. Given his chronic alcohol use, hyponatremia is likely secondary to beer potomania.  Resolved by day of discharge (sodium 141).   Chronic medical conditions: - COPD - Tobacco use disorder   Subjective Patient assessed bedside. No acute concerns. Stated he slept like a baby. No CP, SOB, abdominal pain. Had BM 12/30. No urinary concerns. Slight pain on left hip surgical site after moving to restroom this AM, but had not received tylenol  yet. Understood he is still waiting on SNF placement. Still okay with  SNF.   Discharge Exam:   BP 98/62 (BP Location: Right Arm)   Pulse 94   Temp 99.3 F (37.4 C)   Resp 16   Ht 5' 9 (1.753 m)   Wt 61 kg   SpO2 95%   BMI 19.86 kg/m  Discharge exam:  Physical Exam Constitutional:      General: He is not in acute distress.    Appearance: He is not ill-appearing, toxic-appearing or diaphoretic.  Cardiovascular:     Rate and Rhythm: Normal rate and regular rhythm.     Pulses: Normal pulses.     Heart sounds: Normal heart sounds. No murmur heard.    No friction rub. No gallop.  Pulmonary:     Effort: Pulmonary effort is normal.     Breath  sounds: Normal breath sounds. No stridor. No wheezing, rhonchi or rales.  Abdominal:     Palpations: Abdomen is soft.     Tenderness: There is no abdominal tenderness.  Musculoskeletal:        General: Swelling (left hip. bandaged, no purulence or drainage noted) present. No tenderness (left hip).     Right lower leg: No edema.     Left lower leg: No edema.     Comments: Moving all extremities.  Sensation intact  Skin:    General: Skin is warm and dry.     Findings: Bruising (Present on the left forehead, diffusely over upper extremities, left hip bruising.  Nontender to palpation) present.  Neurological:     General: No focal deficit present.     Mental Status: He is alert and oriented to person, place, and time. Mental status is at baseline.   Pertinent Labs, Studies, and Procedures:     Latest Ref Rng & Units 09/04/2024    3:53 AM 09/03/2024    7:54 AM 09/02/2024    7:48 AM  CBC  WBC 4.0 - 10.5 K/uL 9.1  8.7  8.5   Hemoglobin 13.0 - 17.0 g/dL 88.7  88.9  89.1   Hematocrit 39.0 - 52.0 % 31.7  30.7  29.9   Platelets 150 - 400 K/uL 105  88  69        Latest Ref Rng & Units 09/04/2024    3:53 AM 09/03/2024    7:54 AM 09/02/2024    7:48 AM  CMP  Glucose 70 - 99 mg/dL 91  886  896   BUN 8 - 23 mg/dL 18  18  16    Creatinine 0.61 - 1.24 mg/dL 9.42  9.44  9.48   Sodium 135 - 145 mmol/L 141  137   133   Potassium 3.5 - 5.1 mmol/L 3.6  3.4  3.8   Chloride 98 - 111 mmol/L 107  103  101   CO2 22 - 32 mmol/L 24  24  23    Calcium 8.9 - 10.3 mg/dL 8.3  8.3  8.1     US  Abdomen Limited RUQ (LIVER/GB) Result Date: 08/30/2024 CLINICAL DATA:  Alcohol abuse with withdrawal. EXAM: ULTRASOUND ABDOMEN LIMITED RIGHT UPPER QUADRANT COMPARISON:  None Available. FINDINGS: Gallbladder: Physiologically distended. No gallstones. Mild circumferential wall thickening at 4 mm. Minimal pericholecystic fluid/right upper quadrant ascites. No sonographic Murphy sign noted by sonographer. Common bile duct: Diameter: 3 mm, normal. Liver: Heterogeneous and increased parenchymal echogenicity. Subtle capsular nodularity. No evidence of discrete focal lesion. Portal vein is patent on color Doppler imaging with normal direction of blood flow towards the liver. Other: Trace right upper quadrant ascites. IMPRESSION: 1. Cirrhotic hepatic morphology. No evidence of focal hepatic lesion. 2. Diffuse gallbladder wall thickening is likely related to chronic liver disease. No gallstones. 3. Trace pericholecystic fluid/right upper quadrant ascites. Electronically Signed   By: Andrea Gasman M.D.   On: 08/30/2024 21:46   CT HIP LEFT WO CONTRAST Result Date: 08/30/2024 CLINICAL DATA:  Status post trauma. EXAM: CT OF THE LEFT HIP WITHOUT CONTRAST TECHNIQUE: Multidetector CT imaging of the left hip was performed according to the standard protocol. Multiplanar CT image reconstructions were also generated. RADIATION DOSE REDUCTION: This exam was performed according to the departmental dose-optimization program which includes automated exposure control, adjustment of the mA and/or kV according to patient size and/or use of iterative reconstruction technique. COMPARISON:  None Available. FINDINGS: Bones/Joint/Cartilage Acute, mildly impacted fracture deformity is  seen extending through the head and neck of the proximal left femur. There is no  evidence of dislocation. Degenerative changes are seen in the form of joint space narrowing and acetabular sclerosis. Ligaments Suboptimally assessed by CT. Muscles and Tendons Limited in evaluation and otherwise unremarkable. Soft tissues The urinary bladder is markedly distended and is otherwise unremarkable. Moderate to marked severity prostate gland calcification is seen. IMPRESSION: Acute, mildly impacted fracture of the proximal left femur. Electronically Signed   By: Suzen Dials M.D.   On: 08/30/2024 18:30   CT Cervical Spine Wo Contrast Result Date: 08/30/2024 CLINICAL DATA:  Status post trauma. EXAM: CT CERVICAL SPINE WITHOUT CONTRAST TECHNIQUE: Multidetector CT imaging of the cervical spine was performed without intravenous contrast. Multiplanar CT image reconstructions were also generated. RADIATION DOSE REDUCTION: This exam was performed according to the departmental dose-optimization program which includes automated exposure control, adjustment of the mA and/or kV according to patient size and/or use of iterative reconstruction technique. COMPARISON:  July 25, 2017 FINDINGS: Alignment: There is approximately 1 mm retrolisthesis of the C5 vertebral body on C6. Skull base and vertebrae: No acute fracture. No primary bone lesion or focal pathologic process. Soft tissues and spinal canal: No prevertebral fluid or swelling. No visible canal hematoma. Disc levels: Moderate severity endplate sclerosis, mild anterior osteophyte formation and mild to moderate severity posterior bony spurring are seen at the levels of C5-C6 and C6-C7. There is marked severity intervertebral disc space narrowing at the levels of C5-C6 and C6-C7. Bilateral marked severity multilevel facet joint hypertrophy is noted. Upper chest: Mild biapical scarring and/or atelectasis is seen. Other: None. IMPRESSION: 1. No acute fracture or traumatic subluxation of the cervical spine. 2. Marked severity degenerative changes at the  levels of C5-C6 and C6-C7. Electronically Signed   By: Suzen Dials M.D.   On: 08/30/2024 18:22   CT Head Wo Contrast Result Date: 08/30/2024 CLINICAL DATA:  Status post trauma. EXAM: CT HEAD WITHOUT CONTRAST TECHNIQUE: Contiguous axial images were obtained from the base of the skull through the vertex without intravenous contrast. RADIATION DOSE REDUCTION: This exam was performed according to the departmental dose-optimization program which includes automated exposure control, adjustment of the mA and/or kV according to patient size and/or use of iterative reconstruction technique. COMPARISON:  May 23, 2021 FINDINGS: Brain: There is generalized cerebral atrophy with widening of the extra-axial spaces and ventricular dilatation. There are areas of decreased attenuation within the white matter tracts of the supratentorial brain, consistent with microvascular disease changes. Vascular: Moderate severity bilateral cavernous carotid artery calcification is noted. Skull: Normal. Negative for fracture or focal lesion. Sinuses/Orbits: No acute finding. Other: None. IMPRESSION: 1. Generalized cerebral atrophy and microvascular disease changes of the supratentorial brain. 2. No acute intracranial abnormality. Electronically Signed   By: Suzen Dials M.D.   On: 08/30/2024 18:18   DG Hip Unilat W or Wo Pelvis 2-3 Views Left Result Date: 08/30/2024 CLINICAL DATA:  Status post fall. EXAM: DG HIP (WITH OR WITHOUT PELVIS) 2-3V LEFT COMPARISON:  None Available. FINDINGS: An acute fracture deformity is seen extending through the neck of the proximal left femur. There is no evidence of dislocation. Mild degenerative changes are present in the form of joint space narrowing and acetabular sclerosis. IMPRESSION: Acute fracture of the proximal left femur. Electronically Signed   By: Suzen Dials M.D.   On: 08/30/2024 18:05   DG Chest 2 View Result Date: 08/30/2024 CLINICAL DATA:  Status post fall. EXAM:  DG CHEST 2V  COMPARISON:  November 12, 2020 FINDINGS: The heart size and mediastinal contours are within normal limits. There is moderate severity calcification of the aortic arch. Both lungs are clear. The visualized skeletal structures are unremarkable. IMPRESSION: No active cardiopulmonary disease. Electronically Signed   By: Suzen Dials M.D.   On: 08/30/2024 18:04   DG Elbow Complete Left Result Date: 08/30/2024 CLINICAL DATA:  Syncopal episode with subsequent collapse. EXAM: LEFT ELBOW - COMPLETE 3+ VIEW COMPARISON:  None Available. FINDINGS: There is no evidence of fracture, dislocation, or joint effusion. There is no evidence of arthropathy or other focal bone abnormality. Soft tissues are unremarkable. IMPRESSION: Negative. Electronically Signed   By: Suzen Dials M.D.   On: 08/30/2024 18:03     Discharge Instructions: Discharge Instructions     Call MD for:  persistant nausea and vomiting   Complete by: As directed    Call MD for:  redness, tenderness, or signs of infection (pain, swelling, redness, odor or green/yellow discharge around incision site)   Complete by: As directed    Call MD for:  severe uncontrolled pain   Complete by: As directed    Call MD for:  temperature >100.4   Complete by: As directed    Change dressing (specify)   Complete by: As directed    Dressing change: once per day   Diet general   Complete by: As directed    Increase activity slowly   Complete by: As directed        Signed: Harrie Bruckner, DO 09/06/2024, 12:44 PM   "

## 2024-09-06 DIAGNOSIS — F1090 Alcohol use, unspecified, uncomplicated: Secondary | ICD-10-CM | POA: Diagnosis not present

## 2024-09-06 DIAGNOSIS — K7460 Unspecified cirrhosis of liver: Secondary | ICD-10-CM

## 2024-09-06 DIAGNOSIS — E871 Hypo-osmolality and hyponatremia: Secondary | ICD-10-CM

## 2024-09-06 DIAGNOSIS — S7292XA Unspecified fracture of left femur, initial encounter for closed fracture: Secondary | ICD-10-CM

## 2024-09-06 DIAGNOSIS — R443 Hallucinations, unspecified: Secondary | ICD-10-CM | POA: Diagnosis not present

## 2024-09-06 DIAGNOSIS — W19XXXA Unspecified fall, initial encounter: Secondary | ICD-10-CM | POA: Diagnosis not present

## 2024-09-06 MED ORDER — ADULT MULTIVITAMIN W/MINERALS CH: 1.0000 | ORAL_TABLET | Freq: Every day | ORAL | Status: DC

## 2024-09-06 MED ORDER — SENNA 8.6 MG PO TABS: 1.0000 | ORAL_TABLET | Freq: Two times a day (BID) | ORAL | Status: DC

## 2024-09-06 MED ORDER — VITAMIN D (ERGOCALCIFEROL) 1.25 MG (50000 UNIT) PO CAPS: 50000.0000 [IU] | ORAL_CAPSULE | ORAL | Status: DC

## 2024-09-06 MED ORDER — LIDOCAINE 5 % EX PTCH: 1.0000 | MEDICATED_PATCH | CUTANEOUS | Status: DC

## 2024-09-06 MED ORDER — ASPIRIN 81 MG PO CHEW: 81.0000 mg | CHEWABLE_TABLET | Freq: Two times a day (BID) | ORAL | Status: DC

## 2024-09-06 MED ORDER — FOLIC ACID 1 MG PO TABS: 1.0000 mg | ORAL_TABLET | Freq: Every day | ORAL | Status: AC

## 2024-09-06 MED ORDER — POLYETHYLENE GLYCOL 3350 17 G PO PACK: 17.0000 g | PACK | Freq: Two times a day (BID) | ORAL | Status: DC

## 2024-09-06 MED ORDER — NICOTINE 21 MG/24HR TD PT24: 21.0000 mg | MEDICATED_PATCH | Freq: Every day | TRANSDERMAL | Status: AC

## 2024-09-06 MED ORDER — VITAMIN B-1 100 MG PO TABS: 100.0000 mg | ORAL_TABLET | Freq: Every day | ORAL | Status: DC

## 2024-09-06 NOTE — Progress Notes (Addendum)
 DME order placed for ongoing debility for femur fracture s/p total left hip arthroplasty.  - DME rolling walker ordered - DME wheelchair - bed side commode ordered

## 2024-09-06 NOTE — Progress Notes (Signed)
 Slept the entire night. No hallucinations reported overnight. No trying to get out of bed.

## 2024-09-06 NOTE — TOC Progression Note (Addendum)
 Transition of Care (TOC) - Progression Note   Spoke to patient at bedside regarding rehab at SNF vs home with home health. Patient stated he spoke with his brother and has  decided on short term rehab at a SNF at discharge. Team updated   1355 Patient on sex offender list therefore cannot go to a SNF or have home health services. Patient lives in a rooming house and has no assistance at home. Patient will call his brother [name] who lives in [place] to see if patient can stay with him at discharge. PT currently working with patient. Patient will need rolling walker and bedside commode . Asked for orders and progress note    Patient reports he has medicare but does not have medicare information . Beverly in business office is verifying . Medicare information added to [facility] . Patient stated OK to bill medicare for DME   Attending team aware orders and DME note needed  1400 [name] with [employer] called for DME walker and 3 in 1.   PT recommending patient may need wheelchair. Insurance only covers walker or wheelchair at one time not both. PT suggesting patient needs walker most at first. NCM can order wheelchair through [place]. Takes 5 to 10 business days to get DME through [place] . Team and patient aware. Asked for order . Wheelchair order and clinicals emailed to [place]  Patient Details  Name: Michael Patton MRN: [id number] Date of Birth: May 20, 1956  Transition of Care [facility]) CM/SW Contact  Ramonita Koenig, Powell Jansky, RN Phone Number: 09/06/2024, 8:58 AM  Clinical Narrative:       Expected Discharge Plan: Skilled Nursing Facility Barriers to Discharge: Continued Medical Work up, SNF Pending bed offer               Expected Discharge Plan and Services       Living arrangements for the past 2 months: Skilled Nursing Facility                                       Social Drivers of Health (SDOH) Interventions SDOH Screenings   Food Insecurity: No Food Insecurity (09/02/2024)   Housing: Low Risk (09/02/2024)  Transportation Needs: Unmet Transportation Needs (09/02/2024)  Utilities: Not At Risk (09/02/2024)  Social Connections: Socially Isolated (09/02/2024)  Tobacco Use: High Risk (08/30/2024)    Readmission Risk Interventions     No data to display

## 2024-09-06 NOTE — Progress Notes (Addendum)
 "  HD#7 SUBJECTIVE:  Patient Summary: Michael Patton is a 68 y.o. with a pertinent PMH of alcohol use disorder, recurrent falls, COPD, untreated hepatitis C, suspected alcohol and Hep C-related cirrhosis, reported seizure history, who presented with mechanical fall resulting in displaced left femoral neck fracture.  He is now status post left total hip arthroplasty pending SNF.   Overnight Events: no overnight events.   Interim History:  Patient assessed bedside. No acute concerns. Stated he slept like a baby. No CP, SOB, abdominal pain. Had BM 12/30. No urinary concerns. Slight pain on left hip surgical site after moving to restroom this AM, but had not received tylenol  yet. Understood he is still waiting on SNF placement. Still okay with SNF.   OBJECTIVE:  Vital Signs: Vitals:   09/05/24 0734 09/05/24 1512 09/05/24 1942 09/06/24 0733  BP: (!) 85/61 112/70 131/84 98/62  Pulse: (!) 106 95 90 94  Resp: 16 18 18 16   Temp: 98.4 F (36.9 C) 98.4 F (36.9 C) 98 F (36.7 C) 99.3 F (37.4 C)  TempSrc:   Oral   SpO2: 97% 94% 97% 95%  Weight:      Height:       Supplemental O2: Room Air SpO2: 95 % O2 Flow Rate (L/min):    Filed Weights   08/30/24 1722  Weight: 61 kg     Intake/Output Summary (Last 24 hours) at 09/06/2024 1035 Last data filed at 09/06/2024 0700 Gross per 24 hour  Intake 480 ml  Output 200 ml  Net 280 ml   Net IO Since Admission: 1,131.33 mL [09/06/24 1035]  Physical Exam: Physical Exam Constitutional:      General: He is not in acute distress.    Appearance: He is not ill-appearing, toxic-appearing or diaphoretic.  Cardiovascular:     Rate and Rhythm: Normal rate and regular rhythm.     Pulses: Normal pulses.     Heart sounds: Normal heart sounds. No murmur heard.    No friction rub. No gallop.  Pulmonary:     Effort: Pulmonary effort is normal.     Breath sounds: Normal breath sounds. No stridor. No wheezing, rhonchi or rales.  Abdominal:      Palpations: Abdomen is soft.     Tenderness: There is no abdominal tenderness.  Musculoskeletal:        General: Swelling (left hip. bandaged, no purulence or drainage noted) present. No tenderness (left hip).     Right lower leg: No edema.     Left lower leg: No edema.     Comments: Moving all extremities.  Sensation intact  Skin:    General: Skin is warm and dry.     Findings: Bruising (Present on the left forehead, diffusely over upper extremities, left hip bruising.  Nontender to palpation) present.  Neurological:     General: No focal deficit present.     Mental Status: He is alert and oriented to person, place, and time. Mental status is at baseline.     Patient Lines/Drains/Airways Status     Active Line/Drains/Airways     Name Placement date Placement time Site Days   Wound 09/01/24 0949 Surgical Closed Surgical Incision Hip Left 09/01/24  0949  Hip  5            Pertinent labs and imaging:      Latest Ref Rng & Units 09/04/2024    3:53 AM 09/03/2024    7:54 AM 09/02/2024    7:48 AM  CBC  WBC 4.0 - 10.5 K/uL 9.1  8.7  8.5   Hemoglobin 13.0 - 17.0 g/dL 88.7  88.9  89.1   Hematocrit 39.0 - 52.0 % 31.7  30.7  29.9   Platelets 150 - 400 K/uL 105  88  69        Latest Ref Rng & Units 09/04/2024    3:53 AM 09/03/2024    7:54 AM 09/02/2024    7:48 AM  CMP  Glucose 70 - 99 mg/dL 91  886  896   BUN 8 - 23 mg/dL 18  18  16    Creatinine 0.61 - 1.24 mg/dL 9.42  9.44  9.48   Sodium 135 - 145 mmol/L 141  137  133   Potassium 3.5 - 5.1 mmol/L 3.6  3.4  3.8   Chloride 98 - 111 mmol/L 107  103  101   CO2 22 - 32 mmol/L 24  24  23    Calcium 8.9 - 10.3 mg/dL 8.3  8.3  8.1     No results found.   ASSESSMENT/PLAN:  Assessment: Principal Problem:   Closed left femoral fracture (HCC) Active Problems:   Tobacco abuse   Alcohol use disorder, severe, dependence (HCC)   Alcoholic cirrhosis of liver with ascites (HCC)   COPD (chronic obstructive pulmonary disease)  (HCC)   Chronic hepatitis C (HCC)   Fall   Acute delirium   Osteoporosis with current pathological fracture   Vitamin D  deficiency   Plan: #Left femoral neck fracture s/p left total hip arthroplasty Surgery 12/26.  Postop day 5. Passing gas. Reports bowel movement this 12/30.  Nontender to palpation. Some swelling/edema around surgical site noted. Bandage without purulence or drainage.  - Weightbearing as tolerated to left lower extremity - DVT prophylaxis: Aspirin  81 mg twice daily - Bowel regimen: MiraLAX , senna twice daily - Pain control: Tylenol  500 mg 4 times daily, Tylenol  650 mg every 6 hours as needed, lidocaine  patch - Incentive spirometry - Elevate and apply cold therapy. - Follow-up in 2 weeks post-op with Dr. Kendal  # Suspected hospital-acquired delirium - resolved # Fall in hospital No overnight events. Patient stated he slept well last night. Refused seroquel  but did not require haldol.  - Seroquel  50 mg nightly -- Haldol 1 mg IV or IM or Tablet PRN for agitation -- encouraged patient's brother to have family and friends visit during the day to help with delirium.   # AUD No signs of withdrawal during hospitalization.  - Thiamine  100 mg daily - Folate 1 mg daily - Multivitamin 1 tablet daily - Reglan  5-10 mg every 8 hours - Zofran  4 mg every 8 hours as needed  #Cirrhotic hepatic morphology #Trace RUQ ascites #Chronic untreated hep C Imaging demonstrated cirrhotic hepatic morphology without focal lesions and trace right upper quadrant ascites.  Likely 2/2 chronic alcohol use and untreated hep C.  MELD score 8.  No evidence of variceal bleeding, SBP, significant ascites.  Labs notable for AST > ALT consistent with alcohol related liver injury. - Hepatitis C labs have been ordered - Can follow-up outpatient with primary care at [facility] regarding results  #Osteoporosis - Ergocalciferol  1.25 mg q. 7 days  #Chronic medical conditions COPD - Stable Tobacco use  disorder - Nicotine  patch 21 mg daily  Best Practice: Diet: Regular diet IVF: Fluids: None, Rate: None VTE: SCDs Start: 09/01/24 1308 Place TED hose Start: 09/01/24 1308 ASA 81 mg BID Code: Full  Disposition planning: DISPO: SNF  Signature:  Dr. Mallori Araque  Shemuel Harkleroad, DO [facility]  10:35 AM, 09/06/2024  On Call pager [phone]  "

## 2024-09-06 NOTE — TOC Progression Note (Addendum)
 Transition of Care [facility]) - Progression Note    Patient Details  Name: SIDDHARTHA HOBACK MRN: [id number] Date of Birth: 05-08-1956  [facility] CM/SW Contact  Bridget Cordella Simmonds, LCSW Phone Number: 09/06/2024, 12:18 PM  Clinical Narrative:   SNF bed offers provided to pt on medicare choice document.  Pt will review.  1235: CSW spoke with pt: he will accept offer at [facility].  CSW spoke with [facility]: they can receive pt today if can get DC summary by 3pm.  MD team informed.   1315: TC [facility]: pt is on offender registry.  They cannot accept him based on this.  MD team updated.  Expected Discharge Plan: Skilled Nursing Facility Barriers to Discharge: Continued Medical Work up, SNF Pending bed offer               Expected Discharge Plan and Services       Living arrangements for the past 2 months: Skilled Nursing Facility                                       Social Drivers of Health (SDOH) Interventions SDOH Screenings   Food Insecurity: No Food Insecurity (09/02/2024)  Housing: Low Risk (09/02/2024)  Transportation Needs: Unmet Transportation Needs (09/02/2024)  Utilities: Not At Risk (09/02/2024)  Social Connections: Socially Isolated (09/02/2024)  Tobacco Use: High Risk (08/30/2024)    Readmission Risk Interventions     No data to display

## 2024-09-06 NOTE — Progress Notes (Signed)
 Physical Therapy Treatment Patient Details Name: Michael Patton MRN: [id number] DOB: August 02, 1956 Today's Date: 09/06/2024   History of Present Illness 68 y.o. male presented to ED 08/30/24 with left femoral neck fracture from a fall.  Underwant L THA on 09/01/24.   PMH significant for but ot limited to: chronic hepatitis C untreated, COPD, chronic alcohol abuse.    PT Comments  Continuing work on functional mobility and activity tolerance;  Session focused on pt's own stated goal of progressive amb, with solid progress; Participated in 2 bouts of amb -- 15 feet total with one seated rest break; Good use of RW to unweigh painful LLE in stance; Informed by TOC during session that his situation necessitates dc home to family -- states he thinks he can go to his brother's place; While it is not optimal, going to his brother's home is do-able;   Appreciate TOC and orders for DME   If plan is discharge home, recommend the following: A lot of help with walking and/or transfers;A lot of help with bathing/dressing/bathroom   Can travel by private vehicle        Equipment Recommendations  Rolling walker (2 wheels);BSC/3in1;Wheelchair (measurements PT)    Recommendations for Other Services       Precautions / Restrictions Precautions Precautions: Fall Recall of Precautions/Restrictions: Impaired Precaution/Restrictions Comments: ciwa; seems to be improving daily Restrictions LLE Weight Bearing Per Provider Order: Weight bearing as tolerated     Mobility  Bed Mobility Overal bed mobility: Needs Assistance Bed Mobility: Supine to Sit     Supine to sit: Min assist     General bed mobility comments: Cues for technqiue    Transfers Overall transfer level: Needs assistance Equipment used: Rolling walker (2 wheels) Transfers: Sit to/from Stand Sit to Stand: Mod assist           General transfer comment: Light mod assist to power up adn steay RW     Ambulation/Gait Ambulation/Gait assistance: Min assist, +2 safety/equipment (chair follow) Gait Distance (Feet): 15 Feet (2 bouts of 6/7 feet each) Assistive device: Rolling walker (2 wheels) Gait Pattern/deviations: Decreased stride length, Wide base of support, Shuffle Gait velocity: slow     General Gait Details: Cues for gait sequence; good use of RW to unweigh painful LLE in stance   Stairs             Wheelchair Mobility     Tilt Bed    Modified Rankin (Stroke Patients Only)       Balance Overall balance assessment: Needs assistance   Sitting balance-Leahy Scale: Good       Standing balance-Leahy Scale: Poor                              Communication Communication Communication: No apparent difficulties  Cognition Arousal: Alert Behavior During Therapy: WFL for tasks assessed/performed   PT - Cognitive impairments: No apparent impairments                         Following commands: Intact      Cueing Cueing Techniques: Verbal cues  Exercises      General Comments General comments (skin integrity, edema, etc.): Very much improved mentation, and able to participate in problem-solving related to his situation necesitating dc to brother's home      Pertinent Vitals/Pain Pain Assessment Pain Assessment: Faces Faces Pain Scale: Hurts whole lot Pain Location: L hip  area with weight bearing Pain Descriptors / Indicators: Grimacing, Guarding Pain Intervention(s): Monitored during session    Home Living                          Prior Function            PT Goals (current goals can now be found in the care plan section) Acute Rehab PT Goals Patient Stated Goal: To return home Progress towards PT goals: Progressing toward goals    Frequency    Min 3X/week      PT Plan      Co-evaluation              AM-PAC PT 6 Clicks Mobility   Outcome Measure  Help needed turning from your back to  your side while in a flat bed without using bedrails?: A Little Help needed moving from lying on your back to sitting on the side of a flat bed without using bedrails?: A Little Help needed moving to and from a bed to a chair (including a wheelchair)?: A Little Help needed standing up from a chair using your arms (e.g., wheelchair or bedside chair)?: A Little Help needed to walk in hospital room?: A Lot Help needed climbing 3-5 steps with a railing? : Total 6 Click Score: 15    End of Session Equipment Utilized During Treatment: Gait belt Activity Tolerance: Patient tolerated treatment well Patient left: in chair;with call bell/phone within reach;with chair alarm set Nurse Communication: Mobility status PT Visit Diagnosis: Unsteadiness on feet (R26.81)     Time: 8653-8588 PT Time Calculation (min) (ACUTE ONLY): 25 min  Charges:    $Gait Training: 8-22 mins $Therapeutic Activity: 8-22 mins PT General Charges $$ ACUTE PT VISIT: 1 Visit                     Silvano Currier, PT  Acute Rehabilitation Services Office [phone] Secure Chat welcomed     Silvano VEAR Currier 09/06/2024, 4:03 PM

## 2024-09-07 LAB — HCV RNA QUANT RFLX ULTRA OR GENOTYP
HCV RNA Qnt(log copy/mL): UNDETERMINED {Log_IU}/mL
HepC Qn: NOT DETECTED [IU]/mL

## 2024-09-07 MED ORDER — NAPHAZOLINE-GLYCERIN 0.012-0.25 % OP SOLN
1.0000 [drp] | Freq: Four times a day (QID) | OPHTHALMIC | Status: DC | PRN
  Filled 2024-09-07: qty 15

## 2024-09-07 NOTE — Progress Notes (Addendum)
 "  HD#8 SUBJECTIVE:  Patient Summary: Michael Patton is a 69 y.o. with a pertinent PMH of alcohol use disorder, recurrent falls, COPD, untreated hepatitis C, suspected alcohol and Hep C-related cirrhosis, reported seizure history, who presented with mechanical fall resulting in displaced left femoral neck fracture.  He is now status post left total hip arthroplasty pending home dispo.   Overnight Events: no overnight events.   Interim History:  Patient assessed bedside.  Denies chest pain, shortness of breath, abdominal pain.  Stated that he is having bowel movements.  Complaining of slight soreness in legs after working with physical therapy.  No new change in left hip pain.  Patient aware that he will be discharged home or to brother's house.  Patient stated that he would call brother and give us  update later today.  OBJECTIVE:  Vital Signs: Vitals:   09/06/24 0733 09/06/24 2013 09/07/24 0302 09/07/24 0718  BP: 98/62 (!) 142/76 135/79 106/68  Pulse: 94 95 72 85  Resp: 16 18 18 17   Temp: 99.3 F (37.4 C) 98.4 F (36.9 C) 98.3 F (36.8 C) 98.9 F (37.2 C)  TempSrc:  Oral Oral Oral  SpO2: 95% 98% 98% 96%  Weight:      Height:       Supplemental O2: Room Air SpO2: 95 % O2 Flow Rate (L/min):    Filed Weights   08/30/24 1722  Weight: 61 kg     Intake/Output Summary (Last 24 hours) at 09/07/2024 0849 Last data filed at 09/07/2024 0700 Gross per 24 hour  Intake --  Output 700 ml  Net -700 ml   Net IO Since Admission: 431.33 mL [09/07/24 0849]  Physical Exam: Physical Exam Constitutional:      General: He is not in acute distress.    Appearance: He is not ill-appearing, toxic-appearing or diaphoretic.     Comments: Laying comfortably in bed  Cardiovascular:     Rate and Rhythm: Normal rate and regular rhythm.     Heart sounds: Normal heart sounds. No murmur heard.    No friction rub. No gallop.  Pulmonary:     Effort: Pulmonary effort is normal.     Breath sounds:  Normal breath sounds. No stridor. No wheezing, rhonchi or rales.  Abdominal:     Palpations: Abdomen is soft.     Tenderness: There is no abdominal tenderness.  Musculoskeletal:        General: Swelling (left hip. bandaged, no purulence or drainage noted) present. No tenderness (left hip).     Right lower leg: No edema.     Left lower leg: No edema.     Comments: Moving all extremities.  Sensation intact.    Skin:    General: Skin is warm and dry.     Findings: Bruising (diffusely over upper extremities, left hip bruising.  Nontender to palpation) present.  Neurological:     General: No focal deficit present.     Mental Status: He is alert and oriented to person, place, and time. Mental status is at baseline.     Patient Lines/Drains/Airways Status     Active Line/Drains/Airways     Name Placement date Placement time Site Days   Wound 09/01/24 0949 Surgical Closed Surgical Incision Hip Left 09/01/24  0949  Hip  6            Pertinent labs and imaging:      Latest Ref Rng & Units 09/04/2024    3:53 AM 09/03/2024  7:54 AM 09/02/2024    7:48 AM  CBC  WBC 4.0 - 10.5 K/uL 9.1  8.7  8.5   Hemoglobin 13.0 - 17.0 g/dL 88.7  88.9  89.1   Hematocrit 39.0 - 52.0 % 31.7  30.7  29.9   Platelets 150 - 400 K/uL 105  88  69        Latest Ref Rng & Units 09/04/2024    3:53 AM 09/03/2024    7:54 AM 09/02/2024    7:48 AM  CMP  Glucose 70 - 99 mg/dL 91  886  896   BUN 8 - 23 mg/dL 18  18  16    Creatinine 0.61 - 1.24 mg/dL 9.42  9.44  9.48   Sodium 135 - 145 mmol/L 141  137  133   Potassium 3.5 - 5.1 mmol/L 3.6  3.4  3.8   Chloride 98 - 111 mmol/L 107  103  101   CO2 22 - 32 mmol/L 24  24  23    Calcium 8.9 - 10.3 mg/dL 8.3  8.3  8.1     No results found.   ASSESSMENT/PLAN:  Assessment: Principal Problem:   Closed left femoral fracture (HCC) Active Problems:   Tobacco abuse   Alcohol use disorder, severe, dependence (HCC)   Alcoholic cirrhosis of liver with ascites  (HCC)   COPD (chronic obstructive pulmonary disease) (HCC)   Chronic hepatitis C (HCC)   Fall   Acute delirium   Osteoporosis with current pathological fracture   Vitamin D  deficiency   Plan: #Left femoral neck fracture s/p left total hip arthroplasty Surgery 12/26.  Postop day 6.  Reports bowel movements.  No change in bruising or swelling to left hip.  Nontender to palpation.  Continues to work well with PT (2 bouts of ambulation 15 feet total with 1 seated rest break.  Patient will need to be discharged home.  DME for wheelchair, walker, and bedside commode ordered.  Patient to call brother to see if he can live with him for short while while he continues to recover.  Otherwise he will be discharged to his home at the [place] housing. - DME for wheelchair, walker, bedside commode ordered - Weightbearing as tolerated to left lower extremity - DVT prophylaxis: Aspirin  81 mg twice daily - Bowel regimen: MiraLAX , senna twice daily - Pain control: Tylenol  500 mg 4 times daily, Tylenol  650 mg every 6 hours as needed, lidocaine  patch - Incentive spirometry - Elevate and apply cold therapy. - Follow-up in 2 weeks post-op with Dr. Kendal  # Suspected hospital-acquired delirium - resolved # Fall in hospital No overnight events. - Seroquel  50 mg nightly -- Haldol 1 mg IV or IM or Tablet PRN for agitation -- encouraged patient's brother to have family and friends visit during the day to help with delirium.   # AUD No signs of withdrawal during hospitalization.  - Thiamine  100 mg daily - Folate 1 mg daily - Multivitamin 1 tablet daily - Reglan  5-10 mg every 8 hours - Zofran  4 mg every 8 hours as needed  #Cirrhotic hepatic morphology #Trace RUQ ascites #Chronic untreated hep C Imaging demonstrated cirrhotic hepatic morphology without focal lesions and trace right upper quadrant ascites.  Likely 2/2 chronic alcohol use and untreated hep C.  MELD score 8.  No evidence of variceal bleeding, SBP,  significant ascites.  Labs notable for AST > ALT consistent with alcohol related liver injury. - Hepatitis C labs pending - Can follow-up outpatient with primary care  at [facility] regarding results  #Osteoporosis - Ergocalciferol  1.25 mg q. 7 days  #Chronic medical conditions COPD - Stable Tobacco use disorder - Nicotine  patch 21 mg daily  Best Practice: Diet: Regular diet IVF: Fluids: None, Rate: None VTE: SCDs Start: 09/01/24 1308 Place TED hose Start: 09/01/24 1308 ASA 81 mg BID Code: Full  Disposition planning: DISPO: Home  Signature:  Dr. Sallyanne Primas, DO [facility]  8:49 AM, 09/07/2024  On Call pager [phone]  "

## 2024-09-07 NOTE — Progress Notes (Signed)
 Wound care completed

## 2024-09-07 NOTE — Progress Notes (Signed)
 Physical Therapy Treatment Patient Details Name: Michael Patton MRN: [id number] DOB: 11/10/1955 Today's Date: 09/07/2024   History of Present Illness 69 y.o. male presented to ED 08/30/24 with left femoral neck fracture from a fall.  Underwant L THA on 09/01/24.   PMH significant for but ot limited to: chronic hepatitis C untreated, COPD, chronic alcohol abuse.    PT Comments  Pt received in supine, agreeable to therapy session with emphasis on transfer and gait progression with RW support. Pt needing up to CGA to perform all functional mobility tasks this date, but remains heavily reliant on bed rails to perform bed mobility, less assist for transfers/gait needed this session. Pt able to perform short household distance gait trial but needing seated break in chair prior to returning to room due to c/o pain/fatigue. HR to ~120 bpm with exertion, SpO2 WFL on RA. Patient will benefit from continued inpatient follow up therapy, <3 hours/day, however since per chart review this may be unavailable, recommend HH or OP follow-up as available to progress safe functional mobility once home with family support. Pt reports his brother's home has a ramp for entry.     If plan is discharge home, recommend the following: A lot of help with walking and/or transfers;A lot of help with bathing/dressing/bathroom;Assistance with cooking/housework;Assist for transportation;Help with stairs or ramp for entrance   Can travel by private vehicle        Equipment Recommendations  Rolling walker (2 wheels);BSC/3in1;Wheelchair (measurements PT)    Recommendations for Other Services       Precautions / Restrictions Precautions Precautions: Fall Recall of Precautions/Restrictions: Impaired Precaution/Restrictions Comments: CIWA but improving Restrictions Weight Bearing Restrictions Per Provider Order: Yes LLE Weight Bearing Per Provider Order: Weight bearing as tolerated     Mobility  Bed Mobility Overal bed  mobility: Needs Assistance Bed Mobility: Supine to Sit     Supine to sit: Contact guard, Used rails     General bed mobility comments: CGA for safety when pt has access to bilateral bed rails; without rails, anticipate pt would need HHA and trunk lift assist to reach EOB.    Transfers Overall transfer level: Needs assistance Equipment used: Rolling walker (2 wheels) Transfers: Sit to/from Stand Sit to Stand: Min assist           General transfer comment: from EOB<>RW x2 reps and RW>chair, cues for safe UE placement prior to performing.    Ambulation/Gait Ambulation/Gait assistance: +2 safety/equipment, Contact guard assist (chair follow) Gait Distance (Feet): 24 Feet Assistive device: Rolling walker (2 wheels) Gait Pattern/deviations: Decreased stride length, Shuffle, Step-to pattern, Decreased step length - right Gait velocity: grossly <0.2 m/s, frequent standing rest breaks Gait velocity interpretation: <1.31 ft/sec, indicative of household ambulator   General Gait Details: Cues for gait sequence; good use of RW to unweigh painful LLE in stance, cues to avoid LLE hip IR and try pointing L toe forward, as well as longer L step length for step-to pattern for energy conservation; chair follow for safety, pt was hopeful to return to room prior to sitting, but with c/o fatigue after ~70ft, so chair brought closer behind him to sit. Pt agreeable to sit up in chair post-exertion, alarm on for safety.   Stairs Stairs:  (verbal discussion of sequencing/safety in case he has to step up into an SUV type vehicle, but he does have ramp to enter his brother's home per patient report)           Wheelchair Mobility  Tilt Bed    Modified Rankin (Stroke Patients Only)       Balance Overall balance assessment: Needs assistance Sitting-balance support: No upper extremity supported, Feet supported Sitting balance-Leahy Scale: Good     Standing balance support: Bilateral  upper extremity supported, Reliant on assistive device for balance Standing balance-Leahy Scale: Poor Standing balance comment: relies on RW for dynamic tasks. fair static standing briefly unsupported, but no challenge given                            Communication Communication Communication: No apparent difficulties  Cognition Arousal: Alert Behavior During Therapy: WFL for tasks assessed/performed   PT - Cognitive impairments: No apparent impairments                         Following commands: Intact      Cueing Cueing Techniques: Verbal cues  Exercises Other Exercises Other Exercises: reviewed seated BLE AROM: ankle pumps, LAQ x10 reps, one leg at a time, pt reports he has been working on this.    General Comments General comments (skin integrity, edema, etc.): HR to 120 bpm with exertion SpO2 WFL on RA. HR decreased with rest break.      Pertinent Vitals/Pain Pain Assessment Pain Assessment: 0-10 Pain Score: 7  Pain Location: L hip area with weight bearing Pain Descriptors / Indicators: Grimacing, Guarding Pain Intervention(s): Limited activity within patient's tolerance, Monitored during session, Repositioned    Home Living                          Prior Function            PT Goals (current goals can now be found in the care plan section) Acute Rehab PT Goals Patient Stated Goal: To return home Progress towards PT goals: Progressing toward goals    Frequency    Min 3X/week      PT Plan      Co-evaluation              AM-PAC PT 6 Clicks Mobility   Outcome Measure  Help needed turning from your back to your side while in a flat bed without using bedrails?: A Little Help needed moving from lying on your back to sitting on the side of a flat bed without using bedrails?: A Lot (w/o rail) Help needed moving to and from a bed to a chair (including a wheelchair)?: A Little Help needed standing up from a chair using  your arms (e.g., wheelchair or bedside chair)?: A Little Help needed to walk in hospital room?: A Lot Help needed climbing 3-5 steps with a railing? : Total 6 Click Score: 14    End of Session Equipment Utilized During Treatment: Gait belt Activity Tolerance: Patient tolerated treatment well;Patient limited by pain Patient left: in chair;with call bell/phone within reach;with chair alarm set (pt requests recliner legs down) Nurse Communication: Mobility status PT Visit Diagnosis: Unsteadiness on feet (R26.81)     Time: 8447-8388 PT Time Calculation (min) (ACUTE ONLY): 19 min  Charges:    $Gait Training: 8-22 mins PT General Charges $$ ACUTE PT VISIT: 1 Visit                     Sundeep Cary P., PTA Acute Rehabilitation Services Secure Chat Preferred 9a-5:30pm Office: [phone]    Connell HERO [facility] 09/07/2024, 4:25 PM

## 2024-09-07 NOTE — TOC Progression Note (Signed)
 Transition of Care (TOC) - Progression Note   Bedside commode and rolling walker for home at bedside. Ordered wheelchair through [facility].   Patient stated he can stay with his brother [name] address: [address] [place], [place] . [name] can provide transportation home at discharge. However, [name] cannot take him until Monday . Patient has no other place he can stay. Updated team  Patient Details  Name: Michael Patton MRN: [id number] Date of Birth: 08/31/1956  Transition of Care [facility]) CM/SW Contact  Ozell Juhasz, Powell Jansky, RN Phone Number: 09/07/2024, 1:14 PM  Clinical Narrative:       Expected Discharge Plan: Skilled Nursing Facility Barriers to Discharge: Continued Medical Work up, SNF Pending bed offer               Expected Discharge Plan and Services       Living arrangements for the past 2 months: Skilled Nursing Facility Expected Discharge Date: 09/06/24                                     Social Drivers of Health (SDOH) Interventions SDOH Screenings   Food Insecurity: No Food Insecurity (09/02/2024)  Housing: Low Risk (09/02/2024)  Transportation Needs: Unmet Transportation Needs (09/02/2024)  Utilities: Not At Risk (09/02/2024)  Social Connections: Socially Isolated (09/02/2024)  Tobacco Use: High Risk (08/30/2024)    Readmission Risk Interventions     No data to display

## 2024-09-07 NOTE — Plan of Care (Signed)
   Problem: Education: Goal: Knowledge of General Education information will improve Description Including pain rating scale, medication(s)/side effects and non-pharmacologic comfort measures Outcome: Progressing   Problem: Health Behavior/Discharge Planning: Goal: Ability to manage health-related needs will improve Outcome: Progressing

## 2024-09-08 ENCOUNTER — Other Ambulatory Visit (HOSPITAL_COMMUNITY): Payer: Self-pay

## 2024-09-08 DIAGNOSIS — R410 Disorientation, unspecified: Secondary | ICD-10-CM

## 2024-09-08 DIAGNOSIS — M8000XA Age-related osteoporosis with current pathological fracture, unspecified site, initial encounter for fracture: Secondary | ICD-10-CM | POA: Diagnosis not present

## 2024-09-08 DIAGNOSIS — K739 Chronic hepatitis, unspecified: Secondary | ICD-10-CM | POA: Diagnosis not present

## 2024-09-08 DIAGNOSIS — S7292XA Unspecified fracture of left femur, initial encounter for closed fracture: Secondary | ICD-10-CM | POA: Diagnosis not present

## 2024-09-08 DIAGNOSIS — F1090 Alcohol use, unspecified, uncomplicated: Secondary | ICD-10-CM | POA: Diagnosis not present

## 2024-09-08 DIAGNOSIS — E871 Hypo-osmolality and hyponatremia: Secondary | ICD-10-CM | POA: Diagnosis not present

## 2024-09-08 DIAGNOSIS — J449 Chronic obstructive pulmonary disease, unspecified: Secondary | ICD-10-CM | POA: Diagnosis not present

## 2024-09-08 DIAGNOSIS — K7460 Unspecified cirrhosis of liver: Secondary | ICD-10-CM | POA: Diagnosis not present

## 2024-09-08 DIAGNOSIS — W19XXXA Unspecified fall, initial encounter: Secondary | ICD-10-CM | POA: Diagnosis not present

## 2024-09-08 LAB — HCV AB W REFLEX TO QUANT PCR: HCV Ab: REACTIVE — AB

## 2024-09-08 LAB — HCV RT-PCR, QUANT (NON-GRAPH): Hepatitis C Quantitation: NOT DETECTED [IU]/mL

## 2024-09-08 MED ORDER — ASPIRIN 81 MG PO CHEW
81.0000 mg | CHEWABLE_TABLET | Freq: Two times a day (BID) | ORAL | 0 refills | Status: DC
  Filled 2024-09-08: qty 60, 30d supply, fill #0

## 2024-09-08 MED ORDER — ADULT MULTIVITAMIN W/MINERALS CH
1.0000 | ORAL_TABLET | Freq: Every day | ORAL | 0 refills | Status: AC
  Filled 2024-09-08: qty 30, 30d supply, fill #0

## 2024-09-08 MED ORDER — THIAMINE HCL 100 MG PO TABS
100.0000 mg | ORAL_TABLET | Freq: Every day | ORAL | 0 refills | Status: AC
  Filled 2024-09-08: qty 30, 30d supply, fill #0

## 2024-09-08 MED ORDER — ASPIRIN 81 MG PO CHEW
81.0000 mg | CHEWABLE_TABLET | Freq: Two times a day (BID) | ORAL | 0 refills | Status: AC
  Filled 2024-09-08: qty 50, 25d supply, fill #0

## 2024-09-08 MED ORDER — VITAMIN D (ERGOCALCIFEROL) 1.25 MG (50000 UNIT) PO CAPS
50000.0000 [IU] | ORAL_CAPSULE | ORAL | 0 refills | Status: AC
  Filled 2024-09-08: qty 4, 28d supply, fill #0

## 2024-09-08 MED ORDER — LIDOCAINE 5 % EX PTCH
1.0000 | MEDICATED_PATCH | CUTANEOUS | 0 refills | Status: AC
  Filled 2024-09-08: qty 30, 30d supply, fill #0

## 2024-09-08 NOTE — Plan of Care (Signed)

## 2024-09-08 NOTE — TOC Transition Note (Signed)
 Transition of Care [facility]) - Discharge Note   Patient Details  Name: Michael Patton MRN: [id number] Date of Birth: 1956-08-24  [facility]) CM/SW Contact:  Rosalva Jon Bloch, RN Phone Number: 09/08/2024, 12:47 PM   Clinical Narrative:    Patient will DC to: home Anticipated DC date: 09/08/2024 Family notified: yes, brother Transport by: [facility] [employer]  Per MD patient ready for DC today. RN, patient, patient's brother notified of DC. Outpatient PT and OT referral made with [facility]. Information/orders faxed to [phone]. DME : RW and BSC @ bedside to go home with. Pt without RX med concerns. RX meds will be provide to pt prior to d/c from [facility] pharmacy.   CSW setting up transportation to home with [employer].  Post hospital f/u noted on AVS.  RNCM will sign off for now as intervention is no longer needed. Please consult us  again if new needs arise.   Final next level of care: Skilled Nursing Facility Barriers to Discharge: Continued Medical Work up, SNF Pending bed offer   Patient Goals and CMS Choice            Discharge Placement                       Discharge Plan and Services Additional resources added to the After Visit Summary for                                       Social Drivers of Health (SDOH) Interventions SDOH Screenings   Food Insecurity: No Food Insecurity (09/02/2024)  Housing: Low Risk (09/02/2024)  Transportation Needs: Unmet Transportation Needs (09/02/2024)  Utilities: Not At Risk (09/02/2024)  Social Connections: Socially Isolated (09/02/2024)  Tobacco Use: High Risk (08/30/2024)     Readmission Risk Interventions     No data to display

## 2024-09-08 NOTE — Progress Notes (Incomplete)
 "  HD#9 SUBJECTIVE:  Patient Summary: Michael Patton is a 69 y.o. with a pertinent PMH of alcohol use disorder, recurrent falls, COPD, untreated hepatitis C, suspected alcohol and Hep C-related cirrhosis, reported seizure history, who presented with mechanical fall resulting in displaced left femoral neck fracture.  He is now status post left total hip arthroplasty pending home dispo.   Overnight Events: no overnight events.   Interim History:  Patient assessed bedside.  Denies chest pain, shortness of breath, abdominal pain.  Stated that he is having bowel movements.  Complaining of slight soreness in legs after working with physical therapy.  No new change in left hip pain.  Patient aware that he will be discharged home or to brother's house.  Patient stated that he would call brother and give us  update later today.  OBJECTIVE:  Vital Signs: Vitals:   09/07/24 0718 09/07/24 1404 09/07/24 2125 09/08/24 0500  BP: 106/68 119/85 129/86 122/82  Pulse: 85 81 81 74  Resp: 17 18 17 16   Temp: 98.9 F (37.2 C) 98.4 F (36.9 C) 99.9 F (37.7 C) 98.6 F (37 C)  TempSrc: Oral Oral Oral Oral  SpO2: 96% 95% 98% 98%  Weight:      Height:       Supplemental O2: Room Air SpO2: 95 % O2 Flow Rate (L/min):    Filed Weights   08/30/24 1722  Weight: 61 kg     Intake/Output Summary (Last 24 hours) at 09/08/2024 0709 Last data filed at 09/07/2024 2022 Gross per 24 hour  Intake 600 ml  Output 550 ml  Net 50 ml   Net IO Since Admission: 481.33 mL [09/08/24 0709]  Physical Exam: Physical Exam Constitutional:      General: He is not in acute distress.    Appearance: He is not ill-appearing, toxic-appearing or diaphoretic.     Comments: Laying comfortably in bed  Cardiovascular:     Rate and Rhythm: Normal rate and regular rhythm.     Heart sounds: Normal heart sounds. No murmur heard.    No friction rub. No gallop.  Pulmonary:     Effort: Pulmonary effort is normal.     Breath sounds:  Normal breath sounds. No stridor. No wheezing, rhonchi or rales.  Abdominal:     Palpations: Abdomen is soft.     Tenderness: There is no abdominal tenderness.  Musculoskeletal:        General: Swelling (left hip. bandaged, no purulence or drainage noted) present. No tenderness (left hip).     Right lower leg: No edema.     Left lower leg: No edema.     Comments: Moving all extremities.  Sensation intact.    Skin:    General: Skin is warm and dry.     Findings: Bruising (diffusely over upper extremities, left hip bruising.  Nontender to palpation) present.  Neurological:     General: No focal deficit present.     Mental Status: He is alert and oriented to person, place, and time. Mental status is at baseline.     Patient Lines/Drains/Airways Status     Active Line/Drains/Airways     Name Placement date Placement time Site Days   Wound 09/01/24 0949 Surgical Closed Surgical Incision Hip Left 09/01/24  0949  Hip  7            Pertinent labs and imaging:      Latest Ref Rng & Units 09/04/2024    3:53 AM 09/03/2024  7:54 AM 09/02/2024    7:48 AM  CBC  WBC 4.0 - 10.5 K/uL 9.1  8.7  8.5   Hemoglobin 13.0 - 17.0 g/dL 88.7  88.9  89.1   Hematocrit 39.0 - 52.0 % 31.7  30.7  29.9   Platelets 150 - 400 K/uL 105  88  69        Latest Ref Rng & Units 09/04/2024    3:53 AM 09/03/2024    7:54 AM 09/02/2024    7:48 AM  CMP  Glucose 70 - 99 mg/dL 91  886  896   BUN 8 - 23 mg/dL 18  18  16    Creatinine 0.61 - 1.24 mg/dL 9.42  9.44  9.48   Sodium 135 - 145 mmol/L 141  137  133   Potassium 3.5 - 5.1 mmol/L 3.6  3.4  3.8   Chloride 98 - 111 mmol/L 107  103  101   CO2 22 - 32 mmol/L 24  24  23    Calcium 8.9 - 10.3 mg/dL 8.3  8.3  8.1     No results found.   ASSESSMENT/PLAN:  Assessment: Principal Problem:   Closed left femoral fracture (HCC) Active Problems:   Tobacco abuse   Alcohol use disorder, severe, dependence (HCC)   Alcoholic cirrhosis of liver with ascites  (HCC)   COPD (chronic obstructive pulmonary disease) (HCC)   Chronic hepatitis C (HCC)   Fall   Acute delirium   Osteoporosis with current pathological fracture   Vitamin D  deficiency   Plan: #Left femoral neck fracture s/p left total hip arthroplasty Surgery 12/26.  Postop day 6.  Reports bowel movements.  No change in bruising or swelling to left hip.  Nontender to palpation.  Continues to work well with PT (2 bouts of ambulation 15 feet total with 1 seated rest break.  Patient will need to be discharged home.  DME for wheelchair, walker, and bedside commode ordered.  Patient to call brother to see if he can live with him for short while while he continues to recover.  Otherwise he will be discharged to his home at the [place] housing. - DME for wheelchair, walker, bedside commode ordered - Weightbearing as tolerated to left lower extremity - DVT prophylaxis: Aspirin  81 mg twice daily - Bowel regimen: MiraLAX , senna twice daily - Pain control: Tylenol  500 mg 4 times daily, Tylenol  650 mg every 6 hours as needed, lidocaine  patch - Incentive spirometry - Elevate and apply cold therapy. - Follow-up in 2 weeks post-op with Dr. Kendal  # Suspected hospital-acquired delirium - resolved # Fall in hospital No overnight events. - Seroquel  50 mg nightly -- Haldol 1 mg IV or IM or Tablet PRN for agitation -- encouraged patient's brother to have family and friends visit during the day to help with delirium.   # AUD No signs of withdrawal during hospitalization.  - Thiamine  100 mg daily - Folate 1 mg daily - Multivitamin 1 tablet daily - Reglan  5-10 mg every 8 hours - Zofran  4 mg every 8 hours as needed  #Cirrhotic hepatic morphology #Trace RUQ ascites #Chronic untreated hep C Imaging demonstrated cirrhotic hepatic morphology without focal lesions and trace right upper quadrant ascites.  Likely 2/2 chronic alcohol use and untreated hep C.  MELD score 8.  No evidence of variceal bleeding, SBP,  significant ascites.  Labs notable for AST > ALT consistent with alcohol related liver injury. - Hepatitis C labs pending - Can follow-up outpatient with primary care  at [facility] regarding results  #Osteoporosis - Ergocalciferol  1.25 mg q. 7 days  #Chronic medical conditions COPD - Stable Tobacco use disorder - Nicotine  patch 21 mg daily  Best Practice: Diet: Regular diet IVF: Fluids: None, Rate: None VTE: SCDs Start: 09/01/24 1308 Place TED hose Start: 09/01/24 1308 ASA 81 mg BID Code: Full  Disposition planning: DISPO: Home  Signature:  Dr. Sallyanne Primas, DO [facility]  7:09 AM, 09/08/2024  On Call pager [phone]  "

## 2024-09-08 NOTE — Progress Notes (Signed)
 PT Cancellation Note  Patient Details Name: Michael Patton MRN: [id number] DOB: Jul 22, 1956   Cancelled Treatment:    Reason Eval/Treat Not Completed: (P) Other (comment);Patient declined, no reason specified (pt defers, stating I want to know what the plan is today about my discharge, I can't do PT right now, my brother might call back soon.) Will continue efforts per PT plan of care as schedule permits.   Connell HERO [name] 09/08/2024, 10:36 AM

## 2024-09-08 NOTE — Discharge Summary (Addendum)
 "  Name: Michael Patton MRN: [id number] DOB: 06-Jan-1956 69 y.o. PCP: [facility]  Date of Admission: 08/30/2024  5:05 PM Date of Discharge: 09/08/24 Attending Physician: Dr. Reyes Fenton  Discharge Diagnosis: 1. Principal Problem:   Closed left femoral fracture (HCC) Active Problems:   Tobacco abuse   Alcohol use disorder, severe, dependence (HCC)   Alcoholic cirrhosis of liver with ascites (HCC)   COPD (chronic obstructive pulmonary disease) (HCC)   Chronic hepatitis C (HCC)   Fall   Acute delirium   Osteoporosis with current pathological fracture   Vitamin D  deficiency  Discharge Medications: Allergies as of 09/08/2024       Reactions   Sertraline Rash        Medication List     TAKE these medications    acetaminophen  500 MG tablet Commonly known as: TYLENOL  Take 1,000 mg by mouth every 6 (six) hours as needed for headache or mild pain (pain score 1-3).   aspirin  81 MG chewable tablet Chew 1 tablet (81 mg total) by mouth 2 (two) times daily for 25 days.   carboxymethylcellulose 0.5 % Soln Commonly known as: REFRESH PLUS Place 2 drops into both eyes daily as needed (dry eyes).   folic acid  1 MG tablet Commonly known as: FOLVITE  Take 1 tablet (1 mg total) by mouth daily.   lidocaine  5 % Commonly known as: LIDODERM  Place 1 patch onto the skin daily. Remove & Discard patch within 12 hours or as directed by MD   multivitamin with minerals Tabs tablet Take 1 tablet by mouth daily.   nicotine  21 mg/24hr patch Commonly known as: NICODERM CQ  - dosed in mg/24 hours Place 1 patch (21 mg total) onto the skin daily.   thiamine  100 MG tablet Commonly known as: VITAMIN B1 Take 1 tablet (100 mg total) by mouth daily.   Vitamin D  (Ergocalciferol ) 1.25 MG (50000 UNIT) Caps capsule Commonly known as: DRISDOL  Take 1 capsule (50,000 Units total) by mouth every 7 (seven) days. Start taking on: September 09, 2024               Durable Medical  Equipment  (From admission, onward)           Start     Ordered   09/06/24 1426  For home use only DME standard manual wheelchair with seat cushion  Once       Comments: Patient suffers from femoral neck fx s/p left total knee arthroplasty which impairs their ability to perform daily activities like bathing, dressing, feeding, grooming, and toileting in the home.  A walker will not resolve issue with performing activities of daily living. A wheelchair will allow patient to safely perform daily activities. Patient can safely propel the wheelchair in the home or has a caregiver who can provide assistance. Length of need 6 months . Accessories: elevating leg rests (ELRs), wheel locks, extensions and anti-tippers.   09/06/24 1426   09/06/24 1405  For home use only DME Walker rolling  Once       Question Answer Comment  Walker: With 5 Inch Wheels   Patient needs a walker to treat with the following condition Femur fracture, left (HCC)      09/06/24 1404              Discharge Care Instructions  (From admission, onward)           Start     Ordered   09/08/24 0000  Leave dressing on - Keep  it clean, dry, and intact until clinic visit        09/08/24 1120   09/06/24 0000  Change dressing (specify)       Comments: Dressing change: once per day   09/06/24 1242           Disposition and follow-up:   Michael Patton was discharged from [facility] in Stable condition.  At the hospital follow up visit please address:  S/p left total hip arthoplasty: Ensure adherence to ASA 81 mg BID to complete one month, per ortho surgery. Ensure f/u with orthopedic surgery. Ensure adherence with proper conditioning and therapy. Assess for recent falls.  2. Hallucinations: Likely 2/2 hospital acquired delirium. 3. AUD: encourage cessation of alcohol  4. Cirrhosis & elevated LFTs: follow up on liver function labs, Hep A & B lab results, CBC and BMP.  Follow-up  Appointments:  Follow-up Information     Haddix, Franky SQUIBB, MD. Schedule an appointment as soon as possible for a visit in 2 week(s).   Specialty: Orthopedic Surgery Why: for wound check and repeat x-rays Contact information: [address] [place] [address] [phone]         [facility].   Contact information: [address] [facility] [place] [address] [phone]         [facility] (DME) Follow up.   Specialty: DME Services Contact information: [address] [place]  [address] [phone]        [facility] Follow up.   Why: Referral made for outpatient PT and OT services. Please call and arrange appointment time. Contact information: [address] [place], [place] [address] [phone]               Hospital Course by problem list: Michael Patton is a 69 y.o. person who presented to ED 08/30/24 with left femoral neck fracture from a fall. Underwant L THA on 09/01/24, now being discharged on hospital day 9 with the following pertinent hospital course:  #Fall #Proximal femoral fracture #Left Elbow wound 2/2 fall Patient is a 69 year old male with a history of alcohol use disorder and falls presenting after an acute impacted fracture of the proximal left femur following a mechanical fall in the setting of chronic alcohol use and recurrent falls. CT head on admission negative for intracranial bleed and CT cervical spine is negative.  Orthopedic surgery was consulted and patient received total left hip arthroplasty.  Postoperatively, stay was complicated by another fall (see below).  Repeat x-ray of hip did not show changes to surgical site.  Pain is well-controlled with Tylenol .  Recommendations per orthopedic surgery included aspirin  81 mg twice daily for DVT prophylaxis, weightbearing as tolerated, elevation and cold therapy. Sent home  with brother and HH.   #Fall in hospital Patient had a fall 12/28 AM that resulted in quarter sized bruise on left forehead, bleeding on right hand.  Per notation, night team received page from nursing that he was found on the floor and circumstances surrounding the fall are unclear. Surgical site after fall was noted to be clean, dry, intact without other bruising or injuries observed.  Physical exam 12/28, significant bruising present along the lateral hip, however was nontender to palpation.  No focal deficits noted.  Denied headache, endorsed slight facial jaw pain on left side. Immediately after fall, he was oriented only to name, but during examination later in the day, he  was alert and oriented x 3 and appeared back at baseline. CT head unremarkable for acute intracranial abnormality.  Did not appear to have received medication management with Dilaudid  or morphine  12/17.  More likely that he has hospital-acquired delirium not driven by medication management.   #Hospital-acquired delirium Fall on 12/28 likely 2/2 hospital acquired delirium.  Low suspicion for alcohol withdrawal or medications causes.  On 12/29 around 5:30 PM, on-call team received notification from RN that patient was try to get out of bed stating he saw 3 Joker's that had escaped at the window and saw armed officers as well.  He stated roommate was recording the whole thing.  Patient was not able to be reduced directed and Seroquel  25 mg was added for bedtime.  Patient was amenable to taking this medication.  Unfortunately further on in the night around 3-7 AM, patient was found very confused talking about a young male and young boys in his room trying to start a fire, a ghost, being in a restaurant, being at home, and other police officers.  When assessing the patient bedside around 7 AM, patient was not endorsing hallucinations, alert and oriented to self, location, situation, year and vocalized wanting to leave the hospital due  to nobody believing him seeing these things, the medications that he was receiving, and feeling like he could get better help at home. I believe the hallucinations are hospital-acquired delirium as they are waxing and waning and this patient does not have recorded history of previous psychiatric illness or concerns in hospital in chart or per brother. Called patient's brother around 96 on 12/30.  Patient's brother stated that his brother does not have people to care for him at home as he lives in [place] housing with 6 roommates. Patient seen bedside with brother and after discussion of needing SNF, patient agreeable. Ordered nightly seroquel  which patient refused and PRN haldol.   ##Alcohol use disorder #High Risk for Alcohol withdrawal  Chronic alcohol use disorder with daily consumption of approximately 6-7 twelve-ounce beers for the past four years. 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 On admission, the patient was severely acutely intoxicated (blood alcohol level 307 mg/dL).  On admission he denied withdrawal symptoms.  He was placed on CIWA with Ativan .  Received thiamine , folate, multivitamin, Zofran  as needed, Reglan .  #Cirrhotic hepatic morphology with no evidence of focal hepatic lesion #Trace Perry cholecystic fluid/right upper quadrant ascites Cirrhotic hepatic morphology on RUQ ultrasound without focal hepatic lesions, likely secondary to chronic alcohol use. Likely compensated cirrhosis with MELD score of 8 and no clinical evidence of significant ascites, hepatic encephalopathy, variceal bleeding, or spontaneous bacterial peritonitis. Trace right upper quadrant ascites noted on imaging. Prior plan for EGD/colonoscopy in 2022, unclear if completed.Laboratory studies show AST 94 and ALT 38, consistent with alcohol-induced liver injury. Physical exam shows mild abdominal distension without signs of significant ascites or right upper  quadrant tenderness.  Hepatitis A & B labs ordered prior to discharge.  Encouraged follow-up with primary care doctor.  #Hx of untreated Hep C He does not have active Hep C, most likely exposed and cleared. Screen for Hep A/B.    #Hyponatremia  The patient has a sodium level of 127 on admission.  Chart review suggests he may have been chronically in the 120-130s, though available data only goes back three years. Given his chronic alcohol use, hyponatremia is likely secondary to beer potomania.  Resolved by day of discharge (sodium 141).  Chronic medical conditions: - COPD - Tobacco use disorder  Subjective Patient assessed bedside. No acute concerns. No CP, SOB, abdominal pain. Had BM 12/30. No urinary concerns.  Patient was concerned with discoloration over surgery site, though discussed with patient and provided reassurance significant bruising will persist for quite a while for it improves.  Was able to get in contact with patient's brother and arrange for transportation for patient to go home with his brother today.  Discharge Exam:   BP 133/76 (BP Location: Right Arm)   Pulse 79   Temp 99.2 F (37.3 C) (Oral)   Resp 16   Ht 5' 9 (1.753 m)   Wt 61 kg   SpO2 97%   BMI 19.86 kg/m  Discharge exam:  General: Laying comfortably in bed, NAD Cardiovascular: RRR, no murmurs, gallops or rubs heard Respiratory: Normal effort, CTAB ABD: Soft, nontender, nondistended MSK: Left hip bandaged with ecchymosis, no purulence or drainage noted, mildly tender  Pertinent Labs, Studies, and Procedures:     Latest Ref Rng & Units 09/04/2024    3:53 AM 09/03/2024    7:54 AM 09/02/2024    7:48 AM  CBC  WBC 4.0 - 10.5 K/uL 9.1  8.7  8.5   Hemoglobin 13.0 - 17.0 g/dL 88.7  88.9  89.1   Hematocrit 39.0 - 52.0 % 31.7  30.7  29.9   Platelets 150 - 400 K/uL 105  88  69        Latest Ref Rng & Units 09/04/2024    3:53 AM 09/03/2024    7:54 AM 09/02/2024    7:48 AM  CMP  Glucose 70 - 99 mg/dL  91  886  896   BUN 8 - 23 mg/dL 18  18  16    Creatinine 0.61 - 1.24 mg/dL 9.42  9.44  9.48   Sodium 135 - 145 mmol/L 141  137  133   Potassium 3.5 - 5.1 mmol/L 3.6  3.4  3.8   Chloride 98 - 111 mmol/L 107  103  101   CO2 22 - 32 mmol/L 24  24  23    Calcium 8.9 - 10.3 mg/dL 8.3  8.3  8.1     US  Abdomen Limited RUQ (LIVER/GB) Result Date: 08/30/2024 CLINICAL DATA:  Alcohol abuse with withdrawal. EXAM: ULTRASOUND ABDOMEN LIMITED RIGHT UPPER QUADRANT COMPARISON:  None Available. FINDINGS: Gallbladder: Physiologically distended. No gallstones. Mild circumferential wall thickening at 4 mm. Minimal pericholecystic fluid/right upper quadrant ascites. No sonographic Murphy sign noted by sonographer. Common bile duct: Diameter: 3 mm, normal. Liver: Heterogeneous and increased parenchymal echogenicity. Subtle capsular nodularity. No evidence of discrete focal lesion. Portal vein is patent on color Doppler imaging with normal direction of blood flow towards the liver. Other: Trace right upper quadrant ascites. IMPRESSION: 1. Cirrhotic hepatic morphology. No evidence of focal hepatic lesion. 2. Diffuse gallbladder wall thickening is likely related to chronic liver disease. No gallstones. 3. Trace pericholecystic fluid/right upper quadrant ascites. Electronically Signed   By: Andrea Gasman M.D.   On: 08/30/2024 21:46   CT HIP LEFT WO CONTRAST Result Date: 08/30/2024 CLINICAL DATA:  Status post trauma. EXAM: CT OF THE LEFT HIP WITHOUT CONTRAST TECHNIQUE: Multidetector CT imaging of the left hip was performed according to the standard protocol. Multiplanar CT image reconstructions were also generated. RADIATION DOSE REDUCTION: This exam was performed according to the departmental dose-optimization program which includes automated exposure control, adjustment of the mA and/or kV according to patient size and/or use of iterative  reconstruction technique. COMPARISON:  None Available. FINDINGS: Bones/Joint/Cartilage  Acute, mildly impacted fracture deformity is seen extending through the head and neck of the proximal left femur. There is no evidence of dislocation. Degenerative changes are seen in the form of joint space narrowing and acetabular sclerosis. Ligaments Suboptimally assessed by CT. Muscles and Tendons Limited in evaluation and otherwise unremarkable. Soft tissues The urinary bladder is markedly distended and is otherwise unremarkable. Moderate to marked severity prostate gland calcification is seen. IMPRESSION: Acute, mildly impacted fracture of the proximal left femur. Electronically Signed   By: Suzen Dials M.D.   On: 08/30/2024 18:30   CT Cervical Spine Wo Contrast Result Date: 08/30/2024 CLINICAL DATA:  Status post trauma. EXAM: CT CERVICAL SPINE WITHOUT CONTRAST TECHNIQUE: Multidetector CT imaging of the cervical spine was performed without intravenous contrast. Multiplanar CT image reconstructions were also generated. RADIATION DOSE REDUCTION: This exam was performed according to the departmental dose-optimization program which includes automated exposure control, adjustment of the mA and/or kV according to patient size and/or use of iterative reconstruction technique. COMPARISON:  July 25, 2017 FINDINGS: Alignment: There is approximately 1 mm retrolisthesis of the C5 vertebral body on C6. Skull base and vertebrae: No acute fracture. No primary bone lesion or focal pathologic process. Soft tissues and spinal canal: No prevertebral fluid or swelling. No visible canal hematoma. Disc levels: Moderate severity endplate sclerosis, mild anterior osteophyte formation and mild to moderate severity posterior bony spurring are seen at the levels of C5-C6 and C6-C7. There is marked severity intervertebral disc space narrowing at the levels of C5-C6 and C6-C7. Bilateral marked severity multilevel facet joint hypertrophy is noted. Upper chest: Mild biapical scarring and/or atelectasis is seen. Other: None.  IMPRESSION: 1. No acute fracture or traumatic subluxation of the cervical spine. 2. Marked severity degenerative changes at the levels of C5-C6 and C6-C7. Electronically Signed   By: Suzen Dials M.D.   On: 08/30/2024 18:22   CT Head Wo Contrast Result Date: 08/30/2024 CLINICAL DATA:  Status post trauma. EXAM: CT HEAD WITHOUT CONTRAST TECHNIQUE: Contiguous axial images were obtained from the base of the skull through the vertex without intravenous contrast. RADIATION DOSE REDUCTION: This exam was performed according to the departmental dose-optimization program which includes automated exposure control, adjustment of the mA and/or kV according to patient size and/or use of iterative reconstruction technique. COMPARISON:  May 23, 2021 FINDINGS: Brain: There is generalized cerebral atrophy with widening of the extra-axial spaces and ventricular dilatation. There are areas of decreased attenuation within the white matter tracts of the supratentorial brain, consistent with microvascular disease changes. Vascular: Moderate severity bilateral cavernous carotid artery calcification is noted. Skull: Normal. Negative for fracture or focal lesion. Sinuses/Orbits: No acute finding. Other: None. IMPRESSION: 1. Generalized cerebral atrophy and microvascular disease changes of the supratentorial brain. 2. No acute intracranial abnormality. Electronically Signed   By: Suzen Dials M.D.   On: 08/30/2024 18:18   DG Hip Unilat W or Wo Pelvis 2-3 Views Left Result Date: 08/30/2024 CLINICAL DATA:  Status post fall. EXAM: DG HIP (WITH OR WITHOUT PELVIS) 2-3V LEFT COMPARISON:  None Available. FINDINGS: An acute fracture deformity is seen extending through the neck of the proximal left femur. There is no evidence of dislocation. Mild degenerative changes are present in the form of joint space narrowing and acetabular sclerosis. IMPRESSION: Acute fracture of the proximal left femur. Electronically Signed   By:  Suzen Dials M.D.   On: 08/30/2024 18:05   DG Chest 2  View Result Date: 08/30/2024 CLINICAL DATA:  Status post fall. EXAM: DG CHEST 2V COMPARISON:  November 12, 2020 FINDINGS: The heart size and mediastinal contours are within normal limits. There is moderate severity calcification of the aortic arch. Both lungs are clear. The visualized skeletal structures are unremarkable. IMPRESSION: No active cardiopulmonary disease. Electronically Signed   By: Suzen Dials M.D.   On: 08/30/2024 18:04   DG Elbow Complete Left Result Date: 08/30/2024 CLINICAL DATA:  Syncopal episode with subsequent collapse. EXAM: LEFT ELBOW - COMPLETE 3+ VIEW COMPARISON:  None Available. FINDINGS: There is no evidence of fracture, dislocation, or joint effusion. There is no evidence of arthropathy or other focal bone abnormality. Soft tissues are unremarkable. IMPRESSION: Negative. Electronically Signed   By: Suzen Dials M.D.   On: 08/30/2024 18:03     Discharge Instructions: Discharge Instructions     Ambulatory referral to Occupational Therapy   Complete by: As directed    Ambulatory referral to Physical Therapy   Complete by: As directed    Call MD for:  persistant nausea and vomiting   Complete by: As directed    Call MD for:  redness, tenderness, or signs of infection (pain, swelling, redness, odor or green/yellow discharge around incision site)   Complete by: As directed    Call MD for:  severe uncontrolled pain   Complete by: As directed    Call MD for:  temperature >100.4   Complete by: As directed    Change dressing (specify)   Complete by: As directed    Dressing change: once per day   Diet general   Complete by: As directed    Discharge instructions   Complete by: As directed    Franky Light, MD Lauraine Moores PA-C [facility] [address] [place], [place] [address] [phone] (tel)   [phone] (fax)   TOTAL HIP REPLACEMENT POSTOPERATIVE DIRECTIONS      Hip Rehabilitation, Guidelines Following Surgery   WEIGHT BEARING Weight bearing as tolerated with assist device (walker, cane, etc) as directed, use it as long as suggested by your surgeon or therapist, typically at least 4-6 weeks.  The results of a hip operation are greatly improved after range of motion and muscle strengthening exercises. Follow all safety measures which are given to protect your hip. If any of these exercises cause increased pain or swelling in your joint, decrease the amount until you are comfortable again. Then slowly increase the exercises. Call your caregiver if you have problems or questions.   HOME CARE INSTRUCTIONS  Most of the following instructions are designed to prevent the dislocation of your new hip.   Remove items at home which could result in a fall. This includes throw rugs or furniture in walking pathways.   Continue medications as instructed at time of discharge.  ? You may have some home medications which will be placed on hold until you complete the course of blood thinner medication.  ? You may start showering once you are discharged home. Do not remove your dressing.  Do not put on socks or shoes without following the instructions of your caregivers.    Sit on chairs with arms. Use the chair arms to help push yourself up when arising.   Arrange for the use of a toilet seat elevator so you are not sitting low.   ? Walk with walker as instructed.   You may resume a sexual relationship in one month or when given the OK by your caregiver.  Use walker as long as suggested by your caregivers.   You may put full weight on your legs and walk as much as is comfortable.  Avoid periods of inactivity such as sitting longer than an hour when not asleep. This helps prevent blood clots.   You may return to work once you are cleared by designer, industrial/product.   Do not drive a car for 6 weeks or until released by your surgeon.   Do not drive while taking  narcotics.   Wear elastic stockings for two weeks following surgery during the day but you may remove then at night.   Make sure you keep all of your appointments after your operation with all of your doctors and caregivers. You should call the office at the above phone number and make an appointment for approximately two weeks after the date of your surgery.  Please pick up a stool softener and laxative for home use as long as you are requiring pain medications.  ? ICE to the affected hip every three hours for 30 minutes at a time and then as needed for pain and swelling. Continue to use ice on the hip for pain and swelling from surgery. You may notice swelling that will progress down to the foot and ankle.  This is normal after surgery.  Elevate the leg when you are not up walking on it.    It is important for you to complete the blood thinner medication as prescribed by your doctor.  ? Continue to use the breathing machine which will help keep your temperature down.  It is common for your temperature to cycle up and down following surgery, especially at night when you are not up moving around and exerting yourself.  The breathing machine keeps your lungs expanded and your temperature down.  RANGE OF MOTION AND STRENGTHENING EXERCISES  These exercises are designed to help you keep full movement of your hip joint. Follow your caregiver's or physical therapist's instructions. Perform all exercises about fifteen times, three times per day or as directed. Exercise both hips, even if you have had only one joint replacement. These exercises can be done on a training (exercise) mat, on the floor, on a table or on a bed. Use whatever works the best and is most comfortable for you. Use music or television while you are exercising so that the exercises are a pleasant break in your day. This will make your life better with the exercises acting as a break in routine you can look forward to.   Lying on your  back, slowly slide your foot toward your buttocks, raising your knee up off the floor. Then slowly slide your foot back down until your leg is straight again.   Lying on your back spread your legs as far apart as you can without causing discomfort.   Lying on your side, raise your upper leg and foot straight up from the floor as far as is comfortable. Slowly lower the leg and repeat.   Lying on your back, tighten up the muscle in the front of your thigh (quadriceps muscles). You can do this by keeping your leg straight and trying to raise your heel off the floor. This helps strengthen the largest muscle supporting your knee.   Lying on your back, tighten up the muscles of your buttocks both with the legs straight and with the knee bent at a comfortable angle while keeping your heel on the floor.   SKILLED REHAB INSTRUCTIONS: If the  patient is transferred to a skilled rehab facility following release from the hospital, a list of the current medications will be sent to the facility for the patient to continue.  When discharged from the skilled rehab facility, please have the facility set up the patient's Home Health Physical Therapy prior to being released. Also, the skilled facility will be responsible for providing the patient with their medications at time of release from the facility to include their pain medication and their blood thinner medication. If the patient is still at the rehab facility at time of the two week follow up appointment, the skilled rehab facility will also need to assist the patient in arranging follow up appointment in our office and any transportation needs.  POST-OPERATIVE OPIOID TAPER INSTRUCTIONS:  It is important to wean off of your opioid medication as soon as possible. If you do not need pain medication after your surgery it is ok to stop day one.  Opioids include:  o Codeine, Hydrocodone (Norco, Vicodin), Oxycodone (Percocet, oxycontin ) and hydromorphone  amongst  others.   Long term and even short term use of opiods can cause:  o Increased pain response  o Dependence  o Constipation  o Depression  o Respiratory depression  o And more.   Withdrawal symptoms can include  o Flu like symptoms  o Nausea, vomiting  o And more  Techniques to manage these symptoms  o Hydrate well  o Eat regular healthy meals  o Stay active  o Use relaxation techniques(deep breathing, meditating, yoga)  Do Not substitute Alcohol to help with tapering  If you have been on opioids for less than two weeks and do not have pain than it is ok to stop all together.   Plan to wean off of opioids  o This plan should start within one week post op of your joint replacement.  o Maintain the same interval or time between taking each dose and first decrease the dose.   o Cut the total daily intake of opioids by one tablet each day  o Next start to increase the time between doses.  o The last dose that should be eliminated is the evening dose.    MAKE SURE YOU:   Understand these instructions.   Will watch your condition.   Will get help right away if you are not doing well or get worse.  Pick up stool softner and laxative for home use following surgery while on pain medications. Do not remove your dressing. The dressing is waterproof--it is OK to take showers. Continue to use ice for pain and swelling after surgery. Do not use any lotions or creams on the incision until instructed by your surgeon. Total Hip Protocol.  Thank you for allowing us  to be part of your care. You were hospitalized for fall. 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 You are going to a medical center to work with physical therapy before you go home called a Skilled Nursing Facility.   See the changes in your medications and management of your chronic conditions below:  *For your recovery postsurgery - Continue taking aspirin  81 mg twice daily for 1 month - Continue weightbearing  as tolerated with help from family/friends - Be cautious as you are at increased risk for falls - You can continue Tylenol  650 mg every 6 hours as needed for pain.  You can continue to elevate and ice your hip. - Home health was ordered to help with recovery at home.  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  You received medications such as multivitamin, thiamine , folate.  I would encourage you to continue stopping drinking. - Continue taking vitamin D  capsule weekly, and thiamine  daily, and multivitamin daily  *For your hepatitis -Please follow-up with your primary care doctor regarding your hepatitis C labs  - Please see primary care doctor in [place] in 7 to 10 days - Follow-up with Dr. Kendal with orthopedic surgery in 2 weeks.  FOLLOW UP APPOINTMENTS: - Please see primary care doctor in [place] in 7 to 10 days - Follow-up with Dr. Kendal with orthopedic surgery in 2 weeks.  Please make sure to   Please call your PCP or our clinic if you have any questions or concerns, we may be able to help and keep you from a long and expensive emergency room wait. Our clinic and after hours phone number is [phone]. The best time to call is Monday through Friday 9 am to 4 pm but there is always someone available 24/7 if you have an emergency. If you need medication refills please notify your pharmacy one week in advance and they will send us  a request.   We are glad you are feeling better,  Alfornia Light, DO Internal Medicine Inpatient Teaching Service at [facility]   Increase activity slowly   Complete by: As directed    Increase activity slowly   Complete by: As directed    Leave dressing on - Keep it clean, dry, and intact until clinic visit   Complete by: As directed       Signed: Light [place], DO 09/08/2024, 1:52 PM   "

## 2024-09-08 NOTE — TOC Progression Note (Addendum)
 Transition of Care [facility]) - Progression Note    Patient Details  Name: Michael Patton MRN: [id number] Date of Birth: [birth year]  [facility] CM/SW Contact  Bridget Cordella Simmonds, LCSW Phone Number: 09/08/2024, 1:34 PM  Clinical Narrative:   CSW spoke with pt brother Michael Patton who confirmed pt can discharge to his home today.  Address confirmed: [address] [place], [place] [address].  Michael Patton is not able to come to [place] today because his wife has the car and is at work, he would be able to come pick pt up on Monday.  Discussed outpt PT: Michael Patton will figure out a way to get him to PT, preference would be in [facility], second choice would be [place].  CSW spoke with [facility]: they are able to transport pt to [facility], referral made.   1335: [facility] called, will be here shortly.  RN aware.   Expected Discharge Plan: Skilled Nursing Facility Barriers to Discharge: Continued Medical Work up, SNF Pending bed offer               Expected Discharge Plan and Services       Living arrangements for the past 2 months: Skilled Nursing Facility Expected Discharge Date: 09/08/24                                     Social Drivers of Health (SDOH) Interventions SDOH Screenings   Food Insecurity: No Food Insecurity (09/02/2024)  Housing: Low Risk (09/02/2024)  Transportation Needs: Unmet Transportation Needs (09/02/2024)  Utilities: Not At Risk (09/02/2024)  Social Connections: Socially Isolated (09/02/2024)  Tobacco Use: High Risk (08/30/2024)    Readmission Risk Interventions     No data to display

## 2024-09-09 ENCOUNTER — Other Ambulatory Visit (HOSPITAL_COMMUNITY): Payer: Self-pay

## 2024-10-04 ENCOUNTER — Telehealth: Payer: Self-pay | Admitting: *Deleted

## 2024-10-04 NOTE — Telephone Encounter (Signed)
 Received referral from [facility] [phone] telephone  Reason for referral: fracture of femoral neck  Referral denied as patient requires referral to orthopedic surgery.
